# Patient Record
Sex: Male | Born: 1975 | Race: Black or African American | Hispanic: No | Smoking: Never smoker
Health system: Southern US, Community
[De-identification: ages and names within clinical notes are randomized; demographics above are authoritative.]

## PROBLEM LIST (undated history)

## (undated) DIAGNOSIS — E785 Hyperlipidemia, unspecified: Secondary | ICD-10-CM

## (undated) DIAGNOSIS — G4730 Sleep apnea, unspecified: Secondary | ICD-10-CM

## (undated) DIAGNOSIS — R0902 Hypoxemia: Secondary | ICD-10-CM

## (undated) DIAGNOSIS — I459 Conduction disorder, unspecified: Secondary | ICD-10-CM

## (undated) DIAGNOSIS — B019 Varicella without complication: Secondary | ICD-10-CM

## (undated) DIAGNOSIS — I509 Heart failure, unspecified: Secondary | ICD-10-CM

## (undated) DIAGNOSIS — I429 Cardiomyopathy, unspecified: Secondary | ICD-10-CM

## (undated) DIAGNOSIS — K219 Gastro-esophageal reflux disease without esophagitis: Secondary | ICD-10-CM

## (undated) DIAGNOSIS — I495 Sick sinus syndrome: Secondary | ICD-10-CM

## (undated) DIAGNOSIS — I5022 Chronic systolic (congestive) heart failure: Secondary | ICD-10-CM

## (undated) DIAGNOSIS — R0602 Shortness of breath: Secondary | ICD-10-CM

## (undated) DIAGNOSIS — G4733 Obstructive sleep apnea (adult) (pediatric): Secondary | ICD-10-CM

## (undated) DIAGNOSIS — E119 Type 2 diabetes mellitus without complications: Secondary | ICD-10-CM

## (undated) DIAGNOSIS — N179 Acute kidney failure, unspecified: Secondary | ICD-10-CM

## (undated) DIAGNOSIS — E876 Hypokalemia: Secondary | ICD-10-CM

## (undated) DIAGNOSIS — I10 Essential (primary) hypertension: Secondary | ICD-10-CM

## (undated) DIAGNOSIS — R51 Headache: Secondary | ICD-10-CM

## (undated) DIAGNOSIS — R000 Tachycardia, unspecified: Secondary | ICD-10-CM

## (undated) DIAGNOSIS — R519 Headache, unspecified: Secondary | ICD-10-CM

## (undated) HISTORY — PX: ABDOMINAL SURGERY: SHX537

## (undated) HISTORY — DX: Varicella without complication: B01.9

## (undated) HISTORY — DX: Sleep apnea, unspecified: G47.30

## (undated) HISTORY — DX: Hyperlipidemia, unspecified: E78.5

## (undated) HISTORY — PX: APPENDECTOMY: SHX54

---
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
## 2001-09-19 ENCOUNTER — Encounter: Payer: Self-pay | Admitting: Emergency Medicine

## 2001-09-19 ENCOUNTER — Emergency Department (HOSPITAL_COMMUNITY): Admission: EM | Admit: 2001-09-19 | Discharge: 2001-09-19 | Payer: Self-pay | Admitting: Emergency Medicine

## 2002-06-12 ENCOUNTER — Emergency Department (HOSPITAL_COMMUNITY): Admission: EM | Admit: 2002-06-12 | Discharge: 2002-06-12 | Payer: Self-pay | Admitting: Emergency Medicine

## 2003-10-15 ENCOUNTER — Emergency Department (HOSPITAL_COMMUNITY): Admission: EM | Admit: 2003-10-15 | Discharge: 2003-10-15 | Payer: Self-pay | Admitting: Emergency Medicine

## 2012-11-02 ENCOUNTER — Telehealth: Payer: Self-pay | Admitting: Neurology

## 2012-11-02 DIAGNOSIS — IMO0001 Reserved for inherently not codable concepts without codable children: Secondary | ICD-10-CM

## 2012-11-02 DIAGNOSIS — G4710 Hypersomnia, unspecified: Secondary | ICD-10-CM

## 2012-11-02 DIAGNOSIS — I10 Essential (primary) hypertension: Secondary | ICD-10-CM

## 2012-11-02 NOTE — Telephone Encounter (Signed)
This patient qualifies for attended sleep study based on clinical history. Witnessed apneas .  Patient will need a SPLIT at AHI 10 and 3% scoring.  Offer lunesta prn,  based on BMI  , measuring of CO2 necessary. CD

## 2012-11-02 NOTE — Telephone Encounter (Signed)
Dr. Alysia Penna is referring this patient for evaluation of sleep apnea.  Wt. 307 lbs. Ht. 71.75 in.  BMI 41.93  Morbid Obesity HTN Witnessed Apnea Hyperlipidemia  Medications Vitamin D 2000 Unit Caps Lisinopril 20 MG Glucophage 500 MG Pravastatin Sodium 40 MG Hydrochlorothiazide 25 MG Flagyl 500 MG  Dr. Link Snuffer requests patient be evaluated for sleep apnea.  He has witnessed episodes of apnea at night. He endorses Epworth at 15 and has  excessive daytime sleepiness/fatigue.   Insurance:  [employer]

## 2012-11-04 NOTE — Addendum Note (Signed)
Addended by: Melvyn Novas on: 11/04/2012 05:34 PM   Modules accepted: Orders

## 2012-11-08 ENCOUNTER — Telehealth: Payer: Self-pay | Admitting: *Deleted

## 2012-11-08 NOTE — Telephone Encounter (Signed)
Message left that we received a referral for a sleep study.  Please call to schedule.

## 2012-11-18 ENCOUNTER — Telehealth: Payer: Self-pay | Admitting: Neurology

## 2012-11-18 NOTE — Telephone Encounter (Signed)
Called the patient and left a message to call back regarding scheduling his sleep study appointment.

## 2013-01-28 ENCOUNTER — Encounter: Payer: Self-pay | Admitting: Neurology

## 2013-07-07 HISTORY — PX: CARDIAC CATHETERIZATION: SHX172

## 2013-07-22 ENCOUNTER — Emergency Department (HOSPITAL_BASED_OUTPATIENT_CLINIC_OR_DEPARTMENT_OTHER): Payer: Self-pay

## 2013-07-22 ENCOUNTER — Encounter (HOSPITAL_BASED_OUTPATIENT_CLINIC_OR_DEPARTMENT_OTHER): Payer: Self-pay | Admitting: Emergency Medicine

## 2013-07-22 ENCOUNTER — Inpatient Hospital Stay (HOSPITAL_BASED_OUTPATIENT_CLINIC_OR_DEPARTMENT_OTHER)
Admission: EM | Admit: 2013-07-22 | Discharge: 2013-07-28 | DRG: 287 | Disposition: A | Discharge status: Home / Self Care | Payer: Self-pay | Attending: Internal Medicine | Admitting: Internal Medicine

## 2013-07-22 DIAGNOSIS — I495 Sick sinus syndrome: Secondary | ICD-10-CM

## 2013-07-22 DIAGNOSIS — R0602 Shortness of breath: Secondary | ICD-10-CM

## 2013-07-22 DIAGNOSIS — I10 Essential (primary) hypertension: Secondary | ICD-10-CM

## 2013-07-22 DIAGNOSIS — I459 Conduction disorder, unspecified: Secondary | ICD-10-CM

## 2013-07-22 DIAGNOSIS — N179 Acute kidney failure, unspecified: Secondary | ICD-10-CM

## 2013-07-22 DIAGNOSIS — R0902 Hypoxemia: Secondary | ICD-10-CM

## 2013-07-22 DIAGNOSIS — E876 Hypokalemia: Secondary | ICD-10-CM

## 2013-07-22 DIAGNOSIS — R000 Tachycardia, unspecified: Secondary | ICD-10-CM

## 2013-07-22 DIAGNOSIS — I5021 Acute systolic (congestive) heart failure: Secondary | ICD-10-CM

## 2013-07-22 DIAGNOSIS — I509 Heart failure, unspecified: Secondary | ICD-10-CM | POA: Diagnosis present

## 2013-07-22 DIAGNOSIS — I110 Hypertensive heart disease with heart failure: Principal | ICD-10-CM | POA: Diagnosis present

## 2013-07-22 DIAGNOSIS — I429 Cardiomyopathy, unspecified: Secondary | ICD-10-CM

## 2013-07-22 DIAGNOSIS — E119 Type 2 diabetes mellitus without complications: Secondary | ICD-10-CM

## 2013-07-22 HISTORY — DX: Essential (primary) hypertension: I10

## 2013-07-22 LAB — CBC
HCT: 41.3 % (ref 39.0–52.0)
Hemoglobin: 13.6 g/dL (ref 13.0–17.0)
MCH: 30 pg (ref 26.0–34.0)
MCHC: 32.9 g/dL (ref 30.0–36.0)
MCV: 91 fL (ref 78.0–100.0)
PLATELETS: 238 10*3/uL (ref 150–400)
RBC: 4.54 MIL/uL (ref 4.22–5.81)
RDW: 13.1 % (ref 11.5–15.5)
WBC: 12.1 10*3/uL — AB (ref 4.0–10.5)

## 2013-07-22 LAB — COMPREHENSIVE METABOLIC PANEL
ALT: 43 U/L (ref 0–53)
AST: 30 U/L (ref 0–37)
Albumin: 3.9 g/dL (ref 3.5–5.2)
Alkaline Phosphatase: 65 U/L (ref 39–117)
BUN: 18 mg/dL (ref 6–23)
CHLORIDE: 100 meq/L (ref 96–112)
CO2: 27 meq/L (ref 19–32)
CREATININE: 1.4 mg/dL — AB (ref 0.50–1.35)
Calcium: 8.8 mg/dL (ref 8.4–10.5)
GFR calc Af Amer: 72 mL/min — ABNORMAL LOW (ref >90)
GFR, EST NON AFRICAN AMERICAN: 62 mL/min — AB (ref >90)
Glucose, Bld: 146 mg/dL — ABNORMAL HIGH (ref 70–99)
Potassium: 4 mEq/L (ref 3.7–5.3)
Sodium: 140 mEq/L (ref 137–147)
Total Bilirubin: 1.2 mg/dL (ref 0.3–1.2)
Total Protein: 7.6 g/dL (ref 6.0–8.3)

## 2013-07-22 LAB — PRO B NATRIURETIC PEPTIDE: Pro B Natriuretic peptide (BNP): 575.6 pg/mL — ABNORMAL HIGH (ref 0–125)

## 2013-07-22 LAB — TROPONIN I: Troponin I: <0.3 ng/mL (ref <0.30)

## 2013-07-22 MED ORDER — IOHEXOL 350 MG/ML SOLN: 100.0000 mL | Freq: Once | INTRAVENOUS | Status: AC | PRN

## 2013-07-22 MED ORDER — METOPROLOL TARTRATE 1 MG/ML IV SOLN
5.0000 mg | Freq: Once | INTRAVENOUS | Status: AC
  Administered 2013-07-22: 5 mg via INTRAVENOUS
  Filled 2013-07-22: qty 5

## 2013-07-22 NOTE — ED Notes (Signed)
Pt. Reports he vomited at 1700 today and reports feeling better for a while and then short of breath again.  Pt. Reports no diaphoresis.

## 2013-07-22 NOTE — ED Provider Notes (Signed)
CSN: [id number]     Arrival date & time 07/22/13  2147 History  This chart was scribed for Nelia Shi, MD by Blanchard Kelch, ED Scribe. The patient was seen in room MH10/MH10. Patient's care was started at 10:00 PM.    Chief Complaint  Patient presents with  . Shortness of Breath    Patient is a 38 y.o. male presenting with shortness of breath. The history is provided by the patient. No language interpreter was used.  Shortness of Breath   HPI Comments: Dwon Traub is a 38 y.o. male who presents to the Emergency Department complaining of constant, worsening shortness of breath that began about five hours ago. He was hypoxic at 89% on room air upon arriving to the ED. He denies any wheezing. He reports associated productive cough that began a few weeks ago as well as intermittent low grade fever that has since subsided. He had one episode of emesis about five hours ago. He also reports recent weight gain. He denies any leg swelling or fluid buildup. He did not receive a flu vaccination this season. He believes he has sleep apnea but has never officially been diagnosed with it. He takes medication, Lisinopril and two others, for hypertension. He also takes Metformin for borderline Diabetes. He has not been taking them compliantly for the past few days. He denies a history of blood clots.  His PCP is at [facility] on [facility].   Past Medical History  Diagnosis Date  . Hypertension    Past Surgical History  Procedure Laterality Date  . Appendectomy    . Abdominal surgery      tumor removed from abd. non milignant   Family History  Problem Relation Age of Onset  . Hypertension Mother   . CAD Mother   . Hypertension Father    History  Substance Use Topics  . Smoking status: Never Smoker   . Smokeless tobacco: Not on file  . Alcohol Use: Yes     Comment: occasionally    Review of SystemsA complete 10 system review of systems was obtained and all systems are  negative except as noted in the HPI and PMH.    Allergies  Review of patient's allergies indicates no known allergies.  Home Medications   No current outpatient prescriptions on file. Triage Vitals: 3BP 158/118  Pulse 123  Resp 17  Ht 6' (1.829 m)  Wt 302 lb (136.986 kg)  BMI 40.95 kg/m2  SpO2 98%  Physical Exam  Nursing note and vitals reviewed. Constitutional: He is oriented to person, place, and time. He appears well-developed and well-nourished. No distress.  HENT:  Head: Normocephalic and atraumatic.  Eyes: Pupils are equal, round, and reactive to light.  Neck: Normal range of motion.  Cardiovascular: Normal rate and intact distal pulses.   Pulmonary/Chest: No respiratory distress. He has no wheezes. He has no rales.  Abdominal: Normal appearance. He exhibits no distension. There is no tenderness. There is no rebound.  Musculoskeletal: Normal range of motion.  Neurological: He is alert and oriented to person, place, and time. No cranial nerve deficit.  Skin: Skin is warm and dry. No rash noted.  Psychiatric: He has a normal mood and affect. His behavior is normal.    ED Course  Procedures (including critical care time) DIAGNOSTIC STUDIES: Oxygen Saturation is 98% on [place], normal by my interpretation.    COORDINATION OF CARE: 10:07 PM -Will order Bmp, CMP, Troponin I, CBC, chest x-ray and 5 mg  injection Lopressor via IV. Patient verbalizes understanding and agrees with treatment plan.    Labs Review Labs Reviewed  COMPREHENSIVE METABOLIC PANEL - Abnormal; Notable for the following:    Glucose, Bld 146 (*)    Creatinine, Ser 1.40 (*)    GFR calc non Af Amer 62 (*)    GFR calc Af Amer 72 (*)    All other components within normal limits  CBC - Abnormal; Notable for the following:    WBC 12.1 (*)    All other components within normal limits  PRO B NATRIURETIC PEPTIDE - Abnormal; Notable for the following:    Pro B Natriuretic peptide (BNP) 575.6 (*)    All other  components within normal limits  HEMOGLOBIN A1C - Abnormal; Notable for the following:    Hemoglobin A1C 6.5 (*)    Mean Plasma Glucose 140 (*)    All other components within normal limits  COMPREHENSIVE METABOLIC PANEL - Abnormal; Notable for the following:    Glucose, Bld 117 (*)    Calcium 8.1 (*)    GFR calc non Af Amer 68 (*)    GFR calc Af Amer 79 (*)    All other components within normal limits  URINALYSIS, ROUTINE W REFLEX MICROSCOPIC - Abnormal; Notable for the following:    Specific Gravity, Urine 1.036 (*)    Hgb urine dipstick TRACE (*)    All other components within normal limits  GLUCOSE, CAPILLARY - Abnormal; Notable for the following:    Glucose-Capillary 102 (*)    All other components within normal limits  GLUCOSE, CAPILLARY - Abnormal; Notable for the following:    Glucose-Capillary 108 (*)    All other components within normal limits  GLUCOSE, CAPILLARY - Abnormal; Notable for the following:    Glucose-Capillary 100 (*)    All other components within normal limits  TSH - Abnormal; Notable for the following:    TSH 4.603 (*)    All other components within normal limits  BASIC METABOLIC PANEL - Abnormal; Notable for the following:    Potassium 3.2 (*)    Glucose, Bld 128 (*)    GFR calc non Af Amer 77 (*)    GFR calc Af Amer 89 (*)    All other components within normal limits  GLUCOSE, CAPILLARY - Abnormal; Notable for the following:    Glucose-Capillary 100 (*)    All other components within normal limits  CREATININE, SERUM - Abnormal; Notable for the following:    GFR calc non Af Amer 82 (*)    All other components within normal limits  BASIC METABOLIC PANEL - Abnormal; Notable for the following:    Glucose, Bld 103 (*)    Creatinine, Ser 1.53 (*)    GFR calc non Af Amer 56 (*)    GFR calc Af Amer 65 (*)    All other components within normal limits  CBC - Abnormal; Notable for the following:    WBC 10.7 (*)    RBC 4.13 (*)    Hemoglobin 12.5 (*)     HCT 38.0 (*)    All other components within normal limits  POCT I-STAT 3, BLOOD GAS (G3P V) - Abnormal; Notable for the following:    pH, Ven 7.403 (*)    Bicarbonate 28.9 (*)    Acid-Base Excess 3.0 (*)    All other components within normal limits  POCT I-STAT 3, BLOOD GAS (G3+) - Abnormal; Notable for the following:    pO2, Arterial 64.0 (*)  Bicarbonate 27.9 (*)    Acid-Base Excess 3.0 (*)    All other components within normal limits  RESPIRATORY VIRUS PANEL  URINE CULTURE  TROPONIN I  MAGNESIUM  PHOSPHORUS  TSH  CBC  TROPONIN I  TROPONIN I  TROPONIN I  D-DIMER, QUANTITATIVE  URINE MICROSCOPIC-ADD ON  GLUCOSE, CAPILLARY  GLUCOSE, CAPILLARY  GLUCOSE, CAPILLARY  INFLUENZA PANEL BY PCR (TYPE A & B, H1N1)  GLUCOSE, CAPILLARY  GLUCOSE, CAPILLARY  GLUCOSE, CAPILLARY  GLUCOSE, CAPILLARY  GLUCOSE, CAPILLARY  PROTIME-INR  T4, FREE  T3, FREE  MAGNESIUM  CBC  GLUCOSE, CAPILLARY  BASIC METABOLIC PANEL  POCT ACTIVATED CLOTTING TIME  POCT ACTIVATED CLOTTING TIME   Imaging Review No results found.  EKG Interpretation    Date/Time:  Friday July 22 2013 21:53:34 EST Ventricular Rate:  124 PR Interval:  152 QRS Duration: 90 QT Interval:  332 QTC Calculation: 476 R Axis:   25 Text Interpretation:  Sinus tachycardia Minimal voltage criteria for LVH, may be normal variant Septal infarct , age undetermined T wave abnormality, consider inferior ischemia Abnormal ECG No previous tracing Confirmed by Tamanna Whitson  MD, Melani Brisbane ([id number]) on 07/22/2013 10:12:26 PM            MDM   1. Shortness of breath   2. Hypoxia   3. Tachycardia   4. Type II or unspecified type diabetes mellitus without mention of complication, not stated as uncontrolled   5. Hypertension   6. SOB (shortness of breath)   7. Cardiomyopathy   8. Heart block   9. 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 Essential hypertension   12. AKI (acute kidney injury)      I personally performed the  services described in this documentation, which was scribed in my presence. The recorded information has been reviewed and considered.    Nelia Shiobert L Starlette Thurow, MD 07/27/13 2151

## 2013-07-23 ENCOUNTER — Encounter (HOSPITAL_COMMUNITY): Payer: Self-pay | Admitting: Internal Medicine

## 2013-07-23 ENCOUNTER — Inpatient Hospital Stay (HOSPITAL_COMMUNITY): Payer: Self-pay

## 2013-07-23 DIAGNOSIS — R0602 Shortness of breath: Secondary | ICD-10-CM | POA: Insufficient documentation

## 2013-07-23 DIAGNOSIS — R000 Tachycardia, unspecified: Secondary | ICD-10-CM | POA: Diagnosis present

## 2013-07-23 DIAGNOSIS — R0902 Hypoxemia: Secondary | ICD-10-CM

## 2013-07-23 DIAGNOSIS — E119 Type 2 diabetes mellitus without complications: Secondary | ICD-10-CM

## 2013-07-23 DIAGNOSIS — I10 Essential (primary) hypertension: Secondary | ICD-10-CM | POA: Diagnosis present

## 2013-07-23 HISTORY — DX: Shortness of breath: R06.02

## 2013-07-23 HISTORY — DX: Tachycardia, unspecified: R00.0

## 2013-07-23 LAB — URINALYSIS, ROUTINE W REFLEX MICROSCOPIC
Bilirubin Urine: NEGATIVE
Glucose, UA: NEGATIVE mg/dL
KETONES UR: NEGATIVE mg/dL
Leukocytes, UA: NEGATIVE
NITRITE: NEGATIVE
Protein, ur: NEGATIVE mg/dL
Specific Gravity, Urine: 1.036 — ABNORMAL HIGH (ref 1.005–1.030)
UROBILINOGEN UA: 1 mg/dL (ref 0.0–1.0)
pH: 5.5 (ref 5.0–8.0)

## 2013-07-23 LAB — CBC
HCT: 41.1 % (ref 39.0–52.0)
Hemoglobin: 13.6 g/dL (ref 13.0–17.0)
MCH: 30.6 pg (ref 26.0–34.0)
MCHC: 33.1 g/dL (ref 30.0–36.0)
MCV: 92.6 fL (ref 78.0–100.0)
PLATELETS: 239 10*3/uL (ref 150–400)
RBC: 4.44 MIL/uL (ref 4.22–5.81)
RDW: 13.7 % (ref 11.5–15.5)
WBC: 9.8 10*3/uL (ref 4.0–10.5)

## 2013-07-23 LAB — COMPREHENSIVE METABOLIC PANEL
ALT: 39 U/L (ref 0–53)
AST: 25 U/L (ref 0–37)
Albumin: 3.5 g/dL (ref 3.5–5.2)
Alkaline Phosphatase: 65 U/L (ref 39–117)
BUN: 18 mg/dL (ref 6–23)
CALCIUM: 8.1 mg/dL — AB (ref 8.4–10.5)
CO2: 24 meq/L (ref 19–32)
CREATININE: 1.3 mg/dL (ref 0.50–1.35)
Chloride: 101 mEq/L (ref 96–112)
GFR calc Af Amer: 79 mL/min — ABNORMAL LOW (ref >90)
GFR, EST NON AFRICAN AMERICAN: 68 mL/min — AB (ref >90)
Glucose, Bld: 117 mg/dL — ABNORMAL HIGH (ref 70–99)
Potassium: 4 mEq/L (ref 3.7–5.3)
Sodium: 139 mEq/L (ref 137–147)
Total Bilirubin: 1 mg/dL (ref 0.3–1.2)
Total Protein: 7.1 g/dL (ref 6.0–8.3)

## 2013-07-23 LAB — GLUCOSE, CAPILLARY
GLUCOSE-CAPILLARY: 102 mg/dL — AB (ref 70–99)
Glucose-Capillary: 95 mg/dL (ref 70–99)
Glucose-Capillary: 84 mg/dL (ref 70–99)

## 2013-07-23 LAB — TSH: TSH: 0.853 u[IU]/mL (ref 0.350–4.500)

## 2013-07-23 LAB — HEMOGLOBIN A1C
HEMOGLOBIN A1C: 6.5 % — AB (ref <5.7)
MEAN PLASMA GLUCOSE: 140 mg/dL — AB (ref <117)

## 2013-07-23 LAB — URINE MICROSCOPIC-ADD ON

## 2013-07-23 LAB — TROPONIN I

## 2013-07-23 LAB — MAGNESIUM: Magnesium: 1.8 mg/dL (ref 1.5–2.5)

## 2013-07-23 LAB — PHOSPHORUS: PHOSPHORUS: 3.2 mg/dL (ref 2.3–4.6)

## 2013-07-23 LAB — D-DIMER, QUANTITATIVE: D-Dimer, Quant: 0.31 ug/mL-FEU (ref 0.00–0.48)

## 2013-07-23 MED ORDER — INSULIN ASPART 100 UNIT/ML ~~LOC~~ SOLN: 0.0000 [IU] | SUBCUTANEOUS | Status: DC

## 2013-07-23 MED ORDER — OSELTAMIVIR PHOSPHATE 75 MG PO CAPS
75.0000 mg | ORAL_CAPSULE | Freq: Two times a day (BID) | ORAL | Status: DC
Start: 2013-07-23 — End: 2013-07-25
  Administered 2013-07-23 – 2013-07-24 (×4): 75 mg via ORAL
  Filled 2013-07-23 (×6): qty 1

## 2013-07-23 MED ORDER — SODIUM CHLORIDE 0.9 % IJ SOLN
3.0000 mL | Freq: Two times a day (BID) | INTRAMUSCULAR | Status: DC
  Administered 2013-07-24 – 2013-07-25 (×2): 3 mL via INTRAVENOUS

## 2013-07-23 MED ORDER — LABETALOL HCL 5 MG/ML IV SOLN
10.0000 mg | INTRAVENOUS | Status: DC | PRN
  Administered 2013-07-23 – 2013-07-24 (×2): 10 mg via INTRAVENOUS
  Filled 2013-07-23 (×2): qty 4

## 2013-07-23 MED ORDER — ENOXAPARIN SODIUM 80 MG/0.8ML ~~LOC~~ SOLN
70.0000 mg | SUBCUTANEOUS | Status: DC
  Administered 2013-07-25: 70 mg via SUBCUTANEOUS
  Filled 2013-07-23 (×3): qty 0.8

## 2013-07-23 MED ORDER — ACETAMINOPHEN 325 MG PO TABS
650.0000 mg | ORAL_TABLET | Freq: Four times a day (QID) | ORAL | Status: DC | PRN
  Administered 2013-07-23: 650 mg via ORAL
  Filled 2013-07-23: qty 2

## 2013-07-23 MED ORDER — TECHNETIUM TO 99M ALBUMIN AGGREGATED
6.0000 | Freq: Once | INTRAVENOUS | Status: AC | PRN
  Administered 2013-07-23: 6 via INTRAVENOUS

## 2013-07-23 MED ORDER — ACETAMINOPHEN 325 MG PO TABS: 650.0000 mg | ORAL_TABLET | Freq: Four times a day (QID) | ORAL | Status: DC | PRN

## 2013-07-23 MED ORDER — HYDROCODONE-ACETAMINOPHEN 5-325 MG PO TABS
1.0000 | ORAL_TABLET | ORAL | Status: DC | PRN
Start: 2013-07-23 — End: 2013-07-28
  Administered 2013-07-26: 1 via ORAL
  Administered 2013-07-26: 2 via ORAL
  Filled 2013-07-23: qty 1
  Filled 2013-07-23: qty 2

## 2013-07-23 MED ORDER — GUAIFENESIN ER 600 MG PO TB12
600.0000 mg | ORAL_TABLET | Freq: Two times a day (BID) | ORAL | Status: DC
  Administered 2013-07-23 – 2013-07-28 (×11): 600 mg via ORAL
  Filled 2013-07-23 (×12): qty 1

## 2013-07-23 MED ORDER — ACETAMINOPHEN 325 MG PO TABS
650.0000 mg | ORAL_TABLET | Freq: Once | ORAL | Status: AC
  Administered 2013-07-23: 650 mg via ORAL

## 2013-07-23 MED ORDER — ACETAMINOPHEN 325 MG PO TABS
ORAL_TABLET | ORAL | Status: AC
  Filled 2013-07-23: qty 2

## 2013-07-23 MED ORDER — DOCUSATE SODIUM 100 MG PO CAPS
100.0000 mg | ORAL_CAPSULE | Freq: Two times a day (BID) | ORAL | Status: DC
  Administered 2013-07-23 – 2013-07-28 (×8): 100 mg via ORAL
  Filled 2013-07-23 (×12): qty 1

## 2013-07-23 MED ORDER — ACETAMINOPHEN 650 MG RE SUPP: 650.0000 mg | Freq: Four times a day (QID) | RECTAL | Status: DC | PRN

## 2013-07-23 MED ORDER — LEVALBUTEROL HCL 0.63 MG/3ML IN NEBU: 0.6300 mg | INHALATION_SOLUTION | Freq: Four times a day (QID) | RESPIRATORY_TRACT | Status: DC | PRN

## 2013-07-23 MED ORDER — ONDANSETRON HCL 4 MG PO TABS: 4.0000 mg | ORAL_TABLET | Freq: Four times a day (QID) | ORAL | Status: DC | PRN

## 2013-07-23 MED ORDER — SODIUM CHLORIDE 0.9 % IV SOLN
INTRAVENOUS | Status: DC
  Administered 2013-07-23 – 2013-07-24 (×2): via INTRAVENOUS

## 2013-07-23 MED ORDER — ONDANSETRON HCL 4 MG/2ML IJ SOLN: 4.0000 mg | Freq: Four times a day (QID) | INTRAMUSCULAR | Status: DC | PRN

## 2013-07-23 MED ORDER — ZOLPIDEM TARTRATE 5 MG PO TABS
5.0000 mg | ORAL_TABLET | Freq: Every evening | ORAL | Status: DC | PRN
  Administered 2013-07-23 – 2013-07-27 (×4): 5 mg via ORAL
  Filled 2013-07-23 (×4): qty 1

## 2013-07-23 MED ORDER — ENOXAPARIN SODIUM 150 MG/ML ~~LOC~~ SOLN
1.0000 mg/kg | Freq: Two times a day (BID) | SUBCUTANEOUS | Status: DC
  Administered 2013-07-23: 145 mg via SUBCUTANEOUS
  Filled 2013-07-23 (×2): qty 1

## 2013-07-23 MED ORDER — ASPIRIN EC 81 MG PO TBEC
81.0000 mg | DELAYED_RELEASE_TABLET | Freq: Every day | ORAL | Status: DC
  Administered 2013-07-23 – 2013-07-28 (×6): 81 mg via ORAL
  Filled 2013-07-23 (×6): qty 1

## 2013-07-23 MED ORDER — TECHNETIUM TC 99M DIETHYLENETRIAME-PENTAACETIC ACID
40.0000 | Freq: Once | INTRAVENOUS | Status: AC | PRN
  Administered 2013-07-23: 40 via RESPIRATORY_TRACT

## 2013-07-23 MED ORDER — IOHEXOL 350 MG/ML SOLN
160.0000 mL | Freq: Once | INTRAVENOUS | Status: AC | PRN
  Administered 2013-07-23: 160 mL via INTRAVENOUS

## 2013-07-23 MED ORDER — SODIUM CHLORIDE 0.9 % IV SOLN
INTRAVENOUS | Status: DC
  Administered 2013-07-23: 1000 mL via INTRAVENOUS

## 2013-07-23 MED ORDER — INSULIN ASPART 100 UNIT/ML ~~LOC~~ SOLN: 0.0000 [IU] | Freq: Three times a day (TID) | SUBCUTANEOUS | Status: DC

## 2013-07-23 MED ORDER — METOCLOPRAMIDE HCL 5 MG/ML IJ SOLN
10.0000 mg | Freq: Once | INTRAMUSCULAR | Status: AC
  Administered 2013-07-23: 10 mg via INTRAVENOUS
  Filled 2013-07-23: qty 2

## 2013-07-23 MED ORDER — ACETAMINOPHEN 325 MG PO TABS
650.0000 mg | ORAL_TABLET | Freq: Once | ORAL | Status: AC
  Administered 2013-07-23: 650 mg via ORAL
  Filled 2013-07-23: qty 2

## 2013-07-23 MED ORDER — ENOXAPARIN SODIUM 150 MG/ML ~~LOC~~ SOLN
140.0000 mg | SUBCUTANEOUS | Status: AC
  Administered 2013-07-23: 140 mg via SUBCUTANEOUS
  Filled 2013-07-23: qty 1

## 2013-07-23 NOTE — H&P (Signed)
PCP: none   Chief Complaint:   Shortness of breath  HPI: Vernon Reed is a 38 y.o. male   has a past medical history of Hypertension.   Presented with  Sudden onset of shortness of breath followed by nausea and vomiting occurred while at work. He went home and felt fatigued denies any chest pain.  He felt chest tightness and went to ER. Patient states he has this episodes in the past but usually not as severe. Shortness of breath induced usually by overexertion. Denies ever having stress test. Denies wheezing. Reports family history of maternal Uncle with MI in his 74's. Currently states he is back to baseline. Patient reports bad cough for past few weeks productive of mucous. Denies recent sick contacts, no travel hx no leg swelling. Pateint presented to [facility] and was found to be hypoxic on RA requiring 2L o2, tachypneic and tachycardic to 120's. CXR -wnl Given high pretest probability for PE a CTA was ordered but was a poor study. Patient was empirically started on lovenox and [facility]. Upon arrival to [facility] he was found to be slightly febrile up to 100.2 Patient states that he feel currently back to baseline but remains tachycardic with HR up to 114  Review of Systems:   Pertinent positives include: fatigue, Fevers, headaches, shortness of breath at rest.dyspnea on exertion, non-productive cough,  nausea, vomiting  Constitutional:  No weight loss, night sweats,  chills,  weight loss  HEENT:  NoDifficulty swallowing,Tooth/dental problems,Sore throat,  No sneezing, itching, ear ache, nasal congestion, post nasal drip,  Cardio-vascular:  No chest pain, Orthopnea, PND, anasarca, dizziness, palpitations.no Bilateral lower extremity swelling  GI:  No heartburn, indigestion, abdominal pain,, diarrhea, change in bowel habits, loss of appetite, melena, blood in stool, hematemesis Resp:    No excess mucus, no productive cough, No  No coughing up of blood.No change in color of mucus.No  wheezing. Skin:  no rash or lesions. No jaundice GU:  no dysuria, change in color of urine, no urgency or frequency. No straining to urinate.  No flank pain.  Musculoskeletal:  No joint pain or no joint swelling. No decreased range of motion. No back pain.  Psych:  No change in mood or affect. No depression or anxiety. No memory loss.  Neuro: no localizing neurological complaints, no tingling, no weakness, no double vision, no gait abnormality, no slurred speech, no confusion  Otherwise ROS are negative except for above, 10 systems were reviewed  Past Medical History: Past Medical History  Diagnosis Date  . Hypertension    Past Surgical History  Procedure Laterality Date  . Appendectomy    . Abdominal surgery      tumor removed from abd. non milignant     Medications: Prior to Admission medications   Medication Sig Start Date End Date Taking? Authorizing Provider  lisinopril (PRINIVIL,ZESTRIL) 20 MG tablet Take 20 mg by mouth daily.   Yes Historical Provider, MD  metFORMIN (GLUCOPHAGE) 500 MG tablet Take by mouth 2 (two) times daily with a meal.   Yes Historical Provider, MD    Allergies:  No Known Allergies  Social History:  Ambulatory  independently   Lives at   Home with family   reports that he has never smoked. He does not have any smokeless tobacco history on file. He reports that he drinks alcohol. He reports that he does not use illicit drugs.   Family History: family history includes CAD in his mother; Hypertension in his father and  mother.    Physical Exam: Patient Vitals for the past 24 hrs:  BP Temp Temp src Pulse Resp SpO2 Height Weight  07/23/13 0404 161/113 mmHg 100.2 F (37.9 C) Oral 125 18 96 % 6' (1.829 m) 142.566 kg (314 lb 4.8 oz)  07/23/13 0259 146/90 mmHg 99.8 F (37.7 C) Oral 122 31 97 % - -  07/23/13 0143 153/99 mmHg 99.5 F (37.5 C) Oral 118 26 97 % - -  07/22/13 2300 163/118 mmHg - - 110 20 97 % - -  07/22/13 2225 170/110 mmHg - -  105 23 96 % - -  07/22/13 2152 - - - - - 98 % - -  07/22/13 2151 158/118 mmHg - - 123 17 98 % 6' (1.829 m) 136.986 kg (302 lb)    1. General:  in No Acute distress 2. Psychological: Alert and   Oriented 3. Head/ENT:   Moist  Mucous Membranes                          Head Non traumatic, neck supple                          Normal  Dentition 4. SKIN: normal   Skin turgor,  Skin clean Dry and intact no rash 5. Heart: Regular rate and rhythm no Murmur, Rub or gallop 6. Lungs: Clear to auscultation bilaterally, no wheezes or crackles   7. Abdomen: Soft, non-tender, Non distended, obese 8. Lower extremities: no clubbing, cyanosis, or edema 9. Neurologically Grossly intact, moving all 4 extremities equally 10. MSK: Normal range of motion  body mass index is 42.62 kg/(m^2).   Labs on Admission:   Recent Labs  07/22/13 2210  NA 140  K 4.0  CL 100  CO2 27  GLUCOSE 146*  BUN 18  CREATININE 1.40*  CALCIUM 8.8    Recent Labs  07/22/13 2210  AST 30  ALT 43  ALKPHOS 65  BILITOT 1.2  PROT 7.6  ALBUMIN 3.9   No results found for this basename: LIPASE, AMYLASE,  in the last 72 hours  Recent Labs  07/22/13 2210  WBC 12.1*  HGB 13.6  HCT 41.3  MCV 91.0  PLT 238    Recent Labs  07/22/13 2210  TROPONINI <0.30   No results found for this basename: TSH, T4TOTAL, FREET3, T3FREE, THYROIDAB,  in the last 72 hours No results found for this basename: VITAMINB12, FOLATE, FERRITIN, TIBC, IRON, RETICCTPCT,  in the last 72 hours No results found for this basename: HGBA1C    Estimated Creatinine Clearance: 104.8 ml/min (by C-G formula based on Cr of 1.4). ABG No results found for this basename: phart, pco2, po2, hco3, tco2, acidbasedef, o2sat     No results found for this basename: DDIMER     Other results:  I have pearsonaly reviewed this: ECG REPORT  Rate: 114  Rhythm: sinus tachy ST&T Change: T wave inversions and S waves noted in inferior leads.    BNP 575.6  (H)   Cultures: No results found for this basename: sdes, specrequest, cult, reptstatus       Radiological Exams on Admission: Dg Chest 2 View  07/22/2013   CLINICAL DATA:  Shortness of breath.  EXAM: CHEST  2 VIEW  COMPARISON:  Chest radiograph performed 10/15/2003  FINDINGS: The lungs are well-aerated and clear. There is no evidence of focal opacification, pleural effusion or pneumothorax.  The heart  is normal in size; the mediastinal contour is within normal limits. No acute osseous abnormalities are seen.  IMPRESSION: No acute cardiopulmonary process seen.   Electronically Signed   By: Roanna RaiderJeffery  Chang M.D.   On: 07/22/2013 23:13   Ct Angio Chest Pe W/cm &/or Wo Cm  07/23/2013   CLINICAL DATA:  Shortness of breath, hypoxia, low-grade fever, productive cough  EXAM: CT ANGIOGRAPHY CHEST WITH CONTRAST  TECHNIQUE: Multidetector CT imaging of the chest was performed using the standard protocol during bolus administration of intravenous contrast. Multiplanar CT image reconstructions including MIPs were obtained to evaluate the vascular anatomy.  CONTRAST:  160mL OMNIPAQUE IOHEXOL 350 MG/ML SOLN  COMPARISON:  Chest radiographs dated 07/22/2013  FINDINGS: Markedly suboptimal evaluation of the pulmonary arteries secondary to poor bolus timing. At the time of imaging, the vast majority of contrast was in the aorta. Study was subsequently repeated without appreciable improvement.  Although there is no central/saddle embolus, pulmonary embolism cannot be excluded starting with the lobar pulmonary arteries.  The lungs are clear. No suspicious pulmonary nodules. No focal consolidation. No interstitial edema. No pleural effusion or pneumothorax.  Visualized thyroid is unremarkable.  The heart is normal in size.  No pericardial effusion.  No suspicious mediastinal, hilar, or axillary lymphadenopathy.  Visualized upper abdomen is notable for mild hepatic steatosis.  Visualized osseous structures are within normal  limits.  Review of the MIP images confirms the above findings.  IMPRESSION: Markedly suboptimal evaluation of the pulmonary arteries secondary to poor bolus timing.  No evidence of central/saddle pulmonary embolus. Remainder of the pulmonary arteries cannot be adequately evaluated.  No evidence of pneumonia or interstitial edema.  These results were called by telephone at the time of interpretation on 07/23/2013 at 12:54 AM to Dr. Cy BlamerApril Palumbo, who verbally acknowledged these results.   Electronically Signed   By: Charline BillsSriyesh  Krishnan M.D.   On: 07/23/2013 00:59    Chart has been reviewed  Assessment/Plan  38 yo M with dyspnea and hypoxia worrisome for PE vs angina  Present on Admission:  . Hypoxia  - worriosome for PE, no hx of pulmonary disease, CXR unremarcable, inconclusive CTA, will order VQ scan in AM, 2D echo, for now continue lovenox . Tachycardia - possible PE, also low grade fever and possible dehydration, will rehydrate, treat fever . Shortness of breath - evaluate for PE, cycle CE, serial ECG, risk stratify with lipid panel. If negative for PE would benefit from cardiac work up given risk factors.  . Hypertension - hold lisinopril while slightly elevated Cr. Not sure what is his baseline.  . Type II or unspecified type diabetes mellitus without mention of complication, not stated as uncontrolled - hold metformin, ssi Low grade fever - influenza PCR given cough and shortness of breath with hypoxia will cover with tamiflu empericaly, order UA  Prophylaxis:  Lovenox   CODE STATUS: FULL CODE  Other plan as per orders.  I have spent a total of 55 min on this admission  Johnay Mano 07/23/2013, 4:21 AM

## 2013-07-23 NOTE — Progress Notes (Signed)
[facility] PROGRESS NOTE Assessment/Plan:  Shortness of breath - No hypoxia, influenza PCR pending, covered empirically with tamiflu. - Mild temp on 1.16.2014. Leukocytosis resolved. - V/Q scan negative for PE.   Tachycardia: - due to intravascular depletion and fevers. improving  Hypertension  Type II or unspecified type diabetes mellitus without mention of complication, not stated as uncontrolled - stable. - resume metformin as an outpatient.   Code Status: full Family Communication: wife  Disposition Plan: inpatinet   Consultants:  none  Procedures:  V/Q scan  Antibiotics:  Tamiflu  HPI/Subjective: Still SOB and weak, SOM improved compared to previous days.  Objective: Filed Vitals:   07/23/13 0816 07/23/13 0818 07/23/13 0819 07/23/13 0823  BP: 163/102 165/119 181/138 197/131  Pulse: 110 122 124 122  Temp: 99.1 F (37.3 C)     TempSrc: Oral     Resp: 20     Height:      Weight:      SpO2: 95%       Intake/Output Summary (Last 24 hours) at 07/23/13 1124 Last data filed at 07/23/13 0900  Gross per 24 hour  Intake      0 ml  Output    500 ml  Net   -500 ml   Filed Weights   07/22/13 2151 07/23/13 0404  Weight: 136.986 kg (302 lb) 142.566 kg (314 lb 4.8 oz)    Exam:  General: Alert, awake, oriented x3, in no acute distress.  HEENT: No bruits, no goiter.  Heart: Regular rate and rhythm, without murmurs, rubs, gallops.  Lungs: Good air movement, clear to auscultation. Abdomen: Soft, nontender, nondistended, positive bowel sounds.     Data Reviewed: Basic Metabolic Panel:  Recent Labs Lab 07/22/13 2210 07/23/13 0512  NA 140 139  K 4.0 4.0  CL 100 101  CO2 27 24  GLUCOSE 146* 117*  BUN 18 18  CREATININE 1.40* 1.30  CALCIUM 8.8 8.1*  MG  --  1.8  PHOS  --  3.2   Liver Function Tests:  Recent Labs Lab 07/22/13 2210 07/23/13 0512  AST 30 25  ALT 43 39  ALKPHOS 65 65  BILITOT 1.2 1.0  PROT 7.6 7.1  ALBUMIN 3.9 3.5    No results found for this basename: LIPASE, AMYLASE,  in the last 168 hours No results found for this basename: AMMONIA,  in the last 168 hours CBC:  Recent Labs Lab 07/22/13 2210 07/23/13 0512  WBC 12.1* 9.8  HGB 13.6 13.6  HCT 41.3 41.1  MCV 91.0 92.6  PLT 238 239   Cardiac Enzymes:  Recent Labs Lab 07/22/13 2210 07/23/13 0442  TROPONINI <0.30 <0.30   BNP (last 3 results)  Recent Labs  07/22/13 2210  PROBNP 575.6*   CBG: No results found for this basename: GLUCAP,  in the last 168 hours  No results found for this or any previous visit (from the past 240 hour(s)).   Studies: Dg Chest 2 View  07/22/2013   CLINICAL DATA:  Shortness of breath.  EXAM: CHEST  2 VIEW  COMPARISON:  Chest radiograph performed 10/15/2003  FINDINGS: The lungs are well-aerated and clear. There is no evidence of focal opacification, pleural effusion or pneumothorax.  The heart is normal in size; the mediastinal contour is within normal limits. No acute osseous abnormalities are seen.  IMPRESSION: No acute cardiopulmonary process seen.   Electronically Signed   By: Roanna Raider M.D.   On: 07/22/2013 23:13   Ct Angio Chest Pe  W/cm &/or Wo Cm  07/23/2013   CLINICAL DATA:  Shortness of breath, hypoxia, low-grade fever, productive cough  EXAM: CT ANGIOGRAPHY CHEST WITH CONTRAST  TECHNIQUE: Multidetector CT imaging of the chest was performed using the standard protocol during bolus administration of intravenous contrast. Multiplanar CT image reconstructions including MIPs were obtained to evaluate the vascular anatomy.  CONTRAST:  160mL OMNIPAQUE IOHEXOL 350 MG/ML SOLN  COMPARISON:  Chest radiographs dated 07/22/2013  FINDINGS: Markedly suboptimal evaluation of the pulmonary arteries secondary to poor bolus timing. At the time of imaging, the vast majority of contrast was in the aorta. Study was subsequently repeated without appreciable improvement.  Although there is no central/saddle embolus, pulmonary  embolism cannot be excluded starting with the lobar pulmonary arteries.  The lungs are clear. No suspicious pulmonary nodules. No focal consolidation. No interstitial edema. No pleural effusion or pneumothorax.  Visualized thyroid is unremarkable.  The heart is normal in size.  No pericardial effusion.  No suspicious mediastinal, hilar, or axillary lymphadenopathy.  Visualized upper abdomen is notable for mild hepatic steatosis.  Visualized osseous structures are within normal limits.  Review of the MIP images confirms the above findings.  IMPRESSION: Markedly suboptimal evaluation of the pulmonary arteries secondary to poor bolus timing.  No evidence of central/saddle pulmonary embolus. Remainder of the pulmonary arteries cannot be adequately evaluated.  No evidence of pneumonia or interstitial edema.  These results were called by telephone at the time of interpretation on 07/23/2013 at 12:54 AM to Dr. Cy BlamerApril Palumbo, who verbally acknowledged these results.   Electronically Signed   By: Charline BillsSriyesh  Krishnan M.D.   On: 07/23/2013 00:59   Nm Pulmonary Perf And Vent  07/23/2013   CLINICAL DATA:  Shortness of breath.  Hypoxia.  EXAM: NUCLEAR MEDICINE VENTILATION - PERFUSION LUNG SCAN  TECHNIQUE: Ventilation images were obtained in multiple projections using inhaled aerosol technetium 99 M DTPA. Perfusion images were obtained in multiple projections after intravenous injection of Tc-4218m MAA.  COMPARISON:  Chest radiograph on 07/22/2013  RADIOPHARMACEUTICALS:  40 mCi Tc-1618m DTPA aerosol and 6 mCi Tc-3818m MAA  FINDINGS: Ventilation: No focal ventilation defect.  Perfusion: No wedge shaped peripheral perfusion defects to suggest acute pulmonary embolism.  IMPRESSION: Normal study.  No signs of pulmonary embolism.   Electronically Signed   By: Myles RosenthalJohn  Stahl M.D.   On: 07/23/2013 10:17    Scheduled Meds: . aspirin EC  81 mg Oral Daily  . docusate sodium  100 mg Oral BID  . enoxaparin (LOVENOX) injection  1 mg/kg  Subcutaneous Q12H  . guaiFENesin  600 mg Oral BID  . insulin aspart  0-9 Units Subcutaneous Q4H  . oseltamivir  75 mg Oral BID  . sodium chloride  3 mL Intravenous Q12H   Continuous Infusions: . sodium chloride 100 mL/hr at 07/23/13 0527     Marinda ElkFELIZ ORTIZ, Vernessa Likes  [facility] Pager [phone]. If 8PM-8AM, please contact night-coverage at [email or web address], password [email or web address] 07/23/2013, 11:24 AM  LOS: 1 day

## 2013-07-23 NOTE — Progress Notes (Signed)
ANTICOAGULATION CONSULT NOTE - Initial Consult  Pharmacy Consult for lovenox Indication: VTE prophylaxis  No Known Allergies  Patient Measurements: Height: 6' (182.9 cm) Weight: 314 lb 4.8 oz (142.566 kg) IBW/kg (Calculated) : 77.6 Heparin Dosing Weight:   Vital Signs: Temp: 99.1 F (37.3 C) (01/17 0816) Temp src: Oral (01/17 0816) BP: 197/131 mmHg (01/17 0823) Pulse Rate: 122 (01/17 0823)  Labs:  Recent Labs  07/22/13 2210 07/23/13 0442 07/23/13 0512 07/23/13 1110  HGB 13.6  --  13.6  --   HCT 41.3  --  41.1  --   PLT 238  --  239  --   CREATININE 1.40*  --  1.30  --   TROPONINI <0.30 <0.30  --  <0.30    Estimated Creatinine Clearance: 112.9 ml/min (by C-G formula based on Cr of 1.3).   Medical History: Past Medical History  Diagnosis Date  . Hypertension     Medications:  Prescriptions prior to admission  Medication Sig Dispense Refill  . lisinopril (PRINIVIL,ZESTRIL) 20 MG tablet Take 20 mg by mouth daily.      . metFORMIN (GLUCOPHAGE) 500 MG tablet Take by mouth 2 (two) times daily with a meal.       Scheduled:  . aspirin EC  81 mg Oral Daily  . docusate sodium  100 mg Oral BID  . [START ON 07/24/2013] enoxaparin (LOVENOX) injection  70 mg Subcutaneous Q24H  . guaiFENesin  600 mg Oral BID  . insulin aspart  0-9 Units Subcutaneous Q4H  . oseltamivir  75 mg Oral BID  . sodium chloride  3 mL Intravenous Q12H    Assessment: Pt came in with SOB. Was being r/o for PE. CT and V/Q didn't show PE. Ok to change lovenox to prophylaxis dose  Goal of Therapy:  Heparin level 0.3-0.6 units/ml Monitor platelets by anticoagulation protocol: Yes   Plan:   Change lovenox to 70mg  SQ q24 F/u CBC

## 2013-07-23 NOTE — Progress Notes (Signed)
Placed patient on overnight oximetry with oxygen set at 2lpm

## 2013-07-24 LAB — RESPIRATORY VIRUS PANEL
ADENOVIRUS: NOT DETECTED
INFLUENZA A: NOT DETECTED
Influenza A H1: NOT DETECTED
Influenza A H3: NOT DETECTED
Influenza B: NOT DETECTED
Metapneumovirus: NOT DETECTED
PARAINFLUENZA 3 A: NOT DETECTED
Parainfluenza 1: NOT DETECTED
Parainfluenza 2: NOT DETECTED
Respiratory Syncytial Virus A: NOT DETECTED
Respiratory Syncytial Virus B: NOT DETECTED
Rhinovirus: NOT DETECTED

## 2013-07-24 LAB — GLUCOSE, CAPILLARY
GLUCOSE-CAPILLARY: 98 mg/dL (ref 70–99)
Glucose-Capillary: 108 mg/dL — ABNORMAL HIGH (ref 70–99)
Glucose-Capillary: 87 mg/dL (ref 70–99)
Glucose-Capillary: 76 mg/dL (ref 70–99)
Glucose-Capillary: 93 mg/dL (ref 70–99)

## 2013-07-24 LAB — INFLUENZA PANEL BY PCR (TYPE A & B)
H1N1FLUPCR: NOT DETECTED
INFLAPCR: NEGATIVE
INFLBPCR: NEGATIVE

## 2013-07-24 NOTE — Progress Notes (Signed)
Spoke with pt about BP control . Need to change his lifestyle and also to loss weight and exercise more. He is also instructed to make sure he is very compliant with his medication

## 2013-07-24 NOTE — Progress Notes (Signed)
[facility] PROGRESS NOTE Assessment/Plan:  Shortness of breath - No hypoxia, influenza PCR pending, covered empirically with tamiflu. - Mild temp on 1.16.2014. Leukocytosis resolved. - V/Q scan negative for PE.   Tachycardia: - Resolved. - Due to intravascular depletion and fevers. improving  Hypertension  Type II or unspecified type diabetes mellitus without mention of complication, not stated as uncontrolled - stable. - resume metformin as an outpatient.   Code Status: full Family Communication: wife  Disposition Plan: inpatinet   Consultants:  none  Procedures:  V/Q scan  Antibiotics:  Tamiflu  HPI/Subjective: Still SOB and weak, SOM improved compared to previous days.  Objective: Filed Vitals:   07/23/13 1820 07/23/13 2000 07/24/13 0000 07/24/13 0400  BP: 159/111 168/103 161/105 174/115  Pulse: 103 93 89 98  Temp:  98.5 F (36.9 C)  98.9 F (37.2 C)  TempSrc:    Oral  Resp:  16 17 17   Height:      Weight:      SpO2:  98% 98% 94%    Intake/Output Summary (Last 24 hours) at 07/24/13 0957 Last data filed at 07/23/13 1800  Gross per 24 hour  Intake    600 ml  Output      0 ml  Net    600 ml   Filed Weights   07/22/13 2151 07/23/13 0404  Weight: 136.986 kg (302 lb) 142.566 kg (314 lb 4.8 oz)    Exam:  General: Alert, awake, oriented x3, in no acute distress.  HEENT: No bruits, no goiter.  Heart: Regular rate and rhythm, without murmurs, rubs, gallops.  Lungs: Good air movement, clear to auscultation. Abdomen: Soft, nontender, nondistended, positive bowel sounds.     Data Reviewed: Basic Metabolic Panel:  Recent Labs Lab 07/22/13 2210 07/23/13 0512  NA 140 139  K 4.0 4.0  CL 100 101  CO2 27 24  GLUCOSE 146* 117*  BUN 18 18  CREATININE 1.40* 1.30  CALCIUM 8.8 8.1*  MG  --  1.8  PHOS  --  3.2   Liver Function Tests:  Recent Labs Lab 07/22/13 2210 07/23/13 0512  AST 30 25  ALT 43 39  ALKPHOS 65 65  BILITOT 1.2  1.0  PROT 7.6 7.1  ALBUMIN 3.9 3.5   No results found for this basename: LIPASE, AMYLASE,  in the last 168 hours No results found for this basename: AMMONIA,  in the last 168 hours CBC:  Recent Labs Lab 07/22/13 2210 07/23/13 0512  WBC 12.1* 9.8  HGB 13.6 13.6  HCT 41.3 41.1  MCV 91.0 92.6  PLT 238 239   Cardiac Enzymes:  Recent Labs Lab 07/22/13 2210 07/23/13 0442 07/23/13 1110 07/23/13 1605  TROPONINI <0.30 <0.30 <0.30 <0.30   BNP (last 3 results)  Recent Labs  07/22/13 2210  PROBNP 575.6*   CBG:  Recent Labs Lab 07/23/13 0810 07/23/13 1157 07/23/13 1642 07/23/13 2100 07/24/13 0816  GLUCAP 108* 102* 95 84 98    No results found for this or any previous visit (from the past 240 hour(s)).   Studies: Dg Chest 2 View  07/22/2013   CLINICAL DATA:  Shortness of breath.  EXAM: CHEST  2 VIEW  COMPARISON:  Chest radiograph performed 10/15/2003  FINDINGS: The lungs are well-aerated and clear. There is no evidence of focal opacification, pleural effusion or pneumothorax.  The heart is normal in size; the mediastinal contour is within normal limits. No acute osseous abnormalities are seen.  IMPRESSION: No acute cardiopulmonary process seen.  Electronically Signed   By: Roanna Raider M.D.   On: 07/22/2013 23:13   Ct Angio Chest Pe W/cm &/or Wo Cm  07/23/2013   CLINICAL DATA:  Shortness of breath, hypoxia, low-grade fever, productive cough  EXAM: CT ANGIOGRAPHY CHEST WITH CONTRAST  TECHNIQUE: Multidetector CT imaging of the chest was performed using the standard protocol during bolus administration of intravenous contrast. Multiplanar CT image reconstructions including MIPs were obtained to evaluate the vascular anatomy.  CONTRAST:  OMNIPAQUE IOHEXOL 350 MG/ML SOLN  COMPARISON:  Chest radiographs dated 07/22/2013  FINDINGS: Markedly suboptimal evaluation of the pulmonary arteries secondary to poor bolus timing. At the time of imaging, the vast majority of contrast  was in the aorta. Study was subsequently repeated without appreciable improvement.  Although there is no central/saddle embolus, pulmonary embolism cannot be excluded starting with the lobar pulmonary arteries.  The lungs are clear. No suspicious pulmonary nodules. No focal consolidation. No interstitial edema. No pleural effusion or pneumothorax.  Visualized thyroid is unremarkable.  The heart is normal in size.  No pericardial effusion.  No suspicious mediastinal, hilar, or axillary lymphadenopathy.  Visualized upper abdomen is notable for mild hepatic steatosis.  Visualized osseous structures are within normal limits.  Review of the MIP images confirms the above findings.  IMPRESSION: Markedly suboptimal evaluation of the pulmonary arteries secondary to poor bolus timing.  No evidence of central/saddle pulmonary embolus. Remainder of the pulmonary arteries cannot be adequately evaluated.  No evidence of pneumonia or interstitial edema.  These results were called by telephone at the time of interpretation on 07/23/2013 at 12:54 AM to Dr. Cy Blamer, who verbally acknowledged these results.   Electronically Signed   By: Charline Bills M.D.   On: 07/23/2013 00:59   Nm Pulmonary Perf And Vent  07/23/2013   CLINICAL DATA:  Shortness of breath.  Hypoxia.  EXAM: NUCLEAR MEDICINE VENTILATION - PERFUSION LUNG SCAN  TECHNIQUE: Ventilation images were obtained in multiple projections using inhaled aerosol technetium 99 M DTPA. Perfusion images were obtained in multiple projections after intravenous injection of Tc-29m MAA.  COMPARISON:  Chest radiograph on 07/22/2013  RADIOPHARMACEUTICALS:  40 mCi Tc-43m DTPA aerosol and 6 mCi Tc-72m MAA  FINDINGS: Ventilation: No focal ventilation defect.  Perfusion: No wedge shaped peripheral perfusion defects to suggest acute pulmonary embolism.  IMPRESSION: Normal study.  No signs of pulmonary embolism.   Electronically Signed   By: Myles Rosenthal M.D.   On: 07/23/2013 10:17     Scheduled Meds: . aspirin EC  81 mg Oral Daily  . docusate sodium  100 mg Oral BID  . enoxaparin (LOVENOX) injection  70 mg Subcutaneous Q24H  . guaiFENesin  600 mg Oral BID  . insulin aspart  0-9 Units Subcutaneous TID WC  . oseltamivir  75 mg Oral BID  . sodium chloride  3 mL Intravenous Q12H   Continuous Infusions: . sodium chloride 100 mL/hr at 07/24/13 0022     Marinda Elk  [facility] Pager [phone]. If 8PM-8AM, please contact night-coverage at [email or web address], password [facility] 07/24/2013, 9:57 AM  LOS: 2 days

## 2013-07-25 DIAGNOSIS — I429 Cardiomyopathy, unspecified: Secondary | ICD-10-CM

## 2013-07-25 DIAGNOSIS — I459 Conduction disorder, unspecified: Secondary | ICD-10-CM | POA: Diagnosis not present

## 2013-07-25 DIAGNOSIS — I428 Other cardiomyopathies: Secondary | ICD-10-CM

## 2013-07-25 HISTORY — DX: Conduction disorder, unspecified: I45.9

## 2013-07-25 HISTORY — DX: Cardiomyopathy, unspecified: I42.9

## 2013-07-25 LAB — TSH: TSH: 4.603 u[IU]/mL — AB (ref 0.350–4.500)

## 2013-07-25 LAB — URINE CULTURE: Colony Count: 10000

## 2013-07-25 LAB — GLUCOSE, CAPILLARY
Glucose-Capillary: 100 mg/dL — ABNORMAL HIGH (ref 70–99)
Glucose-Capillary: 88 mg/dL (ref 70–99)
Glucose-Capillary: 100 mg/dL — ABNORMAL HIGH (ref 70–99)
Glucose-Capillary: 88 mg/dL (ref 70–99)

## 2013-07-25 MED ORDER — SIMVASTATIN 20 MG PO TABS
20.0000 mg | ORAL_TABLET | Freq: Every day | ORAL | Status: DC
  Administered 2013-07-25 – 2013-07-27 (×3): 20 mg via ORAL
  Filled 2013-07-25 (×5): qty 1

## 2013-07-25 MED ORDER — ASPIRIN 81 MG PO CHEW
81.0000 mg | CHEWABLE_TABLET | ORAL | Status: AC
  Administered 2013-07-26: 81 mg via ORAL
  Filled 2013-07-25: qty 1

## 2013-07-25 MED ORDER — HYDROCHLOROTHIAZIDE 25 MG PO TABS
25.0000 mg | ORAL_TABLET | Freq: Every day | ORAL | Status: DC
  Administered 2013-07-25 – 2013-07-26 (×2): 25 mg via ORAL
  Filled 2013-07-25 (×3): qty 1

## 2013-07-25 MED ORDER — SODIUM CHLORIDE 0.9 % IV SOLN: 250.0000 mL | INTRAVENOUS | Status: DC | PRN

## 2013-07-25 MED ORDER — SODIUM CHLORIDE 0.9 % IJ SOLN: 3.0000 mL | INTRAMUSCULAR | Status: DC | PRN

## 2013-07-25 MED ORDER — LISINOPRIL 20 MG PO TABS
20.0000 mg | ORAL_TABLET | Freq: Every day | ORAL | Status: DC
  Administered 2013-07-25: 20 mg via ORAL
  Filled 2013-07-25 (×2): qty 1

## 2013-07-25 MED ORDER — HYDRALAZINE HCL 20 MG/ML IJ SOLN
10.0000 mg | Freq: Four times a day (QID) | INTRAMUSCULAR | Status: DC | PRN
  Administered 2013-07-25: 10 mg via INTRAVENOUS
  Filled 2013-07-25 (×2): qty 1

## 2013-07-25 MED ORDER — SODIUM CHLORIDE 0.9 % IJ SOLN
3.0000 mL | Freq: Two times a day (BID) | INTRAMUSCULAR | Status: DC
  Administered 2013-07-25: 3 mL via INTRAVENOUS

## 2013-07-25 NOTE — Consult Note (Signed)
CARDIOLOGY CONSULT NOTE       Patient ID: Vernon Reed MRN: [id number] DOB/AGE: [birth year] 39 y.o.  Admit date: 07/22/2013 Referring Physician:  [facility] Primary Physician: No primary provider on file. 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  He does not take good care of himself and is non compliant with BP meds.  Sees [facility] 6 months ago.  Denies SSCP.  Some LE edema.  No PND/Orthopnea  Sudden onset of shortness of breath followed by nausea and vomiting occurred while at work. He went home and felt fatigued denies any chest pain. He felt chest tightness and went to ER. Patient states he has this episodes in the past but usually not as severe. Shortness of breath induced usually by overexertion. Denies ever having stress test. Denies wheezing. Reports family history of maternal Uncle with MI in his 10's. Currently states he is back to baseline.  Has r/o  Echo showed EF 30-35%   Telemetry with periods of high grade AV block 2.5 second pause Asymptomatic  Patient indicates he feels his heart beating rapidly with exertion and dyspnea but no presyncope or palpitations   Labs abnormal with BNP 565  A1c 6.5   CT with no pneumonia or PE but degraded study F/U V/Q normal    ROS All other systems reviewed and negative except as noted above  Past Medical History  Diagnosis Date  . Hypertension     Family History  Problem Relation Age of Onset  . Hypertension Mother   . CAD Mother   . Hypertension Father     History   Social History  . Marital Status: Single    Spouse Name: N/A    Number of Children: N/A  . Years of Education: N/A   Occupational History  . Not on file.   Social  History Main Topics  . Smoking status: Never Smoker   . Smokeless tobacco: Not on file  . Alcohol Use: Yes     Comment: occasionally  . Drug Use: No  . Sexual Activity: Not on file   Other Topics Concern  . Not on file   Social History Narrative  . No narrative on file    Past Surgical History  Procedure Laterality Date  . Appendectomy    . Abdominal surgery      tumor removed from abd. non milignant     . aspirin EC  81 mg Oral Daily  . docusate sodium  100 mg Oral BID  . enoxaparin (LOVENOX) injection  70 mg Subcutaneous Q24H  . guaiFENesin  600 mg Oral BID  . hydrochlorothiazide  25 mg Oral Daily  . insulin aspart  0-9 Units Subcutaneous TID WC  . lisinopril  20 mg Oral Daily  . oseltamivir  75 mg Oral BID  . simvastatin  20 mg Oral q1800  . sodium chloride  3 mL Intravenous Q12H   . sodium chloride 100 mL/hr at 07/24/13 0022    Physical Exam: Blood pressure 174/120, pulse 93, temperature 98.9 F (37.2 C), temperature source Oral, resp. rate 17, height 6' (1.829 m), weight 314 lb 4.8 oz (142.566 kg), SpO2 93.00%.   Affect appropriate Obese black  male  HEENT: normal Neck supple with no adenopathy JVP normal no bruits no thyromegaly Lungs clear with no wheezing and good diaphragmatic motion Heart:  S1/S2 no murmur, no rub, gallop or click PMI normal Abdomen: benighn, BS positve, no tenderness, no AAA no bruit.  No HSM or HJR Distal pulses intact with no bruits No edema Neuro non-focal Skin warm and dry tatoos  No muscular weakness   Labs:   Lab Results  Component Value Date   WBC 9.8 07/23/2013   HGB 13.6 07/23/2013   HCT 41.1 07/23/2013   MCV 92.6 07/23/2013   PLT 239 07/23/2013    Recent Labs Lab 07/23/13 0512  NA 139  K 4.0  CL 101  CO2 24  BUN 18  CREATININE 1.30  CALCIUM 8.1*  PROT 7.1  BILITOT 1.0  ALKPHOS 65  ALT 39  AST 25  GLUCOSE 117*   Lab Results  Component Value Date   TROPONINI <0.30 07/23/2013     Radiology: Dg Chest 2  View  07/22/2013   CLINICAL DATA:  Shortness of breath.  EXAM: CHEST  2 VIEW  COMPARISON:  Chest radiograph performed 10/15/2003  FINDINGS: The lungs are well-aerated and clear. There is no evidence of focal opacification, pleural effusion or pneumothorax.  The heart is normal in size; the mediastinal contour is within normal limits. No acute osseous abnormalities are seen.  IMPRESSION: No acute cardiopulmonary process seen.   Electronically Signed   By: Roanna Raider M.D.   On: 07/22/2013 23:13   Ct Angio Chest Pe W/cm &/or Wo Cm  07/23/2013   CLINICAL DATA:  Shortness of breath, hypoxia, low-grade fever, productive cough  EXAM: CT ANGIOGRAPHY CHEST WITH CONTRAST  TECHNIQUE: Multidetector CT imaging of the chest was performed using the standard protocol during bolus administration of intravenous contrast. Multiplanar CT image reconstructions including MIPs were obtained to evaluate the vascular anatomy.  CONTRAST:  OMNIPAQUE IOHEXOL 350 MG/ML SOLN  COMPARISON:  Chest radiographs dated 07/22/2013  FINDINGS: Markedly suboptimal evaluation of the pulmonary arteries secondary to poor bolus timing. At the time of imaging, the vast majority of contrast was in the aorta. Study was subsequently repeated without appreciable improvement.  Although there is no central/saddle embolus, pulmonary embolism cannot be excluded starting with the lobar pulmonary arteries.  The lungs are clear. No suspicious pulmonary nodules. No focal consolidation. No interstitial edema. No pleural effusion or pneumothorax.  Visualized thyroid is unremarkable.  The heart is normal in size.  No pericardial effusion.  No suspicious mediastinal, hilar, or axillary lymphadenopathy.  Visualized upper abdomen is notable for mild hepatic steatosis.  Visualized osseous structures are within normal limits.  Review of the MIP images confirms the above findings.  IMPRESSION: Markedly suboptimal evaluation of the pulmonary arteries secondary to poor  bolus timing.  No evidence of central/saddle pulmonary embolus. Remainder of the pulmonary arteries cannot be adequately evaluated.  No evidence of pneumonia or interstitial edema.  These results were called by telephone at the time of interpretation on 07/23/2013 at 12:54 AM to Dr. Cy Blamer, who verbally acknowledged these results.   Electronically Signed   By: Charline Bills M.D.   On: 07/23/2013 00:59   Nm Pulmonary Perf And Vent  07/23/2013   CLINICAL DATA:  Shortness of breath.  Hypoxia.  EXAM: NUCLEAR MEDICINE VENTILATION - PERFUSION LUNG SCAN  TECHNIQUE: Ventilation images were obtained in multiple projections using inhaled aerosol technetium 99 M DTPA. Perfusion images were obtained in multiple projections after  intravenous injection of Tc-360m MAA.  COMPARISON:  Chest radiograph on 07/22/2013  RADIOPHARMACEUTICALS:  40 mCi Tc-260m DTPA aerosol and 6 mCi Tc-2560m MAA  FINDINGS: Ventilation: No focal ventilation defect.  Perfusion: No wedge shaped peripheral perfusion defects to suggest acute pulmonary embolism.  IMPRESSION: Normal study.  No signs of pulmonary embolism.   Electronically Signed   By: Myles RosenthalJohn  Stahl M.D.   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  Echo EF 30-35%  Continue diuretic and ACE  Right and left cath in am with Dr Jim LikeVarnasi 10:30  Orders written Discussed with patient and brother Willing to proceed.   Rhythm:  May need EP consult  May have ectopic tachycardia and AV nodal disease  Hold beta blockers for now Transfer to stepdown  Pauses are asymptomatic  Will order cardiac MRI to r/o sarcoid Or infiltrative disease and quantify EF  HTN:  Discussed with patient at length need to be compliant with meds  Has good primary [facility] with [facility]   Signed: Charlton Hawseter Gladys Gutman 07/25/2013, 9:21 AM

## 2013-07-25 NOTE — Progress Notes (Signed)
Utilization review completed.  

## 2013-07-25 NOTE — Progress Notes (Signed)
Report given to [facility] RN on 2H.  Corine Shelter PA paged to her line to clarify Cardiac MRI, no telemetry capability at this time.  Patient transferred to 2H17.  Colman Cater

## 2013-07-25 NOTE — Progress Notes (Signed)
[facility] PROGRESS NOTE   Brief summary 38 year old male patient with history of hypertension, noncompliant with medications, admitted on 07/23/13 with complaints of chronic intermittent dyspnea which got worse on day of admission. She denied history of chest pain and had some cough. In the ED found to be hypoxic on room air, acute neck and tachycardic in the 120s. Chest x-ray unremarkable. Given height probability for PE, CTA chest was done but poor study-negative for proximal PE, VQ scan negative.  Assessment/Plan:   Shortness of breath/Hypoxia - Etiology unclear. D-dimer negative. Influenza panel PCR negative. CTA chest: Suboptimal study-no evidence of central/central pulmonary embolus and no evidence of pneumonia or interstitial edema. Chest x-ray: Negative. VQ scan normal. - 2-D echo: LVEF 30-35%, moderate diffuse LV hypokinesis and grade 1 diastolic dysfunction. - ? Dyspnea secondary to cardiomyopathy,?? Element of acute systolic CHF (BNP on admission 576) - Hypoxia seems to have resolved.  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 - Cardiology consulted and recommend transfer to step down unit for close monitoring given episodes of bradycardia/heart block and will consider cardiac cath 1/20. - Resumed home lisinopril.  Bradycardia/possible high degree AV block - Not on rate controlled medications. Check TSH. - Cardiology consulted and will transfer to step down unit for close monitoring.   Tachycardia/Bradycardia - see above  Uncontrolled Hypertension - Resume home lisinopril and HCTZ. Medications will need further titration.  Type II or unspecified type diabetes mellitus without mention of complication, not stated as uncontrolled - stable. - resume metformin as an outpatient.   Code Status: full Family Communication: family/friend at bedside Disposition Plan: transfer to  SDU   Consultants:  Cardiology  Procedures:  V/Q scan  Antibiotics:  Tamiflu -DC'ed  HPI/Subjective: Denies dyspnea or chest pain.  Objective: Filed Vitals:   07/24/13 2330 07/25/13 0000 07/25/13 0400 07/25/13 0757  BP: 158/109 139/90 149/89 174/120  Pulse:  92 88 93  Temp:  97.9 F (36.6 C) 98.3 F (36.8 C) 98.9 F (37.2 C)  TempSrc:  Oral Oral Oral  Resp:  17 17 17   Height:      Weight:      SpO2:  97% 96% 93%    Intake/Output Summary (Last 24 hours) at 07/25/13 0921 Last data filed at 07/24/13 1700  Gross per 24 hour  Intake    600 ml  Output      0 ml  Net    600 ml   Filed Weights   07/22/13 2151 07/23/13 0404  Weight: 136.986 kg (302 lb) 142.566 kg (314 lb 4.8 oz)    Exam:  General: Alert, awake, oriented x3, in no acute distress.  HEENT: No bruits, no goiter.  Heart: Regular rate and rhythm, without murmurs, rubs, gallops. Telemetry: Sinus rhythm but has periods of bradycardia in the 30s-40's with nonconducted P-wave/? High degree AV block Lungs: Good air movement, clear to auscultation. Abdomen: Soft, nontender, nondistended, positive bowel sounds.     Data Reviewed: Basic Metabolic Panel:  Recent Labs Lab 07/22/13 2210 07/23/13 0512  NA 140 139  K 4.0 4.0  CL 100 101  CO2 27 24  GLUCOSE 146* 117*  BUN 18 18  CREATININE 1.40* 1.30  CALCIUM 8.8 8.1*  MG  --  1.8  PHOS  --  3.2   Liver Function Tests:  Recent Labs Lab 07/22/13 2210 07/23/13 0512  AST 30 25  ALT 43 39  ALKPHOS 65 65  BILITOT 1.2 1.0  PROT 7.6 7.1  ALBUMIN 3.9 3.5   No results found for this basename: LIPASE, AMYLASE,  in the last 168 hours No results found for this basename: AMMONIA,  in the last 168 hours CBC:  Recent Labs Lab 07/22/13 2210 07/23/13 0512  WBC 12.1* 9.8  HGB 13.6 13.6  HCT 41.3 41.1  MCV 91.0 92.6  PLT 238 239   Cardiac Enzymes:  Recent Labs Lab 07/22/13 2210 07/23/13 0442 07/23/13 1110 07/23/13 1605  TROPONINI <0.30  <0.30 <0.30 <0.30   BNP (last 3 results)  Recent Labs  07/22/13 2210  PROBNP 575.6*   CBG:  Recent Labs Lab 07/24/13 0816 07/24/13 1145 07/24/13 1623 07/24/13 2055 07/25/13 0725  GLUCAP 98 87 76 93 100*    Recent Results (from the past 240 hour(s))  RESPIRATORY VIRUS PANEL     Status: None   Collection Time    07/23/13  5:10 AM      Result Value Range Status   Source - RVPAN NASOPHARYNGEAL   Final   Respiratory Syncytial Virus A NOT DETECTED   Final   Respiratory Syncytial Virus B NOT DETECTED   Final   Influenza A NOT DETECTED   Final   Influenza B NOT DETECTED   Final   Parainfluenza 1 NOT DETECTED   Final   Parainfluenza 2 NOT DETECTED   Final   Parainfluenza 3 NOT DETECTED   Final   Metapneumovirus NOT DETECTED   Final   Rhinovirus NOT DETECTED   Final   Adenovirus NOT DETECTED   Final   Influenza A H1 NOT DETECTED   Final   Influenza A H3 NOT DETECTED   Final   Comment: (NOTE)           Normal Reference Range for each Analyte: NOT DETECTED     Testing performed using the Luminex xTAG Respiratory Viral Panel test     kit.     This test was developed and its performance characteristics determined     by [facility]. It has not been cleared or approved by the [place]     Food and Drug Administration. This test is used for clinical purposes.     It should not be regarded as investigational or for research. This     laboratory is certified under the [place] (CLIA) as qualified to perform high complexity     clinical laboratory testing.     Performed at [facility]     Status: None   Collection Time    07/23/13  5:12 AM      Result Value Range Status   Specimen Description URINE, CLEAN CATCH   Final   Special Requests ADD   Final   Culture  Setup Time     Final   Value: 07/23/2013 16:02     Performed at [facility]     Final   Value: 10,000 COLONIES/ML      Performed at [facility]   Culture     Final   Value: ESCHERICHIA COLI     Performed at [facility]   Report Status 07/25/2013 FINAL   Final   Organism ID, Bacteria ESCHERICHIA COLI   Final     Studies: Nm Pulmonary Perf And Vent  07/23/2013   CLINICAL DATA:  Shortness of breath.  Hypoxia.  EXAM: NUCLEAR MEDICINE VENTILATION - PERFUSION LUNG SCAN  TECHNIQUE: Ventilation images were  obtained in multiple projections using inhaled aerosol technetium 99 M DTPA. Perfusion images were obtained in multiple projections after intravenous injection of Tc-70mMAA.  COMPARISON:  Chest radiograph on 07/22/2013  RADIOPHARMACEUTICALS:  40 mCi Tc-962mTPA aerosol and 6 mCi Tc-9969mA  FINDINGS: Ventilation: No focal ventilation defect.  Perfusion: No wedge shaped peripheral perfusion defects to suggest acute pulmonary embolism.  IMPRESSION: Normal study.  No signs of pulmonary embolism.   Electronically Signed   By: JohEarle GellD.   On: 07/23/2013 10:17    Scheduled Meds: . aspirin EC  81 mg Oral Daily  . docusate sodium  100 mg Oral BID  . enoxaparin (LOVENOX) injection  70 mg Subcutaneous Q24H  . guaiFENesin  600 mg Oral BID  . hydrochlorothiazide  25 mg Oral Daily  . insulin aspart  0-9 Units Subcutaneous TID WC  . lisinopril  20 mg Oral Daily  . oseltamivir  75 mg Oral BID  . simvastatin  20 mg Oral q1800  . sodium chloride  3 mL Intravenous Q12H   Continuous Infusions: . sodium chloride 100 mL/hr at 07/24/13 0022   Time spent: 50 minutes.  [facility] Pager [phone] 8PM-8AM, please contact night-coverage at [email or web address], password [email or web address], 9:21 AM  LOS: 3 days

## 2013-07-26 ENCOUNTER — Inpatient Hospital Stay (HOSPITAL_COMMUNITY): Payer: Self-pay

## 2013-07-26 ENCOUNTER — Encounter (HOSPITAL_COMMUNITY)
Admission: EM | Disposition: A | Discharge status: Home / Self Care | Payer: Self-pay | Source: Home / Self Care | Attending: Internal Medicine

## 2013-07-26 DIAGNOSIS — R079 Chest pain, unspecified: Secondary | ICD-10-CM

## 2013-07-26 DIAGNOSIS — I495 Sick sinus syndrome: Secondary | ICD-10-CM | POA: Diagnosis not present

## 2013-07-26 DIAGNOSIS — E876 Hypokalemia: Secondary | ICD-10-CM

## 2013-07-26 HISTORY — DX: Sick sinus syndrome: I49.5

## 2013-07-26 HISTORY — DX: Hypokalemia: E87.6

## 2013-07-26 HISTORY — PX: LEFT AND RIGHT HEART CATHETERIZATION WITH CORONARY ANGIOGRAM: SHX5449

## 2013-07-26 LAB — CREATININE, SERUM
Creatinine, Ser: 1.12 mg/dL (ref 0.50–1.35)
GFR calc Af Amer: >90 mL/min (ref >90)
GFR calc non Af Amer: 82 mL/min — ABNORMAL LOW (ref >90)

## 2013-07-26 LAB — BASIC METABOLIC PANEL
BUN: 13 mg/dL (ref 6–23)
CHLORIDE: 97 meq/L (ref 96–112)
CO2: 28 mEq/L (ref 19–32)
CREATININE: 1.18 mg/dL (ref 0.50–1.35)
Calcium: 8.5 mg/dL (ref 8.4–10.5)
GFR calc non Af Amer: 77 mL/min — ABNORMAL LOW (ref >90)
GFR, EST AFRICAN AMERICAN: 89 mL/min — AB (ref >90)
GLUCOSE: 128 mg/dL — AB (ref 70–99)
Potassium: 3.2 mEq/L — ABNORMAL LOW (ref 3.7–5.3)
Sodium: 137 mEq/L (ref 137–147)

## 2013-07-26 LAB — POCT I-STAT 3, ART BLOOD GAS (G3+)
ACID-BASE EXCESS: 3 mmol/L — AB (ref 0.0–2.0)
BICARBONATE: 27.9 meq/L — AB (ref 20.0–24.0)
O2 Saturation: 92 %
TCO2: 29 mmol/L (ref 0–100)
pCO2 arterial: 44.3 mmHg (ref 35.0–45.0)
pH, Arterial: 7.407 (ref 7.350–7.450)
pO2, Arterial: 64 mmHg — ABNORMAL LOW (ref 80.0–100.0)

## 2013-07-26 LAB — CBC
HCT: 39.2 % (ref 39.0–52.0)
Hemoglobin: 13.3 g/dL (ref 13.0–17.0)
MCH: 30.4 pg (ref 26.0–34.0)
MCHC: 33.9 g/dL (ref 30.0–36.0)
MCV: 89.7 fL (ref 78.0–100.0)
PLATELETS: 263 10*3/uL (ref 150–400)
RBC: 4.37 MIL/uL (ref 4.22–5.81)
RDW: 13.1 % (ref 11.5–15.5)
WBC: 9.8 10*3/uL (ref 4.0–10.5)

## 2013-07-26 LAB — POCT I-STAT 3, VENOUS BLOOD GAS (G3P V)
Acid-Base Excess: 3 mmol/L — ABNORMAL HIGH (ref 0.0–2.0)
Bicarbonate: 28.9 mEq/L — ABNORMAL HIGH (ref 20.0–24.0)
O2 SAT: 62 %
PH VEN: 7.403 — AB (ref 7.250–7.300)
TCO2: 30 mmol/L (ref 0–100)
pCO2, Ven: 46.3 mmHg (ref 45.0–50.0)
pO2, Ven: 33 mmHg (ref 30.0–45.0)

## 2013-07-26 LAB — PROTIME-INR
INR: 0.99 (ref 0.00–1.49)
Prothrombin Time: 12.9 seconds (ref 11.6–15.2)

## 2013-07-26 LAB — POCT ACTIVATED CLOTTING TIME: Activated Clotting Time: 188 seconds

## 2013-07-26 LAB — T4, FREE: FREE T4: 1.32 ng/dL (ref 0.80–1.80)

## 2013-07-26 LAB — MAGNESIUM: Magnesium: 2.1 mg/dL (ref 1.5–2.5)

## 2013-07-26 LAB — T3, FREE: T3 FREE: 2.9 pg/mL (ref 2.3–4.2)

## 2013-07-26 SURGERY — LEFT AND RIGHT HEART CATHETERIZATION WITH CORONARY ANGIOGRAM
Anesthesia: LOCAL

## 2013-07-26 MED ORDER — LISINOPRIL 40 MG PO TABS
40.0000 mg | ORAL_TABLET | Freq: Every day | ORAL | Status: DC
  Administered 2013-07-26: 40 mg via ORAL
  Filled 2013-07-26 (×2): qty 1

## 2013-07-26 MED ORDER — ACETAMINOPHEN 325 MG PO TABS
650.0000 mg | ORAL_TABLET | ORAL | Status: DC | PRN
Start: 2013-07-26 — End: 2013-07-28

## 2013-07-26 MED ORDER — LISINOPRIL 20 MG PO TABS
30.0000 mg | ORAL_TABLET | Freq: Every day | ORAL | Status: DC
  Filled 2013-07-26: qty 1

## 2013-07-26 MED ORDER — NITROGLYCERIN 0.2 MG/ML ON CALL CATH LAB
INTRAVENOUS | Status: AC
  Filled 2013-07-26: qty 1

## 2013-07-26 MED ORDER — POTASSIUM CHLORIDE CRYS ER 20 MEQ PO TBCR
40.0000 meq | EXTENDED_RELEASE_TABLET | Freq: Once | ORAL | Status: AC
  Filled 2013-07-26: qty 2

## 2013-07-26 MED ORDER — FENTANYL CITRATE 0.05 MG/ML IJ SOLN
INTRAMUSCULAR | Status: AC
  Filled 2013-07-26: qty 2

## 2013-07-26 MED ORDER — SODIUM CHLORIDE 0.9 % IV SOLN
INTRAVENOUS | Status: DC
  Administered 2013-07-26: 13:00:00 via INTRAVENOUS

## 2013-07-26 MED ORDER — VERAPAMIL HCL 2.5 MG/ML IV SOLN
INTRAVENOUS | Status: AC
  Filled 2013-07-26: qty 2

## 2013-07-26 MED ORDER — POTASSIUM CHLORIDE CRYS ER 20 MEQ PO TBCR
40.0000 meq | EXTENDED_RELEASE_TABLET | Freq: Once | ORAL | Status: AC
  Administered 2013-07-26: 40 meq via ORAL

## 2013-07-26 MED ORDER — CARVEDILOL 6.25 MG PO TABS
6.2500 mg | ORAL_TABLET | Freq: Two times a day (BID) | ORAL | Status: DC
  Administered 2013-07-26 (×2): 6.25 mg via ORAL
  Filled 2013-07-26 (×5): qty 1

## 2013-07-26 MED ORDER — LIDOCAINE HCL (PF) 1 % IJ SOLN
INTRAMUSCULAR | Status: AC
  Filled 2013-07-26: qty 30

## 2013-07-26 MED ORDER — HEPARIN (PORCINE) IN NACL 2-0.9 UNIT/ML-% IJ SOLN
INTRAMUSCULAR | Status: AC
  Filled 2013-07-26: qty 1000

## 2013-07-26 MED ORDER — LABETALOL HCL 5 MG/ML IV SOLN
INTRAVENOUS | Status: AC
  Filled 2013-07-26: qty 4

## 2013-07-26 MED ORDER — MIDAZOLAM HCL 2 MG/2ML IJ SOLN
INTRAMUSCULAR | Status: AC
  Filled 2013-07-26: qty 2

## 2013-07-26 MED ORDER — ONDANSETRON HCL 4 MG/2ML IJ SOLN: 4.0000 mg | Freq: Four times a day (QID) | INTRAMUSCULAR | Status: DC | PRN

## 2013-07-26 MED ORDER — SODIUM CHLORIDE 0.9 % IV SOLN: INTRAVENOUS | Status: DC

## 2013-07-26 MED ORDER — SODIUM CHLORIDE 0.9 % IJ SOLN: 3.0000 mL | INTRAMUSCULAR | Status: DC | PRN

## 2013-07-26 MED ORDER — SODIUM CHLORIDE 0.9 % IJ SOLN
3.0000 mL | Freq: Two times a day (BID) | INTRAMUSCULAR | Status: DC
  Administered 2013-07-27 (×2): 3 mL via INTRAVENOUS

## 2013-07-26 MED ORDER — CARVEDILOL 6.25 MG PO TABS: 6.2500 mg | ORAL_TABLET | Freq: Two times a day (BID) | ORAL | Status: DC

## 2013-07-26 MED ORDER — CARVEDILOL 6.25 MG PO TABS
6.2500 mg | ORAL_TABLET | Freq: Two times a day (BID) | ORAL | Status: DC
Start: 2013-07-26 — End: 2013-07-26

## 2013-07-26 MED ORDER — SODIUM CHLORIDE 0.9 % IV SOLN: 250.0000 mL | INTRAVENOUS | Status: DC | PRN

## 2013-07-26 MED ORDER — HEPARIN SODIUM (PORCINE) 5000 UNIT/ML IJ SOLN
5000.0000 [IU] | Freq: Three times a day (TID) | INTRAMUSCULAR | Status: DC
  Administered 2013-07-26: 5000 [IU] via SUBCUTANEOUS
  Filled 2013-07-26 (×6): qty 1

## 2013-07-26 MED ORDER — ISOSORB DINITRATE-HYDRALAZINE 20-37.5 MG PO TABS
1.0000 | ORAL_TABLET | Freq: Three times a day (TID) | ORAL | Status: DC
  Administered 2013-07-26 (×3): 1 via ORAL
  Filled 2013-07-26 (×6): qty 1

## 2013-07-26 NOTE — Care Management Note (Addendum)
    Page 1 of 2   07/28/2013     12:39:39 PM   CARE MANAGEMENT NOTE 07/28/2013  Patient:  AUDRIC, WEIDNER   Account Number:  [id number]  Date Initiated:  07/26/2013  Documentation initiated by:  Junius Creamer  Subjective/Objective Assessment:   adm w tachycardia     Action/Plan:   lives w fam   Anticipated DC Date:     Anticipated DC Plan:        DC Planning Services  CM consult      Choice offered to / List presented to:  C-1 Patient   DME arranged  CPAP      DME agency  [facility]        Status of service:  Completed, signed off Medicare Important Message given?   (If response is "NO", the following Medicare IM given date fields will be blank) Date Medicare IM given:   Date Additional Medicare IM given:    Discharge Disposition:  HOME/SELF CARE  Per UR Regulation:  Reviewed for med. necessity/level of care/duration of stay  If discussed at Long Length of Stay Meetings, dates discussed:   07/28/2013    Comments:   07-28-13 [address] RN,BSN [phone] CM did fax information to sleep study center and cpap will be delivered to pt's home. No further needs from CM.   1/20 0944 debbie dowell rn,bsn left pt primary care resource list in room. pt off unit. spoke w pt when back from porcedure. pt states he prefers to get his own phy . he plans to find one and watch his bp closely.

## 2013-07-26 NOTE — Progress Notes (Signed)
[facility] PROGRESS NOTE   Brief summary 38 year old male patient with history of hypertension, noncompliant with medications, admitted on 07/23/13 with complaints of chronic intermittent dyspnea which got worse on day of admission. She denied history of chest pain and had some cough. In the ED found to be hypoxic on room air, acute neck and tachycardic in the 120s. Chest x-ray unremarkable. Given height probability for PE, CTA chest was done but poor study-negative for proximal PE, VQ scan negative.  Assessment/Plan:   Shortness of breath/Hypoxia - Etiology unclear. D-dimer negative. Influenza panel PCR negative. CTA chest: Suboptimal study-no evidence of central/central pulmonary embolus and no evidence of pneumonia or interstitial edema. Chest x-ray: Negative. VQ scan normal. - 2-D echo: LVEF 30-35%, moderate diffuse LV hypokinesis and grade 1 diastolic dysfunction. - ? Dyspnea secondary to cardiomyopathy,?? Element of acute systolic CHF (BNP on admission 576) - Hypoxia seems to have resolved. - Night time hypoxia- possibly OSA  New cardiomyopathy/LVEF 30-35% - Likely secondary to uncontrolled hypertensive and hypertensive heart disease. R/O CAD - Cardiology consulted and recommended transfer to step down unit for close monitoring given episodes of bradycardia/heart block and is for cardiac cath 1/20. - Resumed home lisinopril - titrate.  Tachy Brady/possible high degree AV block - Not on rate controll medications. TSH- mildly elevated- check FT4 & FT3. - Cardiology consulted and will transfer to step down unit for close monitoring. - NSSVT in 140's at 825 PM on 1/19 & multiple bradycardic episodes in 30's with non conducted p waves and longest pause 3.84 sec at 120 AM on 1/20 (all asymptomatic). Card/EPS to see- ? PPM. (Discussed with Dr. Johnsie Cancel)  Hypokalemia - replace and follow. Check Mg  Uncontrolled Hypertension - Resume home lisinopril and HCTZ. Medications will need  further titration.  Type II or unspecified type diabetes mellitus without mention of complication, not stated as uncontrolled - stable. - resume metformin as an outpatient.  Possible OSA - Has not had sleep study. Per nursing drops sats at times to 78 overnight with sleep. - Will DW Pulmonary re empiric CPAP pending OP sleep study   Code Status: full Family Communication: none at bedside Disposition Plan: Remains in SDU. Home when medically stable   Consultants:  Cardiology  Procedures:  V/Q scan  Antibiotics:  Tamiflu -DC'ed  HPI/Subjective: Denies dyspnea or chest pain. Anxious to go home  Objective: Filed Vitals:   07/25/13 2315 07/26/13 0123 07/26/13 0357 07/26/13 0603  BP: 170/118 153/99 162/101   Pulse:   98   Temp: 98.5 F (36.9 C)  98.5 F (36.9 C)   TempSrc: Oral  Oral   Resp:      Height:      Weight:    137.893 kg (304 lb)  SpO2:   98%     Intake/Output Summary (Last 24 hours) at 07/26/13 0750 Last data filed at 07/26/13 0600  Gross per 24 hour  Intake    120 ml  Output   1570 ml  Net  -1450 ml   Filed Weights   07/22/13 2151 07/23/13 0404 07/26/13 0603  Weight: 136.986 kg (302 lb) 142.566 kg (314 lb 4.8 oz) 137.893 kg (304 lb)    Exam:  General: Alert, awake, oriented x3, in no acute distress.  HEENT: No bruits, no goiter.  Heart: Regular rate and rhythm, without murmurs, rubs, gallops. Telemetry: Sinus rhythm but has periods of bradycardia in the 30s with nonconducted P-wave/? High degree AV block & an episode of NSSVT (see above) Lungs:  Good air movement, clear to auscultation. Abdomen: Soft, nontender, nondistended, positive bowel sounds.     Data Reviewed: Basic Metabolic Panel:  Recent Labs Lab 07/22/13 2210 07/23/13 0512 07/26/13 0300  NA 140 139 137  K 4.0 4.0 3.2*  CL 100 101 97  CO2 '27 24 28  ' GLUCOSE 146* 117* 128*  BUN '18 18 13  ' CREATININE 1.40* 1.30 1.18  CALCIUM 8.8 8.1* 8.5  MG  --  1.8  --   PHOS  --  3.2   --    Liver Function Tests:  Recent Labs Lab 07/22/13 2210 07/23/13 0512  AST 30 25  ALT 43 39  ALKPHOS 65 65  BILITOT 1.2 1.0  PROT 7.6 7.1  ALBUMIN 3.9 3.5   No results found for this basename: LIPASE, AMYLASE,  in the last 168 hours No results found for this basename: AMMONIA,  in the last 168 hours CBC:  Recent Labs Lab 07/22/13 2210 07/23/13 0512  WBC 12.1* 9.8  HGB 13.6 13.6  HCT 41.3 41.1  MCV 91.0 92.6  PLT 238 239   Cardiac Enzymes:  Recent Labs Lab 07/22/13 2210 07/23/13 0442 07/23/13 1110 07/23/13 1605  TROPONINI <0.30 <0.30 <0.30 <0.30   BNP (last 3 results)  Recent Labs  07/22/13 2210  PROBNP 575.6*   CBG:  Recent Labs Lab 07/24/13 2055 07/25/13 0725 07/25/13 1138 07/25/13 1741 07/25/13 2039  GLUCAP 93 100* 88 100* 88    Recent Results (from the past 240 hour(s))  RESPIRATORY VIRUS PANEL     Status: None   Collection Time    07/23/13  5:10 AM      Result Value Range Status   Source - RVPAN NASOPHARYNGEAL   Final   Respiratory Syncytial Virus A NOT DETECTED   Final   Respiratory Syncytial Virus B NOT DETECTED   Final   Influenza A NOT DETECTED   Final   Influenza B NOT DETECTED   Final   Parainfluenza 1 NOT DETECTED   Final   Parainfluenza 2 NOT DETECTED   Final   Parainfluenza 3 NOT DETECTED   Final   Metapneumovirus NOT DETECTED   Final   Rhinovirus NOT DETECTED   Final   Adenovirus NOT DETECTED   Final   Influenza A H1 NOT DETECTED   Final   Influenza A H3 NOT DETECTED   Final   Comment: (NOTE)           Normal Reference Range for each Analyte: NOT DETECTED     Testing performed using the Luminex xTAG Respiratory Viral Panel test     kit.     This test was developed and its performance characteristics determined     by [facility]. It has not been cleared or approved by the [place]     Food and Drug Administration. This test is used for clinical purposes.     It should not be regarded as investigational or for  research. This     laboratory is certified under the [place] (CLIA) as qualified to perform high complexity     clinical laboratory testing.     Performed at [facility]     Status: None   Collection Time    07/23/13  5:12 AM      Result Value Range Status   Specimen Description URINE, CLEAN CATCH   Final   Special Requests ADD   Final  Culture  Setup Time     Final   Value: 07/23/2013 16:02     Performed at [facility]     Final   Value: 10,000 COLONIES/ML     Performed at [facility]   Culture     Final   Value: ESCHERICHIA COLI     Performed at [facility]   Report Status 07/25/2013 FINAL   Final   Organism ID, Bacteria ESCHERICHIA COLI   Final     Studies: No results found.  Scheduled Meds: . aspirin EC  81 mg Oral Daily  . docusate sodium  100 mg Oral BID  . enoxaparin (LOVENOX) injection  70 mg Subcutaneous Q24H  . guaiFENesin  600 mg Oral BID  . hydrochlorothiazide  25 mg Oral Daily  . lisinopril  20 mg Oral Daily  . potassium chloride  40 mEq Oral Once  . simvastatin  20 mg Oral q1800  . sodium chloride  3 mL Intravenous Q12H   Continuous Infusions:   Time spent: 50 minutes.  [facility] Pager [phone]. If 8PM-8AM, please contact night-coverage at [email or web address], password [facility] 07/26/2013, 7:50 AM  LOS: 4 days

## 2013-07-26 NOTE — Progress Notes (Signed)
Pt scheduled for cardiac MRI today. MRI planned in outpatient MRI. Spoke with MD about not having cardiac monitoring in MRI machine. MD okay with continuing MRI order. Will continue to monitor.

## 2013-07-26 NOTE — CV Procedure (Addendum)
RIGHT AND LEFT HEART CATHETERIZATION REPORT  NAME:  Vernon Reed   MRN: [id number] DOB:  1975/08/13   ADMIT DATE: 07/22/2013 Procedure Date: 07/26/2013  INTERVENTIONAL CARDIOLOGIST: Leonie Man, M.D., MS PRIMARY CARE PROVIDER: No primary provider on file. PRIMARY CARDIOLOGIST: Loralie Champagne, M.D.  PATIENT:  Vernon Reed is a 38 y.o. male   PRE-OPERATIVE DIAGNOSIS:    Cardiomyopathy of unclear etiology   Chest pressure  PROCEDURES PERFORMED:    Right & Left Heart Catheterization via radial and brachial access   PROCEDURE: Consent: The procedure with Risks/Benefits/Alternatives and Indications was reviewed with the patient (and family).  All questions were answered.    Risks / Complications include, but not limited to: Death, MI, CVA/TIA, VF/VT (with defibrillation), Bradycardia (need for temporary pacer placement), contrast induced nephropathy, bleeding / bruising / hematoma / pseudoaneurysm, vascular or coronary injury (with possible emergent CT or Vascular Surgery), adverse medication reactions, infection.    The patient (and family) voiced understanding and agree to proceed.    Consent for signed by MD and patient with RN witness -- placed on chart.  Procedure:  The patient was brought to the 2nd [facility] Lab in the fasting state and prepped and draped in the usual sterile fashion for Right groin or radial / brachial access.  A modified Allen's test with plethysmography was performed on the right wrist demonstrating adequate Ulnar Artery collateral flow.  Sterile technique was used including antiseptics, cap, gloves, gown, hand hygiene, mask and sheet.  Skin prep: Chlorhexidine;  Time Out: Verified patient identification, verified procedure, site/side was marked, verified correct patient position, special equipment/implants available, medications/allergies/relevent history reviewed, required imaging and test results available.   Performed  Access:  Right Radial Artery; 6 Fr Sheath -- Seldinger technique (Angiocath Micropuncture Kit)  Radial Cocktail, IV Heparin  Right Brachial Vein:  Using sterile technique, a 18 gauge IV was inserted in the Right antecubital vein and capped by the cath lab RN.   Right Heart Catheterization The Right Antecubital IV was then re-prepped.  Using an initial set of sterile gloves and towels, an 0.18 mm wire was advanced through the IV allowing for the exchange over wire for an antimicrobial bonded/coated single lumen 5Fr short sheath in the antecubital vein.  Then a 5 Fr Gordy Councilman Catheter was advanced through the sheath, and with the balloon inflated once in the SVC, was advanced under fluoroscopic guidance into the first the Right Atrium, then through the Right Ventricle into the Main Pulmonary Artery and into the Wedge position.  Hemodynamics measurements were obtained in each location. Simultaneous Oxygen saturation were recorded in both the Pulmonary Artery and the Central Aorta.  Simultaneous pressure measurements were obtained in the PA/PCWP and RV along with LV.  Thermodilution injections were not performed to calculate Cardiac Output due to the 5 Fr system.    Then the catheter was removed completely out of the body with the balloon deflated.  The Sheath was flushed with heparinized saline.   The right wrist was anesthetized with 1% subcutaneous Lidocaine.  The right radial artery was accessed using the Seldinger Technique with placement of a 5 Fr Glide Sheath. The sheath was aspirated and flushed.  Then a total of 10 ml of standard Radial Artery Cocktail (see medications) was infused.  Radial Cocktail: 5 mg Verapamil, 400 mcg NTG, 2 ml 2% Lidocaine in 10 ml NS.  5000 Units of IV Heparin was administered once the catheter reached the ascending aorta.  Diagnostic Left Heart Catheterization:  JR 4, ERAD R, JL 3.5,  EBU 3.5, AL-1 TIG 4.0 -catheters advanced and exchanged over longer  safety J-wire into the ascending aorta and across the aortic valve.  Right Coronary Artery Angiography: After unsuccessful engage with JR 4, ERAD R  LV Hemodynamics (LV Gram): JR 4  Left Coronary Artery Angiography: JL 3.5,  EBU 3.5, AL-1 --finally successfully engaged with TIG 4.0  TR Band:  1120 Hours,  24 mL air -- difficult to obtain hemostasis due to the patient's large wrist size and hypertension.  MEDICATIONS:  Anesthesia:  Local Lidocaine  2 ml each for radial and brachial access   Sedation:  2 mg IV Versed,  75 mcg IV fentanyl ;   Omnipaque Contrast:  150 ml  Anticoagulation:  IV Heparin  8000 Units Radial Cocktail: 5 mg Verapamil, 400 mcg NTG, 2 ml 2% Lidocaine in 10 ml NS Labetalol 10 mg IV x2  Hemodynamics:  Findings:  SaO2% Pressures mmHg Mean P mmHg EDP mmHg      Right Atrium   12/10   10      Right Ventricle   36/10   13       Pulmonary Artery  62  34/16   23        PCWP   23/22   20       Central Aortic  92   168/113   151        Left Ventricle   167/17   21         Cardiac Output:  Cardiac Index:       Fick  6.18   2.4    Left Ventriculography:  EF:  Roughly 40 %  Wall Motion:  Global hypokinesis  Coronary Anatomy:  Left Main:  Very large caliber vessel that gives off the LAD, 2 Separate Ramus Intermedius Branches, and the Circumflex. Angiographically normal LAD:  Large-caliber vessel that gives off a very significant proximal septal perforator trunk at the same point is giving off a large first diagonal branch (D1). The vessel then gives a tapering gives rise to a smaller second diagonal branch (D2, small-caliber insignificant) before terminating as a small caliber vessel around the apex just after giving off a moderate caliber third diagonal branch (D3).  D1:  Moderate to large caliber vessel , angiographically normal.  D3:  Moderate large-caliber vessel that tapers rapidly with several bifurcations; this comes off in the distal third of the  LAD.  Left Circumflex:  Large-caliber vessel that tapers to a 90 takeoff from the LM followed by yet again another 90 turn into the AV groove. At the AV groove and bifurcates into the OM 1, then continues distally as the AV groove circumflex giving off OM 2 and LPL 1.  OM1:  Large-caliber vessel that bifurcates distally. Tortuous but angiographically normal.  OM 2:  Moderate large-caliber vessel that is tortuous and bifurcates distally. Angiographically normal.   Ramus intermedius: There actually are 2 branches:  Diagonal branch: Moderate large-caliber vessel, with mild proximal 20% stenosis. Otherwise angiographically normal.  OM branch: Large caliber vessel that runs more like an OM 1 to the inferolateral wall bifurcating distally. Angiographically normal.   RCA:  Very large caliber, dominant vessel that gives rise to several moderate caliber atrial and RV marginal branches. The vessel bifurcates distally into the Right Posterior Descending Artery (RPDA) and the  Right Posterior AV Groove Branch (RPAV). Angiographically normal.  RPDA:  Large-caliber vessel that reaches  to the apex. Tortuous but angiographically normal.  RPL Sysytem:The RPAV  begins as a very large caliber vessel. It gives off a small branch that is the AV nodal artery. It terminates as a large posterior lateral branch.  Angiographically normal.  PATIENT DISPOSITION:    The patient was transferred to the PACU holding area in a hemodynamicaly stable, chest pain free condition.  The patient tolerated the procedure well, and there were no complications.  EBL:   < 10 ml  The patient was stable before, during, and after the procedure.  POST-OPERATIVE DIAGNOSIS:    Angiographically normal, draping vessels consistent with nonischemic cardiomyopathy. Based on the patient's significant hypertension, hypertensive cardiomyopathy is the most likely etiology.  Moderately elevated left ventricular filling pressures to correlate  well with the Pulmonary Capillary Wedge Pressure.  PLAN OF CARE:   Return to CCU for post radial care. Brachial vein sheath will be removed in the PACU holding area.  Patient will need aggressive blood pressure control. I written for carvedilol 625 mg twice a day based on Dr. Marcial Pacas note from this morning.   Leonie Man, M.D., M.S. [facility] [address] [place], [place]  [address]  [phone]  07/26/2013 9:36 AM

## 2013-07-26 NOTE — H&P (View-Only) (Signed)
Patient ID: Vernon Reed, male   DOB: 29-Apr-1976, 38 y.o.   MRN: [id number]   SUBJECTIVE: No complaints this morning.  Feels much better.  No chest pain or dyspnea.  Overnight, he was noted to snore very loudly and to have apneic periods.  He had pauses up to about 3.5 seconds infrequently while sleeping.  He also had runs of relatively slow possible atrial tachycardia and 1 short run of a fast SVT.   Marland Kitchen aspirin EC  81 mg Oral Daily  . carvedilol  6.25 mg Oral BID WC  . docusate sodium  100 mg Oral BID  . guaiFENesin  600 mg Oral BID  . hydrochlorothiazide  25 mg Oral Daily  . isosorbide-hydrALAZINE  1 tablet Oral TID  . lisinopril  40 mg Oral Daily  . potassium chloride  40 mEq Oral Once  . simvastatin  20 mg Oral q1800  . sodium chloride  3 mL Intravenous Q12H     Filed Vitals:   07/26/13 0123 07/26/13 0357 07/26/13 0603 07/26/13 0821  BP: 153/99 162/101  176/109  Pulse:  98    Temp:  98.5 F (36.9 C)  98.6 F (37 C)  TempSrc:  Oral  Oral  Resp:      Height:      Weight:   137.893 kg (304 lb)   SpO2:  98%  98%    Intake/Output Summary (Last 24 hours) at 07/26/13 0839 Last data filed at 07/26/13 0600  Gross per 24 hour  Intake    120 ml  Output   1570 ml  Net  -1450 ml    LABS: Basic Metabolic Panel:  Recent Labs  40/98/11 0300  NA 137  K 3.2*  CL 97  CO2 28  GLUCOSE 128*  BUN 13  CREATININE 1.18  CALCIUM 8.5   Liver Function Tests: No results found for this basename: AST, ALT, ALKPHOS, BILITOT, PROT, ALBUMIN,  in the last 72 hours No results found for this basename: LIPASE, AMYLASE,  in the last 72 hours CBC: No results found for this basename: WBC, NEUTROABS, HGB, HCT, MCV, PLT,  in the last 72 hours Cardiac Enzymes:  Recent Labs  07/23/13 1110 07/23/13 1605  TROPONINI <0.30 <0.30   BNP: No components found with this basename: POCBNP,  D-Dimer: No results found for this basename: DDIMER,  in the last 72 hours Hemoglobin A1C: No results found  for this basename: HGBA1C,  in the last 72 hours Fasting Lipid Panel: No results found for this basename: CHOL, HDL, LDLCALC, TRIG, CHOLHDL, LDLDIRECT,  in the last 72 hours Thyroid Function Tests:  Recent Labs  07/25/13 1000  TSH 4.603*   Anemia Panel: No results found for this basename: VITAMINB12, FOLATE, FERRITIN, TIBC, IRON, RETICCTPCT,  in the last 72 hours  RADIOLOGY: Dg Chest 2 View  07/22/2013   CLINICAL DATA:  Shortness of breath.  EXAM: CHEST  2 VIEW  COMPARISON:  Chest radiograph performed 10/15/2003  FINDINGS: The lungs are well-aerated and clear. There is no evidence of focal opacification, pleural effusion or pneumothorax.  The heart is normal in size; the mediastinal contour is within normal limits. No acute osseous abnormalities are seen.  IMPRESSION: No acute cardiopulmonary process seen.   Electronically Signed   By: Roanna Raider M.D.   On: 07/22/2013 23:13   Ct Angio Chest Pe W/cm &/or Wo Cm  07/23/2013   CLINICAL DATA:  Shortness of breath, hypoxia, low-grade fever, productive cough  EXAM: CT ANGIOGRAPHY CHEST  WITH CONTRAST  TECHNIQUE: Multidetector CT imaging of the chest was performed using the standard protocol during bolus administration of intravenous contrast. Multiplanar CT image reconstructions including MIPs were obtained to evaluate the vascular anatomy.  CONTRAST:  OMNIPAQUE IOHEXOL 350 MG/ML SOLN  COMPARISON:  Chest radiographs dated 07/22/2013  FINDINGS: Markedly suboptimal evaluation of the pulmonary arteries secondary to poor bolus timing. At the time of imaging, the vast majority of contrast was in the aorta. Study was subsequently repeated without appreciable improvement.  Although there is no central/saddle embolus, pulmonary embolism cannot be excluded starting with the lobar pulmonary arteries.  The lungs are clear. No suspicious pulmonary nodules. No focal consolidation. No interstitial edema. No pleural effusion or pneumothorax.  Visualized thyroid  is unremarkable.  The heart is normal in size.  No pericardial effusion.  No suspicious mediastinal, hilar, or axillary lymphadenopathy.  Visualized upper abdomen is notable for mild hepatic steatosis.  Visualized osseous structures are within normal limits.  Review of the MIP images confirms the above findings.  IMPRESSION: Markedly suboptimal evaluation of the pulmonary arteries secondary to poor bolus timing.  No evidence of central/saddle pulmonary embolus. Remainder of the pulmonary arteries cannot be adequately evaluated.  No evidence of pneumonia or interstitial edema.  These results were called by telephone at the time of interpretation on 07/23/2013 at 12:54 AM to Dr. Cy Blamer, who verbally acknowledged these results.   Electronically Signed   By: Charline Bills M.D.   On: 07/23/2013 00:59   Nm Pulmonary Perf And Vent  07/23/2013   CLINICAL DATA:  Shortness of breath.  Hypoxia.  EXAM: NUCLEAR MEDICINE VENTILATION - PERFUSION LUNG SCAN  TECHNIQUE: Ventilation images were obtained in multiple projections using inhaled aerosol technetium 99 M DTPA. Perfusion images were obtained in multiple projections after intravenous injection of Tc-64m MAA.  COMPARISON:  Chest radiograph on 07/22/2013  RADIOPHARMACEUTICALS:  40 mCi Tc-62m DTPA aerosol and 6 mCi Tc-87m MAA  FINDINGS: Ventilation: No focal ventilation defect.  Perfusion: No wedge shaped peripheral perfusion defects to suggest acute pulmonary embolism.  IMPRESSION: Normal study.  No signs of pulmonary embolism.   Electronically Signed   By: Myles Rosenthal M.D.   On: 07/23/2013 10:17    PHYSICAL EXAM General: NAD Neck: Thick, no JVD, no thyromegaly or thyroid nodule.  Lungs: Decreased breath sounds right base.  CV: Nondisplaced PMI.  Heart regular S1/S2, +S4, no murmur.  No peripheral edema.  No carotid bruit.  Normal pedal pulses.  Abdomen: Soft, nontender, no hepatosplenomegaly, no distention.  Neurologic: Alert and oriented x 3.  Psych:  Normal affect. Extremities: No clubbing or cyanosis.   TELEMETRY: Reviewed telemetry pt in NSR.  Infrequent pauses with 3.5 sec being the longest (overnight, presumably while sleeping).  Short runs of relatively slow tachy, probably atrial tachy.  1 run of rapid SVT (about 15-20 beats).    ASSESSMENT AND PLAN: 38 yo with history of untreated HTN presented with hypertensive urgency associated with nausea and chest pain.  Echo showed EF 30-35% with diffuse HK and LVH.  V/Q scan with no PE.  1. Cardiomyopathy: EF 30-35%, diffuse hypokinesis with LVH.  He does not appear markedly volume overloaded on exam. He is not short of breath this morning.  Etiology of cardiomyopathy is uncertain but possibly due to uncontrolled BP over a long period of time.   - Given chest pain and cardiomyopathy as well as FH of early CAD, will plan LHC/RHC this morning to rule out CAD and  assess filling pressures.  This has already been scheduled.  - Cardiac MRI to assess for infiltrative disease, particularly sarcoidosis given arrhythmias.  - Increase lisinopril to 40 mg daily and add Bidil 1 tab tid.  - I think that we can safely add Coreg 6.25 mg bid.  Will follow arrhythmias.  2. HTN: Presentation with hypertensive urgency.  BP not controlled.  As above, increasing lisinopril and adding Bidil + Coreg.  He is also on HCTZ.  3. Rhythm: Patient has probable significant sleep apnea, had a sleep study ordered as outpatient.  He as been noted to have sinus pauses up to 3.5 seconds while asleep and snoring loudly.  He has had runs of relatively slow tachy that is likely atrial tachycardia.  He had 1 run of rapid SVT for about 15 beats.  I suspect that his arrhythmias are related to untreated sleep apnea.  Will arrange sleep study as outpatient.  I think we can start him on Coreg but will follow telemetry closely.   Marca AnconaDalton Arthor Gorter 07/26/2013 8:47 AM

## 2013-07-26 NOTE — Progress Notes (Addendum)
Patient ID: Vernon Reed, male   DOB: 05-24-1976, 38 y.o.   MRN: [id number]   SUBJECTIVE: No complaints this morning.  Feels much better.  No chest pain or dyspnea.  Overnight, he was noted to snore very loudly and to have apneic periods.  He had pauses up to about 3.5 seconds infrequently while sleeping.  He also had runs of relatively slow possible atrial tachycardia and 1 short run of a fast SVT.   Marland Kitchen aspirin EC  81 mg Oral Daily  . carvedilol  6.25 mg Oral BID WC  . docusate sodium  100 mg Oral BID  . guaiFENesin  600 mg Oral BID  . hydrochlorothiazide  25 mg Oral Daily  . isosorbide-hydrALAZINE  1 tablet Oral TID  . lisinopril  40 mg Oral Daily  . potassium chloride  40 mEq Oral Once  . simvastatin  20 mg Oral q1800  . sodium chloride  3 mL Intravenous Q12H     Filed Vitals:   07/26/13 0123 07/26/13 0357 07/26/13 0603 07/26/13 0821  BP: 153/99 162/101  176/109  Pulse:  98    Temp:  98.5 F (36.9 C)  98.6 F (37 C)  TempSrc:  Oral  Oral  Resp:      Height:      Weight:   137.893 kg (304 lb)   SpO2:  98%  98%    Intake/Output Summary (Last 24 hours) at 07/26/13 0839 Last data filed at 07/26/13 0600  Gross per 24 hour  Intake    120 ml  Output   1570 ml  Net  -1450 ml    LABS: Basic Metabolic Panel:  Recent Labs  40/98/11 0300  NA 137  K 3.2*  CL 97  CO2 28  GLUCOSE 128*  BUN 13  CREATININE 1.18  CALCIUM 8.5   Liver Function Tests: No results found for this basename: AST, ALT, ALKPHOS, BILITOT, PROT, ALBUMIN,  in the last 72 hours No results found for this basename: LIPASE, AMYLASE,  in the last 72 hours CBC: No results found for this basename: WBC, NEUTROABS, HGB, HCT, MCV, PLT,  in the last 72 hours Cardiac Enzymes:  Recent Labs  07/23/13 1110 07/23/13 1605  TROPONINI <0.30 <0.30   BNP: No components found with this basename: POCBNP,  D-Dimer: No results found for this basename: DDIMER,  in the last 72 hours Hemoglobin A1C: No results found  for this basename: HGBA1C,  in the last 72 hours Fasting Lipid Panel: No results found for this basename: CHOL, HDL, LDLCALC, TRIG, CHOLHDL, LDLDIRECT,  in the last 72 hours Thyroid Function Tests:  Recent Labs  07/25/13 1000  TSH 4.603*   Anemia Panel: No results found for this basename: VITAMINB12, FOLATE, FERRITIN, TIBC, IRON, RETICCTPCT,  in the last 72 hours  RADIOLOGY: Dg Chest 2 View  07/22/2013   CLINICAL DATA:  Shortness of breath.  EXAM: CHEST  2 VIEW  COMPARISON:  Chest radiograph performed 10/15/2003  FINDINGS: The lungs are well-aerated and clear. There is no evidence of focal opacification, pleural effusion or pneumothorax.  The heart is normal in size; the mediastinal contour is within normal limits. No acute osseous abnormalities are seen.  IMPRESSION: No acute cardiopulmonary process seen.   Electronically Signed   By: Roanna Raider M.D.   On: 07/22/2013 23:13   Ct Angio Chest Pe W/cm &/or Wo Cm  07/23/2013   CLINICAL DATA:  Shortness of breath, hypoxia, low-grade fever, productive cough  EXAM: CT ANGIOGRAPHY CHEST  WITH CONTRAST  TECHNIQUE: Multidetector CT imaging of the chest was performed using the standard protocol during bolus administration of intravenous contrast. Multiplanar CT image reconstructions including MIPs were obtained to evaluate the vascular anatomy.  CONTRAST:  OMNIPAQUE IOHEXOL 350 MG/ML SOLN  COMPARISON:  Chest radiographs dated 07/22/2013  FINDINGS: Markedly suboptimal evaluation of the pulmonary arteries secondary to poor bolus timing. At the time of imaging, the vast majority of contrast was in the aorta. Study was subsequently repeated without appreciable improvement.  Although there is no central/saddle embolus, pulmonary embolism cannot be excluded starting with the lobar pulmonary arteries.  The lungs are clear. No suspicious pulmonary nodules. No focal consolidation. No interstitial edema. No pleural effusion or pneumothorax.  Visualized thyroid  is unremarkable.  The heart is normal in size.  No pericardial effusion.  No suspicious mediastinal, hilar, or axillary lymphadenopathy.  Visualized upper abdomen is notable for mild hepatic steatosis.  Visualized osseous structures are within normal limits.  Review of the MIP images confirms the above findings.  IMPRESSION: Markedly suboptimal evaluation of the pulmonary arteries secondary to poor bolus timing.  No evidence of central/saddle pulmonary embolus. Remainder of the pulmonary arteries cannot be adequately evaluated.  No evidence of pneumonia or interstitial edema.  These results were called by telephone at the time of interpretation on 07/23/2013 at 12:54 AM to Dr. Cy Blamer, who verbally acknowledged these results.   Electronically Signed   By: Charline Bills M.D.   On: 07/23/2013 00:59   Nm Pulmonary Perf And Vent  07/23/2013   CLINICAL DATA:  Shortness of breath.  Hypoxia.  EXAM: NUCLEAR MEDICINE VENTILATION - PERFUSION LUNG SCAN  TECHNIQUE: Ventilation images were obtained in multiple projections using inhaled aerosol technetium 99 M DTPA. Perfusion images were obtained in multiple projections after intravenous injection of Tc-48m MAA.  COMPARISON:  Chest radiograph on 07/22/2013  RADIOPHARMACEUTICALS:  40 mCi Tc-31m DTPA aerosol and 6 mCi Tc-44m MAA  FINDINGS: Ventilation: No focal ventilation defect.  Perfusion: No wedge shaped peripheral perfusion defects to suggest acute pulmonary embolism.  IMPRESSION: Normal study.  No signs of pulmonary embolism.   Electronically Signed   By: Myles Rosenthal M.D.   On: 07/23/2013 10:17    PHYSICAL EXAM General: NAD Neck: Thick, no JVD, no thyromegaly or thyroid nodule.  Lungs: Decreased breath sounds right base.  CV: Nondisplaced PMI.  Heart regular S1/S2, +S4, no murmur.  No peripheral edema.  No carotid bruit.  Normal pedal pulses.  Abdomen: Soft, nontender, no hepatosplenomegaly, no distention.  Neurologic: Alert and oriented x 3.  Psych:  Normal affect. Extremities: No clubbing or cyanosis.   TELEMETRY: Reviewed telemetry pt in NSR.  Infrequent pauses with 3.5 sec being the longest (overnight, presumably while sleeping).  Short runs of relatively slow tachy, probably atrial tachy.  1 run of rapid SVT (about 15-20 beats).    ASSESSMENT AND PLAN: 38 yo with history of untreated HTN presented with hypertensive urgency associated with nausea and chest pain.  Echo showed EF 30-35% with diffuse HK and LVH.  V/Q scan with no PE.  1. Cardiomyopathy: EF 30-35%, diffuse hypokinesis with LVH.  He does not appear markedly volume overloaded on exam. He is not short of breath this morning.  Etiology of cardiomyopathy is uncertain but possibly due to uncontrolled BP over a long period of time.   - Given chest pain and cardiomyopathy as well as FH of early CAD, will plan LHC/RHC this morning to rule out CAD and  assess filling pressures.  This has already been scheduled.  - Cardiac MRI to assess for infiltrative disease, particularly sarcoidosis given arrhythmias.  - Increase lisinopril to 40 mg daily and add Bidil 1 tab tid.  - I think that we can safely add Coreg 6.25 mg bid.  Will follow arrhythmias.  2. HTN: Presentation with hypertensive urgency.  BP not controlled.  As above, increasing lisinopril and adding Bidil + Coreg.  He is also on HCTZ.  3. Rhythm: Patient has probable significant sleep apnea, had a sleep study ordered as outpatient.  He as been noted to have sinus pauses up to 3.5 seconds while asleep and snoring loudly.  He has had runs of relatively slow tachy that is likely atrial tachycardia.  He had 1 run of rapid SVT for about 15 beats.  I suspect that his arrhythmias are related to untreated sleep apnea.  Will arrange sleep study as outpatient.  I think we can start him on Coreg but will follow telemetry closely.   Vernon Reed 07/26/2013 8:47 AM  No CAD on cath.  Patient was unable to fit in MRI so this cannot be  done.  Vernon Reed

## 2013-07-26 NOTE — Progress Notes (Signed)
Pt brought to MRI, laid on the table and we test-fitted to see if he would fit in scanner.  Two employees pushed hard on the table to get him to go in, but table was stuck when gantry reached pt's shoulders which caused pain.  Exam stopped at this point and Dr Marca Ancona was notified.

## 2013-07-26 NOTE — Interval H&P Note (Signed)
History and Physical Interval Note:  07/26/2013 8:55 AM  Vernon Reed  has presented today for surgery, with the diagnosis of cm  The various methods of treatment have been discussed with the patient and family. After consideration of risks, benefits and other options for treatment, the patient has consented to  Procedure(s): LEFT AND RIGHT HEART CATHETERIZATION WITH CORONARY ANGIOGRAM (N/A) as a surgical intervention .  The patient's history has been reviewed, patient examined, no change in status, stable for surgery.  I have reviewed the patient's chart and labs.  Questions were answered to the patient's satisfaction.     HARDING,DAVID W  Cath Lab Visit (complete for each Cath Lab visit)  Clinical Evaluation Leading to the Procedure:   ACS: no  Non-ACS:    Anginal Classification: No Symptoms  Anti-ischemic medical therapy: Maximal Therapy (2 or more classes of medications)  Non-Invasive Test Results: No non-invasive testing performed  Prior CABG: No previous CABG

## 2013-07-27 DIAGNOSIS — N179 Acute kidney failure, unspecified: Secondary | ICD-10-CM

## 2013-07-27 DIAGNOSIS — I5021 Acute systolic (congestive) heart failure: Secondary | ICD-10-CM

## 2013-07-27 HISTORY — DX: Acute kidney failure, unspecified: N17.9

## 2013-07-27 LAB — CBC
HCT: 38 % — ABNORMAL LOW (ref 39.0–52.0)
HEMOGLOBIN: 12.5 g/dL — AB (ref 13.0–17.0)
MCH: 30.3 pg (ref 26.0–34.0)
MCHC: 32.9 g/dL (ref 30.0–36.0)
MCV: 92 fL (ref 78.0–100.0)
PLATELETS: 268 10*3/uL (ref 150–400)
RBC: 4.13 MIL/uL — ABNORMAL LOW (ref 4.22–5.81)
RDW: 13.4 % (ref 11.5–15.5)
WBC: 10.7 10*3/uL — ABNORMAL HIGH (ref 4.0–10.5)

## 2013-07-27 LAB — BASIC METABOLIC PANEL
BUN: 15 mg/dL (ref 6–23)
CALCIUM: 8.5 mg/dL (ref 8.4–10.5)
CO2: 28 meq/L (ref 19–32)
Chloride: 100 mEq/L (ref 96–112)
Creatinine, Ser: 1.53 mg/dL — ABNORMAL HIGH (ref 0.50–1.35)
GFR calc Af Amer: 65 mL/min — ABNORMAL LOW (ref >90)
GFR calc non Af Amer: 56 mL/min — ABNORMAL LOW (ref >90)
GLUCOSE: 103 mg/dL — AB (ref 70–99)
Potassium: 4.5 mEq/L (ref 3.7–5.3)
SODIUM: 138 meq/L (ref 137–147)

## 2013-07-27 LAB — POCT ACTIVATED CLOTTING TIME: Activated Clotting Time: 166 seconds

## 2013-07-27 LAB — GLUCOSE, CAPILLARY: Glucose-Capillary: 79 mg/dL (ref 70–99)

## 2013-07-27 MED ORDER — LISINOPRIL 40 MG PO TABS
40.0000 mg | ORAL_TABLET | Freq: Every day | ORAL | Status: DC
Start: 2013-07-28 — End: 2013-07-28
  Administered 2013-07-28: 40 mg via ORAL
  Filled 2013-07-27: qty 1

## 2013-07-27 MED ORDER — CARVEDILOL 12.5 MG PO TABS
12.5000 mg | ORAL_TABLET | Freq: Two times a day (BID) | ORAL | Status: DC
  Administered 2013-07-27 – 2013-07-28 (×2): 12.5 mg via ORAL
  Filled 2013-07-27 (×4): qty 1

## 2013-07-27 MED ORDER — ENOXAPARIN SODIUM 40 MG/0.4ML ~~LOC~~ SOLN
40.0000 mg | Freq: Two times a day (BID) | SUBCUTANEOUS | Status: DC
  Filled 2013-07-27 (×4): qty 0.4

## 2013-07-27 MED ORDER — ISOSORB DINITRATE-HYDRALAZINE 20-37.5 MG PO TABS
2.0000 | ORAL_TABLET | Freq: Three times a day (TID) | ORAL | Status: DC
  Administered 2013-07-27 – 2013-07-28 (×4): 2 via ORAL
  Filled 2013-07-27 (×6): qty 2

## 2013-07-27 MED ORDER — CARVEDILOL 12.5 MG PO TABS: 12.5000 mg | ORAL_TABLET | Freq: Two times a day (BID) | ORAL | Status: DC

## 2013-07-27 MED ORDER — CARVEDILOL 6.25 MG PO TABS
6.2500 mg | ORAL_TABLET | Freq: Two times a day (BID) | ORAL | Status: DC
Start: 2013-07-27 — End: 2013-07-27
  Filled 2013-07-27 (×2): qty 1

## 2013-07-27 NOTE — Progress Notes (Addendum)
Patient ID: Vernon Reed, male   DOB: 19-Jan-1976, 38 y.o.   MRN: [id number]   SUBJECTIVE:  Cath yesterday without CAD. Feels better denies CP or dyspnea. I/Os essentially even overnight.   Ambulating hall without difficulty. No further events on tele. Very eager to go home today.   Cr 1.1->1.5   Right Atrium   12/10   10   Right Ventricle   36/10   13   Pulmonary Artery  62  34/16  23    PCWP   23/22  20    Central Aortic  92  168/113  151    Left Ventricle   167/17   21          Cardiac Output:   Cardiac Index:    Fick  6.18   2.4    Left Ventriculography:  EF: Roughly 40 %    . aspirin EC  81 mg Oral Daily  . carvedilol  6.25 mg Oral BID WC  . docusate sodium  100 mg Oral BID  . guaiFENesin  600 mg Oral BID  . heparin  5,000 Units Subcutaneous Q8H  . isosorbide-hydrALAZINE  2 tablet Oral TID  . simvastatin  20 mg Oral q1800  . sodium chloride  3 mL Intravenous Q12H     Filed Vitals:   07/27/13 0000 07/27/13 0350 07/27/13 0811 07/27/13 1209  BP: 116/69 114/74 151/97 148/52  Pulse: 90 87 86 89  Temp: 98.8 F (37.1 C)  98.6 F (37 C) 98.4 F (36.9 C)  TempSrc: Oral  Oral Oral  Resp: 16 18 20 22   Height:      Weight:      SpO2: 96% 94% 95% 96%    Intake/Output Summary (Last 24 hours) at 07/27/13 1250 Last data filed at 07/27/13 0900  Gross per 24 hour  Intake 2073.75 ml  Output   1300 ml  Net 773.75 ml    LABS: Basic Metabolic Panel:  Recent Labs  93/81/01 0300 07/26/13 0845 07/26/13 1437 07/27/13 0335  NA 137  --   --  138  K 3.2*  --   --  4.5  CL 97  --   --  100  CO2 28  --   --  28  GLUCOSE 128*  --   --  103*  BUN 13  --   --  15  CREATININE 1.18  --  1.12 1.53*  CALCIUM 8.5  --   --  8.5  MG  --  2.1  --   --    Liver Function Tests: No results found for this basename: AST, ALT, ALKPHOS, BILITOT, PROT, ALBUMIN,  in the last 72 hours No results found for this basename: LIPASE, AMYLASE,  in the last 72 hours CBC:  Recent Labs  07/26/13 1437 07/27/13 0335  WBC 9.8 10.7*  HGB 13.3 12.5*  HCT 39.2 38.0*  MCV 89.7 92.0  PLT 263 268   Cardiac Enzymes: No results found for this basename: CKTOTAL, CKMB, CKMBINDEX, TROPONINI,  in the last 72 hours BNP: No components found with this basename: POCBNP,  D-Dimer: No results found for this basename: DDIMER,  in the last 72 hours Hemoglobin A1C: No results found for this basename: HGBA1C,  in the last 72 hours Fasting Lipid Panel: No results found for this basename: CHOL, HDL, LDLCALC, TRIG, CHOLHDL, LDLDIRECT,  in the last 72 hours Thyroid Function Tests:  Recent Labs  07/25/13 1000 07/26/13 0845  TSH 4.603*  --  T3FREE  --  2.9   Anemia Panel: No results found for this basename: VITAMINB12, FOLATE, FERRITIN, TIBC, IRON, RETICCTPCT,  in the last 72 hours  RADIOLOGY: Dg Chest 2 View  07/22/2013   CLINICAL DATA:  Shortness of breath.  EXAM: CHEST  2 VIEW  COMPARISON:  Chest radiograph performed 10/15/2003  FINDINGS: The lungs are well-aerated and clear. There is no evidence of focal opacification, pleural effusion or pneumothorax.  The heart is normal in size; the mediastinal contour is within normal limits. No acute osseous abnormalities are seen.  IMPRESSION: No acute cardiopulmonary process seen.   Electronically Signed   By: Roanna Raider M.D.   On: 07/22/2013 23:13   Ct Angio Chest Pe W/cm &/or Wo Cm  07/23/2013   CLINICAL DATA:  Shortness of breath, hypoxia, low-grade fever, productive cough  EXAM: CT ANGIOGRAPHY CHEST WITH CONTRAST  TECHNIQUE: Multidetector CT imaging of the chest was performed using the standard protocol during bolus administration of intravenous contrast. Multiplanar CT image reconstructions including MIPs were obtained to evaluate the vascular anatomy.  CONTRAST:  OMNIPAQUE IOHEXOL 350 MG/ML SOLN  COMPARISON:  Chest radiographs dated 07/22/2013  FINDINGS: Markedly suboptimal evaluation of the pulmonary arteries secondary to poor  bolus timing. At the time of imaging, the vast majority of contrast was in the aorta. Study was subsequently repeated without appreciable improvement.  Although there is no central/saddle embolus, pulmonary embolism cannot be excluded starting with the lobar pulmonary arteries.  The lungs are clear. No suspicious pulmonary nodules. No focal consolidation. No interstitial edema. No pleural effusion or pneumothorax.  Visualized thyroid is unremarkable.  The heart is normal in size.  No pericardial effusion.  No suspicious mediastinal, hilar, or axillary lymphadenopathy.  Visualized upper abdomen is notable for mild hepatic steatosis.  Visualized osseous structures are within normal limits.  Review of the MIP images confirms the above findings.  IMPRESSION: Markedly suboptimal evaluation of the pulmonary arteries secondary to poor bolus timing.  No evidence of central/saddle pulmonary embolus. Remainder of the pulmonary arteries cannot be adequately evaluated.  No evidence of pneumonia or interstitial edema.  These results were called by telephone at the time of interpretation on 07/23/2013 at 12:54 AM to Dr. Cy Blamer, who verbally acknowledged these results.   Electronically Signed   By: Charline Bills M.D.   On: 07/23/2013 00:59   Nm Pulmonary Perf And Vent  07/23/2013   CLINICAL DATA:  Shortness of breath.  Hypoxia.  EXAM: NUCLEAR MEDICINE VENTILATION - PERFUSION LUNG SCAN  TECHNIQUE: Ventilation images were obtained in multiple projections using inhaled aerosol technetium 99 M DTPA. Perfusion images were obtained in multiple projections after intravenous injection of Tc-27m MAA.  COMPARISON:  Chest radiograph on 07/22/2013  RADIOPHARMACEUTICALS:  40 mCi Tc-81m DTPA aerosol and 6 mCi Tc-96m MAA  FINDINGS: Ventilation: No focal ventilation defect.  Perfusion: No wedge shaped peripheral perfusion defects to suggest acute pulmonary embolism.  IMPRESSION: Normal study.  No signs of pulmonary embolism.    Electronically Signed   By: Myles Rosenthal M.D.   On: 07/23/2013 10:17    PHYSICAL EXAM General: NAD Neck: Thick, no JVD, no thyromegaly or thyroid nodule.  Lungs: clear.  CV: Nondisplaced PMI.  Heart regular S1/S2, +S4, no murmur.  No peripheral edema.  No carotid bruit.  Normal pedal pulses.  Abdomen: Obese Soft, nontender, no hepatosplenomegaly, no distention.  Neurologic: Alert and oriented x 3.  Psych: Normal affect. Extremities: No clubbing or cyanosis.   TELEMETRY: Reviewed  telemetry pt in NSR.  Infrequent pauses with 3.5 sec being the longest (overnight, presumably while sleeping).  Short runs of relatively slow tachy, probably atrial tachy.  1 run of rapid SVT (about 15-20 beats).    ASSESSMENT AND PLAN: 38 yo with history of untreated HTN presented with hypertensive urgency associated with nausea and chest pain.  Echo showed EF 30-35% with diffuse HK and LVH.  V/Q scan with no PE.   1. Acute systolic HF: EF 30-35%, d/t NICM - likely due to uncontrolled HTN and OSA. - Can consider cardiac MRI to assess for infiltrative disease, particularly sarcoidosis given arrhythmias.  - Continue lisinopril to 40 mg daily and add Bidil 1 tab tid.  - Titrate Coreg 12,5 mg bid.  Will follow arrhythmias.  2. HTN: Presentation with hypertensive urgency.  BP improving. Titrating meds. Spiro and amlodipine also options. .   3. Rhythm: Patient has probable significant sleep apnea, had a sleep study ordered as outpatient.  He as been noted to have sinus pauses up to 3.5 seconds while asleep and snoring loudly.  He has had runs of relatively slow tachy that is likely atrial tachycardia.  He had 1 run of rapid SVT for about 15 beats.  I suspect that his arrhythmias are related to untreated sleep apnea.  Will arrange sleep study as outpatient. \ 4. Acute kidney injury, stage 3. Will follow. Hold diuretic today.  5. Morbid obesity 6. DM2  Will transfer to tele. Probably home tomorrow if renal function  stable. Can f/u in HF Clinic.   Reuel BoomDaniel BensimhonMD 07/27/2013 12:50 PM  No CAD on cath.  Patient was unable to fit in MRI so this cannot be done.  Daniel Bensimhon

## 2013-07-27 NOTE — Progress Notes (Signed)
Patient set up on CPAP, auto titrate with large nasal mask.  Tolerating well, RT to monitor.

## 2013-07-27 NOTE — Progress Notes (Signed)
Patient placed on CPAP at 2337. Patient tolerating well at this time.

## 2013-07-27 NOTE — Progress Notes (Signed)
Pt O2 sat down to 86% on CPAP. O2 @ 3L bleed into CPAP, O2 sat up to 93%. Pt asleep. No distress noted. Will continue to monitor.

## 2013-07-27 NOTE — Progress Notes (Addendum)
[facility] PROGRESS NOTE  Vernon Reed QAS:[id number] DOB: 1975-08-01 DOA: 07/22/2013 PCP: No primary provider on file.   Brief summary  38 year old male patient with history of hypertension, noncompliant with medications, admitted on 07/23/13 with complaints of chronic intermittent dyspnea which got worse on day of admission. She denied history of chest pain and had some cough. In the ED found to be hypoxic on room air, acute neck and tachycardic in the 120s. Chest x-ray unremarkable. Given height probability for PE, CTA chest was done but poor study-negative for proximal PE, VQ scan negative.   Assessment/Plan:  1. SOB multifactorial: OSA+Acute systolic CHF; echo: LVEF 79%; s/p LHC: no CAD - D-dimer negative. Influenza panel PCR negative. CTA chest: Suboptimal study-no evidence of central/central pulmonary embolus and no evidence of pneumonia or interstitial edema. Chest x-ray: Negative. VQ scan normal.  - 2-D echo: LVEF 30-35%, moderate diffuse LV hypokinesis and grade 1 diastolic dysfunction.  - ? Dyspnea secondary to cardiomyopathy,?? Element of acute systolic CHF (BNP on admission 576)  - Hypoxia seems to have resolved.  - Night time hypoxia- possibly OSA   2. New cardiomyopathy/LVEF 30-35%  - Likely secondary to uncontrolled hypertensive and hypertensive heart disease. R/O CAD  - Cardiology consulted and recommended transfer to step down unit for close monitoring given episodes of bradycardia/heart block and is for cardiac cath 1/20.  - hold isinopril with AKI  3. Tachy Brady/possible high degree AV block  - Not on rate controll medications. TSH- mildly elevated- check FT4 & FT3.  - Cardiology consulted and will transfer to step down unit for close monitoring.  - NSSVT in 140's at 825 PM on 1/19 & multiple bradycardic episodes in 30's with non conducted p waves and longest pause 3.84 sec at 120 AM on 1/20 (all asymptomatic). Card/EPS to see- ? PPM. (Discussed with Dr. Johnsie Cancel)    4. Hypokalemia  - replace and follow. Check Mg   5. Uncontrolled Hypertension  - hold  lisinopril and HCTZ with AKI. Increase hydralazine NTG, BB (could not increase yet with HB)  6. Type II or unspecified type diabetes mellitus without mention of complication, not stated as uncontrolled  - stable.  - resume metformin as an outpatient.   7. Possible OSA  - Has not had sleep study. Per nursing drops sats at times to 78 overnight with sleep.  - Will DW Pulmonary re empiric CPAP pending OP sleep study  8. AKI likely contrast+ ACE/diuresis; hold nephrotoxins; recheck renal function in AM   Code Status: full Family Communication: d/w patient  (indicate person spoken with, relationship, and if by phone, the number) Disposition Plan: home when ready    Consultants:  Cardiology   Procedures:  Echo, LHC  Antibiotics:  None  (indicate start date, and stop date if known)  HPI/Subjective: alert  Objective: Filed Vitals:   07/27/13 0811  BP: 151/97  Pulse: 86  Temp: 98.6 F (37 C)  Resp: 20    Intake/Output Summary (Last 24 hours) at 07/27/13 0911 Last data filed at 07/27/13 0600  Gross per 24 hour  Intake 1528.75 ml  Output   1300 ml  Net 228.75 ml   Filed Weights   07/22/13 2151 07/23/13 0404 07/26/13 0603  Weight: 136.986 kg (302 lb) 142.566 kg (314 lb 4.8 oz) 137.893 kg (304 lb)    Exam:   General:  alert  Cardiovascular: s1,s2 rrr  Respiratory: few crackles in LL  Abdomen: soft, obese, nt  Musculoskeletal: no edema   Data  Reviewed: Basic Metabolic Panel:  Recent Labs Lab 07/22/13 2210 07/23/13 0512 07/26/13 0300 07/26/13 0845 07/26/13 1437 07/27/13 0335  NA 140 139 137  --   --  138  K 4.0 4.0 3.2*  --   --  4.5  CL 100 101 97  --   --  100  CO2 27 24 28   --   --  28  GLUCOSE 146* 117* 128*  --   --  103*  BUN 18 18 13   --   --  15  CREATININE 1.40* 1.30 1.18  --  1.12 1.53*  CALCIUM 8.8 8.1* 8.5  --   --  8.5  MG  --  1.8  --  2.1   --   --   PHOS  --  3.2  --   --   --   --    Liver Function Tests:  Recent Labs Lab 07/22/13 2210 07/23/13 0512  AST 30 25  ALT 43 39  ALKPHOS 65 65  BILITOT 1.2 1.0  PROT 7.6 7.1  ALBUMIN 3.9 3.5   No results found for this basename: LIPASE, AMYLASE,  in the last 168 hours No results found for this basename: AMMONIA,  in the last 168 hours CBC:  Recent Labs Lab 07/22/13 2210 07/23/13 0512 07/26/13 1437 07/27/13 0335  WBC 12.1* 9.8 9.8 10.7*  HGB 13.6 13.6 13.3 12.5*  HCT 41.3 41.1 39.2 38.0*  MCV 91.0 92.6 89.7 92.0  PLT 238 239 263 268   Cardiac Enzymes:  Recent Labs Lab 07/22/13 2210 07/23/13 0442 07/23/13 1110 07/23/13 1605  TROPONINI <0.30 <0.30 <0.30 <0.30   BNP (last 3 results)  Recent Labs  07/22/13 2210  PROBNP 575.6*   CBG:  Recent Labs Lab 07/25/13 0725 07/25/13 1138 07/25/13 1741 07/25/13 2039 07/27/13 0817  GLUCAP 100* 88 100* 88 79    Recent Results (from the past 240 hour(s))  RESPIRATORY VIRUS PANEL     Status: None   Collection Time    07/23/13  5:10 AM      Result Value Range Status   Source - [facility]VPAN NASOPHARYNGEAL   Final   Respiratory Syncytial Virus A NOT DETECTED   Final   Respiratory Syncytial Virus B NOT DETECTED   Final   Influenza A NOT DETECTED   Final   Influenza B NOT DETECTED   Final   Parainfluenza 1 NOT DETECTED   Final   Parainfluenza 2 NOT DETECTED   Final   Parainfluenza 3 NOT DETECTED   Final   Metapneumovirus NOT DETECTED   Final   Rhinovirus NOT DETECTED   Final   Adenovirus NOT DETECTED   Final   Influenza A H1 NOT DETECTED   Final   Influenza A H3 NOT DETECTED   Final   Comment: (NOTE)           Normal Reference Range for each Analyte: NOT DETECTED     Testing performed using the Lum[employer] xTAG Respiratory Viral Panel test     kit.     This test was developed and its performance characteristics determined     by [facility]. It has not been cleared or approved by the [place]     Food and  Drug Administration. This test is used for clinical purposes.     It should not be regarded as investigational or for research. This     laboratory is certified under the Clinical Laboratory Improvement     Amendments of  1988 (CLIA) as qualified to perform high complexity     clinical laboratory testing.     Performed at [place]     Status: None   Collection Time    07/23/13  5:12 AM      Result Value Range Status   Specimen Description URINE, CLEAN CATCH   Final   Special Requests ADD   Final   Culture  Setup Time     Final   Value: 07/23/2013 16:02     Performed at [facility] Count     Final   Value: 10,000 COLONIES/ML     Performed at [facility]   Culture     Final   Value: ESCHERICHIA COLI     Performed at [facility]   Report Status 07/25/2013 FINAL   Final   Organism ID, Bacteria ESCHERICHIA COLI   Final     Studies: No results found.  Scheduled Meds: . aspirin EC  81 mg Oral Daily  . carvedilol  6.25 mg Oral BID WC  . docusate sodium  100 mg Oral BID  . guaiFENesin  600 mg Oral BID  . heparin  5,000 Units Subcutaneous Q8H  . hydrochlorothiazide  25 mg Oral Daily  . isosorbide-hydrALAZINE  1 tablet Oral TID  . lisinopril  40 mg Oral Daily  . simvastatin  20 mg Oral q1800  . sodium chloride  3 mL Intravenous Q12H   Continuous Infusions: . sodium chloride    . sodium chloride 75 mL/hr at 07/26/13 1900    Active Problems:   Tachycardia   Shortness of breath   Hypertension   Type II or unspecified type diabetes mellitus without mention of complication, not stated as uncontrolled   Cardiomyopathy   Heart block   Tachy-brady syndrome   Hypokalemia    Time spent: >35 minutes     Kinnie Feil  [facility] Pager [phone]. If 7PM-7AM, please contact night-coverage at [email or web address], password [facility] 07/27/2013, 9:11 AM  LOS: 5 days

## 2013-07-28 DIAGNOSIS — I5021 Acute systolic (congestive) heart failure: Secondary | ICD-10-CM

## 2013-07-28 LAB — BASIC METABOLIC PANEL
BUN: 14 mg/dL (ref 6–23)
CO2: 28 meq/L (ref 19–32)
CREATININE: 1.44 mg/dL — AB (ref 0.50–1.35)
Calcium: 8.6 mg/dL (ref 8.4–10.5)
Chloride: 102 mEq/L (ref 96–112)
GFR calc Af Amer: 70 mL/min — ABNORMAL LOW (ref >90)
GFR calc non Af Amer: 60 mL/min — ABNORMAL LOW (ref >90)
Glucose, Bld: 83 mg/dL (ref 70–99)
Potassium: 4.1 mEq/L (ref 3.7–5.3)
Sodium: 140 mEq/L (ref 137–147)

## 2013-07-28 MED ORDER — ISOSORB DINITRATE-HYDRALAZINE 20-37.5 MG PO TABS: 2.0000 | ORAL_TABLET | Freq: Three times a day (TID) | ORAL | Status: DC

## 2013-07-28 MED ORDER — CARVEDILOL 12.5 MG PO TABS: 12.5000 mg | ORAL_TABLET | Freq: Two times a day (BID) | ORAL | Status: DC

## 2013-07-28 MED ORDER — ASPIRIN 81 MG PO TBEC: 81.0000 mg | DELAYED_RELEASE_TABLET | Freq: Every day | ORAL | Status: DC

## 2013-07-28 MED ORDER — LISINOPRIL 40 MG PO TABS: 40.0000 mg | ORAL_TABLET | Freq: Every day | ORAL | Status: DC

## 2013-07-28 NOTE — Discharge Summary (Signed)
Physician Discharge Summary  Makena Mcgrady IHK:[id number] DOB: Nov 09, 1975 DOA: 07/22/2013  PCP: No primary provider on file.  Admit date: 07/22/2013 Discharge date: 07/28/2013  Time spent: >35 minutes  Recommendations for Outpatient Follow-up:  HF clinic f/u  F/u with PCP in 1 week  Discharge Diagnoses:  Active Problems:   Tachycardia   Shortness of breath   Hypertension   Type II or unspecified type diabetes mellitus without mention of complication, not stated as uncontrolled   Cardiomyopathy   Heart block   Tachy-brady syndrome   Hypokalemia   Acute systolic heart failure   AKI (acute kidney injury)   Discharge Condition: stable   Diet recommendation: heart healthy   Filed Weights   07/22/13 2151 07/23/13 0404 07/26/13 0603  Weight: 136.986 kg (302 lb) 142.566 kg (314 lb 4.8 oz) 137.893 kg (304 lb)    History of present illness:  Brief summary  38 year old male patient with history of hypertension, noncompliant with medications, admitted on 07/23/13 with complaints of chronic intermittent dyspnea which got worse on day of admission. She denied history of chest pain and had some cough. In the ED found to be hypoxic on room air, acute neck and tachycardic in the 120s. Chest x-ray unremarkable. Given height probability for PE, CTA chest was done but poor study-negative for proximal PE, VQ scan negative.    Hospital Course:   1. SOB multifactorial: OSA+Acute systolic CHF; echo: LVEF 75%; s/p LHC: no CAD  - D-dimer negative. Influenza panel PCR negative. CTA chest: Suboptimal study-no evidence of central/central pulmonary embolus and no evidence of pneumonia or interstitial edema. Chest x-ray: Negative. VQ scan normal.  - 2-D echo: LVEF 30-35%, moderate diffuse LV hypokinesis and grade 1 diastolic dysfunction.  - ? Dyspnea secondary to cardiomyopathy,?? Element of acute systolic CHF (BNP on admission 576)  - Hypoxia seems to have resolved, SOB improved   - 2. New  cardiomyopathy/LVEF 30-35%  - Likely secondary to uncontrolled hypertensive and hypertensive heart disease.  - cont current regimen; increased ACE per cardiology, f/u BMP next week; not on loop diuretics, lung are clear on exam  3. Tachy Brady/possible high degree AV block  - started BB., cont titrate OP per cardiology  5. Uncontrolled Hypertension  - controlled on current regimen, f/u labs next week   6. Type II or unspecified type diabetes mellitus without mention of complication, not stated as uncontrolled  - stable. resume metformin as an outpatient.  7. Possible OSA  - Has not had sleep study. Will try obtain CPAP prior to d/c; sleep study as OP 8. AKI likely contrast; improving; recheck renal function next week    Procedures:  LHC: no CAD (i.e. Studies not automatically included, echos, thoracentesis, etc; not x-rays)  Consultations:  Cardiology   Discharge Exam: Filed Vitals:   07/28/13 0620  BP: 130/80  Pulse: 94  Temp: 97.8 F (36.6 C)  Resp: 18    General: alert Cardiovascular: s1,s2 rrr Respiratory: CTA BL  Discharge Instructions  Discharge Orders   Future Orders Complete By Expires   Diet - low sodium heart healthy  As directed    Discharge instructions  As directed    Comments:     Please follow up with heart failure clinic next week  -please follow up with primary care doctor in 1 week   Increase activity slowly  As directed        Medication List         aspirin 81 MG EC tablet  Take 1 tablet (81 mg total) by mouth daily.     aspirin-acetaminophen-caffeine 250-250-65 MG per tablet  Commonly known as:  EXCEDRIN MIGRAINE  Take 2 tablets by mouth daily as needed for headache.     carvedilol 12.5 MG tablet  Commonly known as:  COREG  Take 1 tablet (12.5 mg total) by mouth 2 (two) times daily with a meal.     hydrochlorothiazide 25 MG tablet  Commonly known as:  HYDRODIURIL  Take 25 mg by mouth daily.     isosorbide-hydrALAZINE 20-37.5 MG  per tablet  Commonly known as:  BIDIL  Take 2 tablets by mouth 3 (three) times daily.     lisinopril 40 MG tablet  Commonly known as:  PRINIVIL,ZESTRIL  Take 1 tablet (40 mg total) by mouth daily.     metFORMIN 500 MG tablet  Commonly known as:  GLUCOPHAGE  Take 500 mg by mouth daily.     pravastatin 40 MG tablet  Commonly known as:  PRAVACHOL  Take 40 mg by mouth daily.       No Known Allergies     Follow-up Information   Follow up with [facility]. Schedule an appointment as soon as possible for a visit in 1 week.   Specialty:  Cardiology   Contact information:   [address] [id number] [place] [address] [phone]      Follow up with [facility]     In 1 week.   Contact information:   [place] [address] [phone]       The results of significant diagnostics from this hospitalization (including imaging, microbiology, ancillary and laboratory) are listed below for reference.    Significant Diagnostic Studies: Dg Chest 2 View  07/22/2013   CLINICAL DATA:  Shortness of breath.  EXAM: CHEST  2 VIEW  COMPARISON:  Chest radiograph performed 10/15/2003  FINDINGS: The lungs are well-aerated and clear. There is no evidence of focal opacification, pleural effusion or pneumothorax.  The heart is normal in size; the mediastinal contour is within normal limits. No acute osseous abnormalities are seen.  IMPRESSION: No acute cardiopulmonary process seen.   Electronically Signed   By: Garald Balding M.D.   On: 07/22/2013 23:13   Ct Angio Chest Pe W/cm &/or Wo Cm  07/23/2013   CLINICAL DATA:  Shortness of breath, hypoxia, low-grade fever, productive cough  EXAM: CT ANGIOGRAPHY CHEST WITH CONTRAST  TECHNIQUE: Multidetector CT imaging of the chest was performed using the standard protocol during bolus administration of intravenous contrast. Multiplanar CT image  reconstructions including MIPs were obtained to evaluate the vascular anatomy.  CONTRAST:  13mL OMNIPAQUE IOHEXOL 350 MG/ML SOLN  COMPARISON:  Chest radiographs dated 07/22/2013  FINDINGS: Markedly suboptimal evaluation of the pulmonary arteries secondary to poor bolus timing. At the time of imaging, the vast majority of contrast was in the aorta. Study was subsequently repeated without appreciable improvement.  Although there is no central/saddle embolus, pulmonary embolism cannot be excluded starting with the lobar pulmonary arteries.  The lungs are clear. No suspicious pulmonary nodules. No focal consolidation. No interstitial edema. No pleural effusion or pneumothorax.  Visualized thyroid is unremarkable.  The heart is normal in size.  No pericardial effusion.  No suspicious mediastinal, hilar, or axillary lymphadenopathy.  Visualized upper abdomen is notable for mild hepatic steatosis.  Visualized osseous structures are within normal limits.  Review of the MIP images confirms the  above findings.  IMPRESSION: Markedly suboptimal evaluation of the pulmonary arteries secondary to poor bolus timing.  No evidence of central/saddle pulmonary embolus. Remainder of the pulmonary arteries cannot be adequately evaluated.  No evidence of pneumonia or interstitial edema.  These results were called by telephone at the time of interpretation on 07/23/2013 at 12:54 AM to Dr. Veatrice Kells, who verbally acknowledged these results.   Electronically Signed   By: Julian Hy M.D.   On: 07/23/2013 00:59   Nm Pulmonary Perf And Vent  07/23/2013   CLINICAL DATA:  Shortness of breath.  Hypoxia.  EXAM: NUCLEAR MEDICINE VENTILATION - PERFUSION LUNG SCAN  TECHNIQUE: Ventilation images were obtained in multiple projections using inhaled aerosol technetium 99 M DTPA. Perfusion images were obtained in multiple projections after intravenous injection of Tc-71mMAA.  COMPARISON:  Chest radiograph on 07/22/2013  RADIOPHARMACEUTICALS:   40 mCi Tc-957mTPA aerosol and 6 mCi Tc-99104mA  FINDINGS: Ventilation: No focal ventilation defect.  Perfusion: No wedge shaped peripheral perfusion defects to suggest acute pulmonary embolism.  IMPRESSION: Normal study.  No signs of pulmonary embolism.   Electronically Signed   By: JohEarle GellD.   On: 07/23/2013 10:17    Microbiology: Recent Results (from the past 240 hour(s))  RESPIRATORY VIRUS PANEL     Status: None   Collection Time    07/23/13  5:10 AM      Result Value Range Status   Source - RVPAN NASOPHARYNGEAL   Final   Respiratory Syncytial Virus A NOT DETECTED   Final   Respiratory Syncytial Virus B NOT DETECTED   Final   Influenza A NOT DETECTED   Final   Influenza B NOT DETECTED   Final   Parainfluenza 1 NOT DETECTED   Final   Parainfluenza 2 NOT DETECTED   Final   Parainfluenza 3 NOT DETECTED   Final   Metapneumovirus NOT DETECTED   Final   Rhinovirus NOT DETECTED   Final   Adenovirus NOT DETECTED   Final   Influenza A H1 NOT DETECTED   Final   Influenza A H3 NOT DETECTED   Final   Comment: (NOTE)           Normal Reference Range for each Analyte: NOT DETECTED     Testing performed using the Luminex xTAG Respiratory Viral Panel test     kit.     This test was developed and its performance characteristics determined     by [facility] has not been cleared or approved by the [place]  Food and Drug Administration. This test is used for clinical purposes.     It should not be regarded as investigational or for research. This     laboratory is certified under the [employer] [place]) as qualified to perform high complexity     clinical laboratory testing.     Performed at [facility]  Status: None   Collection Time    07/23/13  5:12 AM      Result Value Range Status   Specimen Description URINE, CLEAN CATCH   Final   Special Requests ADD   Final   Culture  Setup Time     Final    Value: 07/23/2013 16:02     Performed at [facility]  Final   Value: 10,000 COLONIES/ML  Performed at [facility]   Culture     Final   Value: ESCHERICHIA COLI     Performed at [facility]   Report Status 07/25/2013 FINAL   Final   Organism ID, Bacteria ESCHERICHIA COLI   Final     Labs: Basic Metabolic Panel:  Recent Labs Lab 07/22/13 2210 07/23/13 0512 07/26/13 0300 07/26/13 0845 07/26/13 1437 07/27/13 0335 07/28/13 0619  NA 140 139 137  --   --  138 140  K 4.0 4.0 3.2*  --   --  4.5 4.1  CL 100 101 97  --   --  100 102  CO2 27 24 28   --   --  28 28  GLUCOSE 146* 117* 128*  --   --  103* 83  BUN 18 18 13   --   --  15 14  CREATININE 1.40* 1.30 1.18  --  1.12 1.53* 1.44*  CALCIUM 8.8 8.1* 8.5  --   --  8.5 8.6  MG  --  1.8  --  2.1  --   --   --   PHOS  --  3.2  --   --   --   --   --    Liver Function Tests:  Recent Labs Lab 07/22/13 2210 07/23/13 0512  AST 30 25  ALT 43 39  ALKPHOS 65 65  BILITOT 1.2 1.0  PROT 7.6 7.1  ALBUMIN 3.9 3.5   No results found for this basename: LIPASE, AMYLASE,  in the last 168 hours No results found for this basename: AMMONIA,  in the last 168 hours CBC:  Recent Labs Lab 07/22/13 2210 07/23/13 0512 07/26/13 1437 07/27/13 0335  WBC 12.1* 9.8 9.8 10.7*  HGB 13.6 13.6 13.3 12.5*  HCT 41.3 41.1 39.2 38.0*  MCV 91.0 92.6 89.7 92.0  PLT 238 239 263 268   Cardiac Enzymes:  Recent Labs Lab 07/22/13 2210 07/23/13 0442 07/23/13 1110 07/23/13 1605  TROPONINI <0.30 <0.30 <0.30 <0.30   BNP: BNP (last 3 results)  Recent Labs  07/22/13 2210  PROBNP 575.6*   CBG:  Recent Labs Lab 07/25/13 0725 07/25/13 1138 07/25/13 1741 07/25/13 2039 07/27/13 0817  GLUCAP 100* 88 100* 88 79       Signed:  Monifa Blanchette N  [facility] 07/28/2013, 8:51 AM

## 2013-08-04 ENCOUNTER — Ambulatory Visit (HOSPITAL_COMMUNITY)
Admit: 2013-08-04 | Discharge: 2013-08-04 | Disposition: A | Discharge status: Home / Self Care | Payer: Self-pay | Attending: Internal Medicine | Admitting: Internal Medicine

## 2013-08-04 ENCOUNTER — Other Ambulatory Visit (HOSPITAL_COMMUNITY): Payer: Self-pay

## 2013-08-04 ENCOUNTER — Encounter (HOSPITAL_COMMUNITY): Payer: Self-pay

## 2013-08-04 VITALS — BP 109/73 | HR 85 | Resp 16 | Wt 304.0 lb

## 2013-08-04 DIAGNOSIS — I10 Essential (primary) hypertension: Secondary | ICD-10-CM | POA: Insufficient documentation

## 2013-08-04 DIAGNOSIS — I5021 Acute systolic (congestive) heart failure: Secondary | ICD-10-CM

## 2013-08-04 DIAGNOSIS — F529 Unspecified sexual dysfunction not due to a substance or known physiological condition: Secondary | ICD-10-CM

## 2013-08-04 DIAGNOSIS — I5022 Chronic systolic (congestive) heart failure: Secondary | ICD-10-CM | POA: Insufficient documentation

## 2013-08-04 DIAGNOSIS — N539 Unspecified male sexual dysfunction: Secondary | ICD-10-CM

## 2013-08-04 MED ORDER — SPIRONOLACTONE 25 MG PO TABS: 25.0000 mg | ORAL_TABLET | Freq: Every day | ORAL | Status: DC

## 2013-08-04 NOTE — Progress Notes (Addendum)
Patient ID: Vernon Reed, male   DOB: [birth year], 38 y.o.   MRN: [id number]  PCP: None  HPI: Vernon Reed is a 38 yo male with a history of HTN, HLD, ?OSA , obesity and chronic systolic HF.   Admitted to the hospital 1/16-1/20/15 for SOB. Had L/RHC with normal coronaries.   RA 10 RV 36/10/13 PA 34/16 (23) PCWP 20 Fick CO/CI 6.18/2.4  ECHO 07/2013: EF 30-35%  Follow up: Doing well since discharge. Minimal fatigue. Denies SOB, orthopnea, CP or edema. Following a low salt diet and drinking less than 2L a day. Taking medications as prescribed. Sleep study scheduled 08/23/13. Denies palpitations.   SH: Works for [employer]  FH: Mother- HTN, HLD       Father- HTN  ROS: All systems negative except as listed in HPI, PMH and Problem List.  Past Medical History  Diagnosis Date  . Hypertension     Current Outpatient Prescriptions  Medication Sig Dispense Refill  . aspirin EC 81 MG EC tablet Take 1 tablet (81 mg total) by mouth daily.  30 tablet  2  . aspirin-acetaminophen-caffeine (EXCEDRIN MIGRAINE) 250-250-65 MG per tablet Take 2 tablets by mouth daily as needed for headache.      . carvedilol (COREG) 12.5 MG tablet Take 1 tablet (12.5 mg total) by mouth 2 (two) times daily with a meal.  60 tablet  3  . hydrochlorothiazide (HYDRODIURIL) 25 MG tablet Take 25 mg by mouth daily.      . isosorbide-hydrALAZINE (BIDIL) 20-37.5 MG per tablet Take 2 tablets by mouth 3 (three) times daily.  60 tablet  2  . lisinopril (PRINIVIL,ZESTRIL) 40 MG tablet Take 1 tablet (40 mg total) by mouth daily.  30 tablet  1  . metFORMIN (GLUCOPHAGE) 500 MG tablet Take 500 mg by mouth daily.      . pravastatin (PRAVACHOL) 40 MG tablet Take 40 mg by mouth daily.       No current facility-administered medications for this encounter.    Filed Vitals:   08/04/13 1547  BP: 109/73  Pulse: 85  Resp: 16  Weight: 304 lb (137.893 kg)  SpO2: 94%   PHYSICAL EXAM: General:  Well appearing. No resp  difficulty HEENT: normal Neck: supple. JVP flat. Carotids 2+ bilaterally; no bruits. No lymphadenopathy or thryomegaly appreciated. Cor: PMI normal. Regular rate & rhythm. No rubs, gallops or murmurs. Lungs: clear Abdomen: obese, soft, nontender, nondistended. No hepatosplenomegaly. No bruits or masses. Good bowel sounds. Extremities: no cyanosis, clubbing, rash, edema Neuro: alert & orientedx3, cranial nerves grossly intact. Moves all 4 extremities w/o difficulty. Affect pleasant.   ASSESSMENT & PLAN:  1) Chronic systolic HF: NICM, EF 30-35% - Vernon Reed is a newly diagnosed HF clinic. He had a R/LHC with normal hemodynamics and normal coronaries. - NYHA I symptoms and volume status stable. Will stop HCTZ and switch to lasix 20 mg daily. Discussed with the patient if he has a 3 lb weight gain in a day or 5 lbs in a week to take an extra lasix. - Continue coreg 12.5 mg BID, will not titrate d/t sexual dysfunction. - At goal dose lisinopril 40 mg daily. - Stop Bidil, patient would like prescription for Viagra which I have provided.  - Start Spironolactone 25 mg daily and check BMET 7-10 days. - Reinforced the need and importance of daily weights, a low sodium diet, and fluid restriction (less than 2 L a day). Instructed to call the HF clinic if weight increases  more than 3 lbs overnight or 5 lbs in a week.  2) HTN - controlled on medications. As above will change from Bidil to spiro in order for patient to use Viagra. 3) Sexual dysfunction - Will provide script for 100 mg Viagra # 5. Likely related to BB. 4) ?OSA - Follow up for sleep study in Feb. He has an appointment.  F/U 1 month Ulla [place], Kehinde Totzke B NP-C 4:41 PM

## 2013-08-04 NOTE — Patient Instructions (Addendum)
Stop your hydrochlorothiazide.  Start taking lasix 20 mg daily.  Stop Bidil  Start taking Spironolactone 25 mg daily.  Check labs in 7-10 days at [facility], 3rd floor anytime.  Follow up 1 month  Do the following things EVERYDAY: 1) Weigh yourself in the morning before breakfast. Write it down and keep it in a log. 2) Take your medicines as prescribed 3) Eat low salt foods-Limit salt (sodium) to 2000 mg per day.  4) Stay as active as you can everyday 5) Limit all fluids for the day to less than 2 liters

## 2013-08-23 ENCOUNTER — Ambulatory Visit (HOSPITAL_BASED_OUTPATIENT_CLINIC_OR_DEPARTMENT_OTHER): Payer: Self-pay

## 2013-08-24 ENCOUNTER — Ambulatory Visit (HOSPITAL_BASED_OUTPATIENT_CLINIC_OR_DEPARTMENT_OTHER): Payer: Self-pay | Attending: Internal Medicine

## 2013-08-31 ENCOUNTER — Encounter (HOSPITAL_COMMUNITY): Payer: Self-pay

## 2013-08-31 ENCOUNTER — Inpatient Hospital Stay (HOSPITAL_COMMUNITY): Admission: RE | Admit: 2013-08-31 | Payer: Self-pay | Source: Ambulatory Visit

## 2013-09-19 ENCOUNTER — Encounter (HOSPITAL_BASED_OUTPATIENT_CLINIC_OR_DEPARTMENT_OTHER): Payer: Self-pay | Admitting: Emergency Medicine

## 2013-09-19 ENCOUNTER — Emergency Department (HOSPITAL_BASED_OUTPATIENT_CLINIC_OR_DEPARTMENT_OTHER)
Admission: EM | Admit: 2013-09-19 | Discharge: 2013-09-20 | Disposition: A | Discharge status: Home / Self Care | Payer: Self-pay | Attending: Emergency Medicine | Admitting: Emergency Medicine

## 2013-09-19 DIAGNOSIS — Z79899 Other long term (current) drug therapy: Secondary | ICD-10-CM | POA: Insufficient documentation

## 2013-09-19 DIAGNOSIS — L03319 Cellulitis of trunk, unspecified: Principal | ICD-10-CM

## 2013-09-19 DIAGNOSIS — Z7982 Long term (current) use of aspirin: Secondary | ICD-10-CM | POA: Insufficient documentation

## 2013-09-19 DIAGNOSIS — L03311 Cellulitis of abdominal wall: Secondary | ICD-10-CM

## 2013-09-19 DIAGNOSIS — I10 Essential (primary) hypertension: Secondary | ICD-10-CM | POA: Insufficient documentation

## 2013-09-19 DIAGNOSIS — L02219 Cutaneous abscess of trunk, unspecified: Secondary | ICD-10-CM | POA: Insufficient documentation

## 2013-09-19 NOTE — ED Notes (Addendum)
Skin infection to his lower abdomen. States it started as a 2" circle and got bigger over nights. Itching, red, hot to touch. BP noted. Pt states he has not taken his BP medication in a few days.

## 2013-09-19 NOTE — ED Notes (Signed)
?   Insect bite to rt abd yesterday,  Area red and warm to touch, itching   Denies pain

## 2013-09-20 MED ORDER — CEPHALEXIN 500 MG PO CAPS: 500.0000 mg | ORAL_CAPSULE | Freq: Four times a day (QID) | ORAL | Status: DC

## 2013-09-20 MED ORDER — CEPHALEXIN 250 MG PO CAPS
500.0000 mg | ORAL_CAPSULE | Freq: Once | ORAL | Status: AC
  Administered 2013-09-20: 500 mg via ORAL
  Filled 2013-09-20: qty 2

## 2013-09-20 NOTE — Discharge Instructions (Signed)
Cellulitis °Cellulitis is an infection of the skin and the tissue under the skin. The infected area is usually red and tender. This happens most often in the arms and lower legs. °HOME CARE  °· Take your antibiotic medicine as told. Finish the medicine even if you start to feel better. °· Keep the infected arm or leg raised (elevated). °· Put a warm cloth on the area up to 4 times per day. °· Only take medicines as told by your doctor. °· Keep all doctor visits as told. °GET HELP RIGHT AWAY IF:  °· You have a fever. °· You feel very sleepy. °· You throw up (vomit) or have watery poop (diarrhea). °· You feel sick and have muscle aches and pains. °· You see red streaks on the skin coming from the infected area. °· Your red area gets bigger or turns a dark color. °· Your bone or joint under the infected area is painful after the skin heals. °· Your infection comes back in the same area or different area. °· You have a puffy (swollen) bump in the infected area. °· You have new symptoms. °MAKE SURE YOU:  °· Understand these instructions. °· Will watch your condition. °· Will get help right away if you are not doing well or get worse. °Document Released: 12/10/2007 Document Revised: 12/23/2011 Document Reviewed: 09/08/2011 °ExitCare® Patient Information ©2014 [employer]. ° °

## 2013-09-20 NOTE — ED Provider Notes (Signed)
CSN: [id number]     Arrival date & time 09/19/13  2039 History   First MD Initiated Contact with Patient 09/19/13 2350     Chief Complaint  Patient presents with  . Cellulitis     (Consider location/radiation/quality/duration/timing/severity/associated sxs/prior Treatment) Patient is a 38 y.o. male presenting with rash. The history is provided by the patient. No language interpreter was used.  Rash Associated symptoms: no abdominal pain, no fever, no nausea and not vomiting   Associated symptoms comment:  He noticed an area on abdominal wall that was pruritic and now is red and warm to touch. No fever, N, V. No draining wound or swelling.    Past Medical History  Diagnosis Date  . Hypertension    Past Surgical History  Procedure Laterality Date  . Appendectomy    . Abdominal surgery      tumor removed from abd. non milignant   Family History  Problem Relation Age of Onset  . Hypertension Mother   . CAD Mother   . Hypertension Father    History  Substance Use Topics  . Smoking status: Never Smoker   . Smokeless tobacco: Not on file  . Alcohol Use: Yes     Comment: occasionally    Review of Systems  Constitutional: Negative for fever.  Gastrointestinal: Negative for nausea, vomiting and abdominal pain.  Musculoskeletal: Negative.   Skin: Positive for color change.       See HPI.      Allergies  Review of patient's allergies indicates no known allergies.  Home Medications   Current Outpatient Rx  Name  Route  Sig  Dispense  Refill  . aspirin EC 81 MG EC tablet   Oral   Take 1 tablet (81 mg total) by mouth daily.   30 tablet   2   . aspirin-acetaminophen-caffeine (EXCEDRIN MIGRAINE) 250-250-65 MG per tablet   Oral   Take 2 tablets by mouth daily as needed for headache.         . carvedilol (COREG) 12.5 MG tablet   Oral   Take 1 tablet (12.5 mg total) by mouth 2 (two) times daily with a meal.   60 tablet   3   . lisinopril (PRINIVIL,ZESTRIL) 40 MG  tablet   Oral   Take 1 tablet (40 mg total) by mouth daily.   30 tablet   1   . metFORMIN (GLUCOPHAGE) 500 MG tablet   Oral   Take 500 mg by mouth daily.         . pravastatin (PRAVACHOL) 40 MG tablet   Oral   Take 40 mg by mouth daily.         Marland Kitchen. spironolactone (ALDACTONE) 25 MG tablet   Oral   Take 1 tablet (25 mg total) by mouth daily.   30 tablet   3    BP 165/120  Pulse 87  Temp(Src) 98.4 F (36.9 C) (Oral)  Resp 20  Ht 6' (1.829 m)  Wt 304 lb (137.893 kg)  BMI 41.22 kg/m2  SpO2 97% Physical Exam  Constitutional: He is oriented to person, place, and time. He appears well-developed and well-nourished.  Neck: Normal range of motion.  Pulmonary/Chest: Effort normal.  Abdominal: Soft. There is no tenderness. There is no rebound and no guarding.  Musculoskeletal: Normal range of motion.  Neurological: He is alert and oriented to person, place, and time.  Skin: Skin is warm and dry.  5 cm in diameter area to left periumbilical abdomen that  is red, warm to touch. There are 2 linear, superficial uclercations. No induration or fluctuance.   Psychiatric: He has a normal mood and affect.    ED Course  Procedures (including critical care time) Labs Review Labs Reviewed - No data to display Imaging Review No results found.   EKG Interpretation None      MDM   Final diagnoses:  None    1. Cellulitis, abdominal wall  There is minimal tenderness to area that appears cellulitis, without evidence of abscess. He is afebrile and well appearing. Blood pressure elevated. He reports he ran out of medication but picked up his prescription today. No chest pain, SOB.   Arnoldo Hooker, PA-C 09/20/13 0013

## 2013-09-21 NOTE — ED Provider Notes (Signed)
Medical screening examination/treatment/procedure(s) were performed by non-physician practitioner and as supervising physician I was immediately available for consultation/collaboration.  Shanna Cisco, MD 09/21/13 [phone]

## 2013-12-27 ENCOUNTER — Emergency Department (HOSPITAL_COMMUNITY): Payer: Self-pay

## 2013-12-27 ENCOUNTER — Encounter (HOSPITAL_COMMUNITY): Payer: Self-pay | Admitting: Emergency Medicine

## 2013-12-27 ENCOUNTER — Inpatient Hospital Stay (HOSPITAL_COMMUNITY): Payer: Self-pay

## 2013-12-27 ENCOUNTER — Inpatient Hospital Stay (HOSPITAL_COMMUNITY)
Admission: EM | Admit: 2013-12-27 | Discharge: 2013-12-29 | DRG: 291 | Disposition: A | Discharge status: Home / Self Care | Payer: Self-pay | Attending: Internal Medicine | Admitting: Internal Medicine

## 2013-12-27 DIAGNOSIS — J9600 Acute respiratory failure, unspecified whether with hypoxia or hypercapnia: Secondary | ICD-10-CM | POA: Diagnosis present

## 2013-12-27 DIAGNOSIS — R0902 Hypoxemia: Secondary | ICD-10-CM

## 2013-12-27 DIAGNOSIS — Z8249 Family history of ischemic heart disease and other diseases of the circulatory system: Secondary | ICD-10-CM

## 2013-12-27 DIAGNOSIS — Z7982 Long term (current) use of aspirin: Secondary | ICD-10-CM

## 2013-12-27 DIAGNOSIS — I5021 Acute systolic (congestive) heart failure: Secondary | ICD-10-CM

## 2013-12-27 DIAGNOSIS — I159 Secondary hypertension, unspecified: Secondary | ICD-10-CM

## 2013-12-27 DIAGNOSIS — Z91199 Patient's noncompliance with other medical treatment and regimen due to unspecified reason: Secondary | ICD-10-CM

## 2013-12-27 DIAGNOSIS — Z9119 Patient's noncompliance with other medical treatment and regimen: Secondary | ICD-10-CM

## 2013-12-27 DIAGNOSIS — R0602 Shortness of breath: Secondary | ICD-10-CM

## 2013-12-27 DIAGNOSIS — I459 Conduction disorder, unspecified: Secondary | ICD-10-CM

## 2013-12-27 DIAGNOSIS — Z6841 Body Mass Index (BMI) 40.0 and over, adult: Secondary | ICD-10-CM

## 2013-12-27 DIAGNOSIS — E662 Morbid (severe) obesity with alveolar hypoventilation: Secondary | ICD-10-CM | POA: Diagnosis present

## 2013-12-27 DIAGNOSIS — E785 Hyperlipidemia, unspecified: Secondary | ICD-10-CM | POA: Diagnosis present

## 2013-12-27 DIAGNOSIS — I428 Other cardiomyopathies: Secondary | ICD-10-CM | POA: Diagnosis present

## 2013-12-27 DIAGNOSIS — I429 Cardiomyopathy, unspecified: Secondary | ICD-10-CM

## 2013-12-27 DIAGNOSIS — I495 Sick sinus syndrome: Secondary | ICD-10-CM

## 2013-12-27 DIAGNOSIS — N179 Acute kidney failure, unspecified: Secondary | ICD-10-CM

## 2013-12-27 DIAGNOSIS — I5023 Acute on chronic systolic (congestive) heart failure: Principal | ICD-10-CM

## 2013-12-27 DIAGNOSIS — I509 Heart failure, unspecified: Secondary | ICD-10-CM | POA: Diagnosis present

## 2013-12-27 DIAGNOSIS — G4733 Obstructive sleep apnea (adult) (pediatric): Secondary | ICD-10-CM | POA: Diagnosis present

## 2013-12-27 DIAGNOSIS — E876 Hypokalemia: Secondary | ICD-10-CM

## 2013-12-27 DIAGNOSIS — E119 Type 2 diabetes mellitus without complications: Secondary | ICD-10-CM

## 2013-12-27 DIAGNOSIS — I10 Essential (primary) hypertension: Secondary | ICD-10-CM

## 2013-12-27 DIAGNOSIS — J9601 Acute respiratory failure with hypoxia: Secondary | ICD-10-CM

## 2013-12-27 DIAGNOSIS — R000 Tachycardia, unspecified: Secondary | ICD-10-CM

## 2013-12-27 HISTORY — DX: Morbid (severe) obesity due to excess calories: E66.01

## 2013-12-27 LAB — D-DIMER, QUANTITATIVE (NOT AT ARMC): D DIMER QUANT: 0.45 ug{FEU}/mL (ref 0.00–0.48)

## 2013-12-27 LAB — BASIC METABOLIC PANEL
BUN: 14 mg/dL (ref 6–23)
CHLORIDE: 101 meq/L (ref 96–112)
CO2: 25 mEq/L (ref 19–32)
Calcium: 9 mg/dL (ref 8.4–10.5)
Creatinine, Ser: 1.3 mg/dL (ref 0.50–1.35)
GFR calc non Af Amer: 68 mL/min — ABNORMAL LOW (ref >90)
GFR, EST AFRICAN AMERICAN: 79 mL/min — AB (ref >90)
Glucose, Bld: 124 mg/dL — ABNORMAL HIGH (ref 70–99)
POTASSIUM: 3.8 meq/L (ref 3.7–5.3)
Sodium: 141 mEq/L (ref 137–147)

## 2013-12-27 LAB — CBC
HCT: 42.5 % (ref 39.0–52.0)
HEMATOCRIT: 40.6 % (ref 39.0–52.0)
Hemoglobin: 13.3 g/dL (ref 13.0–17.0)
Hemoglobin: 14.1 g/dL (ref 13.0–17.0)
MCH: 29.9 pg (ref 26.0–34.0)
MCH: 30.1 pg (ref 26.0–34.0)
MCHC: 32.8 g/dL (ref 30.0–36.0)
MCHC: 33.2 g/dL (ref 30.0–36.0)
MCV: 90.6 fL (ref 78.0–100.0)
MCV: 91.2 fL (ref 78.0–100.0)
PLATELETS: 270 10*3/uL (ref 150–400)
PLATELETS: 271 10*3/uL (ref 150–400)
RBC: 4.45 MIL/uL (ref 4.22–5.81)
RBC: 4.69 MIL/uL (ref 4.22–5.81)
RDW: 13.4 % (ref 11.5–15.5)
RDW: 13.4 % (ref 11.5–15.5)
WBC: 10.1 10*3/uL (ref 4.0–10.5)
WBC: 11.3 10*3/uL — AB (ref 4.0–10.5)

## 2013-12-27 LAB — GLUCOSE, CAPILLARY
GLUCOSE-CAPILLARY: 123 mg/dL — AB (ref 70–99)
Glucose-Capillary: 187 mg/dL — ABNORMAL HIGH (ref 70–99)
Glucose-Capillary: 96 mg/dL (ref 70–99)

## 2013-12-27 LAB — PRO B NATRIURETIC PEPTIDE
PRO B NATRI PEPTIDE: 688.2 pg/mL — AB (ref 0–125)
Pro B Natriuretic peptide (BNP): 777.4 pg/mL — ABNORMAL HIGH (ref 0–125)

## 2013-12-27 LAB — HEMOGLOBIN A1C
HEMOGLOBIN A1C: 6.7 % — AB (ref <5.7)
Mean Plasma Glucose: 146 mg/dL — ABNORMAL HIGH (ref <117)

## 2013-12-27 LAB — CREATININE, SERUM
CREATININE: 1.17 mg/dL (ref 0.50–1.35)
GFR, EST AFRICAN AMERICAN: 89 mL/min — AB (ref >90)
GFR, EST NON AFRICAN AMERICAN: 77 mL/min — AB (ref >90)

## 2013-12-27 LAB — I-STAT TROPONIN, ED: TROPONIN I, POC: 0.02 ng/mL (ref 0.00–0.08)

## 2013-12-27 LAB — MAGNESIUM: MAGNESIUM: 2 mg/dL (ref 1.5–2.5)

## 2013-12-27 LAB — TSH: TSH: 0.918 u[IU]/mL (ref 0.350–4.500)

## 2013-12-27 MED ORDER — HYDRALAZINE HCL 25 MG PO TABS
25.0000 mg | ORAL_TABLET | Freq: Three times a day (TID) | ORAL | Status: DC
  Filled 2013-12-27 (×3): qty 1

## 2013-12-27 MED ORDER — SODIUM CHLORIDE 0.9 % IJ SOLN: 3.0000 mL | INTRAMUSCULAR | Status: DC | PRN

## 2013-12-27 MED ORDER — INSULIN ASPART 100 UNIT/ML ~~LOC~~ SOLN
0.0000 [IU] | Freq: Three times a day (TID) | SUBCUTANEOUS | Status: DC
  Administered 2013-12-27: 3 [IU] via SUBCUTANEOUS
  Administered 2013-12-28: 2 [IU] via SUBCUTANEOUS

## 2013-12-27 MED ORDER — ALBUTEROL SULFATE (2.5 MG/3ML) 0.083% IN NEBU
5.0000 mg | INHALATION_SOLUTION | RESPIRATORY_TRACT | Status: AC
  Administered 2013-12-27: 5 mg via RESPIRATORY_TRACT

## 2013-12-27 MED ORDER — FUROSEMIDE 10 MG/ML IJ SOLN
40.0000 mg | Freq: Two times a day (BID) | INTRAMUSCULAR | Status: DC
  Administered 2013-12-27: 40 mg via INTRAVENOUS

## 2013-12-27 MED ORDER — ASPIRIN EC 81 MG PO TBEC
81.0000 mg | DELAYED_RELEASE_TABLET | Freq: Every day | ORAL | Status: DC
  Administered 2013-12-27 – 2013-12-29 (×3): 81 mg via ORAL
  Filled 2013-12-27 (×3): qty 1

## 2013-12-27 MED ORDER — CARVEDILOL 12.5 MG PO TABS
12.5000 mg | ORAL_TABLET | Freq: Two times a day (BID) | ORAL | Status: DC
  Administered 2013-12-27: 12.5 mg via ORAL
  Filled 2013-12-27 (×3): qty 1

## 2013-12-27 MED ORDER — FUROSEMIDE 10 MG/ML IJ SOLN
40.0000 mg | Freq: Three times a day (TID) | INTRAMUSCULAR | Status: DC
  Administered 2013-12-27: 40 mg via INTRAVENOUS

## 2013-12-27 MED ORDER — SODIUM CHLORIDE 0.9 % IV SOLN: 250.0000 mL | INTRAVENOUS | Status: DC | PRN

## 2013-12-27 MED ORDER — FUROSEMIDE 10 MG/ML IJ SOLN
40.0000 mg | Freq: Once | INTRAMUSCULAR | Status: AC
  Administered 2013-12-27: 40 mg via INTRAVENOUS
  Filled 2013-12-27: qty 4

## 2013-12-27 MED ORDER — SODIUM CHLORIDE 0.9 % IJ SOLN
3.0000 mL | Freq: Two times a day (BID) | INTRAMUSCULAR | Status: DC
  Administered 2013-12-27 – 2013-12-29 (×5): 3 mL via INTRAVENOUS

## 2013-12-27 MED ORDER — CARVEDILOL 12.5 MG PO TABS
12.5000 mg | ORAL_TABLET | Freq: Two times a day (BID) | ORAL | Status: DC
  Administered 2013-12-28 – 2013-12-29 (×3): 12.5 mg via ORAL
  Filled 2013-12-27 (×5): qty 1

## 2013-12-27 MED ORDER — ISOSORB DINITRATE-HYDRALAZINE 20-37.5 MG PO TABS
1.0000 | ORAL_TABLET | Freq: Two times a day (BID) | ORAL | Status: DC
  Filled 2013-12-27 (×2): qty 1

## 2013-12-27 MED ORDER — AMLODIPINE BESYLATE 5 MG PO TABS
5.0000 mg | ORAL_TABLET | Freq: Every day | ORAL | Status: DC
  Administered 2013-12-27: 5 mg via ORAL
  Filled 2013-12-27 (×2): qty 1

## 2013-12-27 MED ORDER — ISOSORB DINITRATE-HYDRALAZINE 20-37.5 MG PO TABS
1.0000 | ORAL_TABLET | Freq: Three times a day (TID) | ORAL | Status: DC
  Administered 2013-12-27: 1 via ORAL
  Filled 2013-12-27 (×2): qty 1

## 2013-12-27 MED ORDER — ALBUTEROL (5 MG/ML) CONTINUOUS INHALATION SOLN
10.0000 mg/h | INHALATION_SOLUTION | Freq: Once | RESPIRATORY_TRACT | Status: DC
Start: 2013-12-27 — End: 2013-12-27
  Filled 2013-12-27: qty 40

## 2013-12-27 MED ORDER — HEPARIN SODIUM (PORCINE) 5000 UNIT/ML IJ SOLN: 5000.0000 [IU] | Freq: Three times a day (TID) | INTRAMUSCULAR | Status: DC

## 2013-12-27 MED ORDER — PREDNISONE 20 MG PO TABS
60.0000 mg | ORAL_TABLET | Freq: Once | ORAL | Status: AC
  Administered 2013-12-27: 60 mg via ORAL
  Filled 2013-12-27: qty 3

## 2013-12-27 MED ORDER — HYDRALAZINE HCL 20 MG/ML IJ SOLN: 10.0000 mg | Freq: Four times a day (QID) | INTRAMUSCULAR | Status: DC | PRN

## 2013-12-27 MED ORDER — CARVEDILOL 6.25 MG PO TABS
18.7500 mg | ORAL_TABLET | Freq: Two times a day (BID) | ORAL | Status: DC
  Administered 2013-12-27: 18.75 mg via ORAL
  Filled 2013-12-27 (×2): qty 1

## 2013-12-27 MED ORDER — IPRATROPIUM BROMIDE 0.02 % IN SOLN
RESPIRATORY_TRACT | Status: AC
  Administered 2013-12-27: 0.5 mg via RESPIRATORY_TRACT
  Filled 2013-12-27: qty 2.5

## 2013-12-27 MED ORDER — IPRATROPIUM BROMIDE 0.02 % IN SOLN
0.5000 mg | Freq: Once | RESPIRATORY_TRACT | Status: AC
  Administered 2013-12-27: 0.5 mg via RESPIRATORY_TRACT
  Filled 2013-12-27: qty 2.5

## 2013-12-27 MED ORDER — ALBUTEROL (5 MG/ML) CONTINUOUS INHALATION SOLN
10.0000 mg/h | INHALATION_SOLUTION | Freq: Once | RESPIRATORY_TRACT | Status: DC
  Filled 2013-12-27: qty 20

## 2013-12-27 MED ORDER — SPIRONOLACTONE 25 MG PO TABS
25.0000 mg | ORAL_TABLET | Freq: Every day | ORAL | Status: DC
  Administered 2013-12-27 – 2013-12-29 (×3): 25 mg via ORAL
  Filled 2013-12-27 (×3): qty 1

## 2013-12-27 MED ORDER — LISINOPRIL 40 MG PO TABS
40.0000 mg | ORAL_TABLET | Freq: Every day | ORAL | Status: DC
  Administered 2013-12-27: 40 mg via ORAL
  Filled 2013-12-27 (×2): qty 1

## 2013-12-27 MED ORDER — ZOLPIDEM TARTRATE 5 MG PO TABS
5.0000 mg | ORAL_TABLET | Freq: Every evening | ORAL | Status: DC | PRN
  Administered 2013-12-27 – 2013-12-28 (×2): 5 mg via ORAL
  Filled 2013-12-27 (×3): qty 1

## 2013-12-27 MED ORDER — ALBUTEROL SULFATE (2.5 MG/3ML) 0.083% IN NEBU
INHALATION_SOLUTION | RESPIRATORY_TRACT | Status: AC
  Administered 2013-12-27: 5 mg via RESPIRATORY_TRACT
  Filled 2013-12-27: qty 6

## 2013-12-27 MED ORDER — FUROSEMIDE 10 MG/ML IJ SOLN
40.0000 mg | Freq: Two times a day (BID) | INTRAMUSCULAR | Status: DC
  Filled 2013-12-27 (×3): qty 4

## 2013-12-27 NOTE — ED Notes (Signed)
Pt in c/o cough over the last few days, suddenly worse tonight, brown sputum from cough, pt speaking in short phrases, O2 level 76% on RA upon arrival, recent heart catheterization

## 2013-12-27 NOTE — ED Notes (Signed)
Sarah RT at bedside

## 2013-12-27 NOTE — ED Notes (Signed)
Portable xr at bedside 

## 2013-12-27 NOTE — ED Provider Notes (Signed)
CSN: [id number]     Arrival date & time 12/27/13  0034 History   First MD Initiated Contact with Patient 12/27/13 [phone]     Chief Complaint  Patient presents with  . Cough  . Shortness of Breath     (Consider location/radiation/quality/duration/timing/severity/associated sxs/prior Treatment) Patient is a 38 y.o. male presenting with shortness of breath. The history is provided by the patient.  Shortness of Breath Severity:  Moderate Onset quality:  Sudden Timing:  Constant Progression:  Worsening Chronicity:  New Context: not emotional upset, not fumes and not URI   Relieved by:  Nothing Worsened by:  Nothing tried Associated symptoms: cough   Associated symptoms: no abdominal pain, no chest pain, no fever, no neck pain and no syncope     Past Medical History  Diagnosis Date  . Hypertension    Past Surgical History  Procedure Laterality Date  . Appendectomy    . Abdominal surgery      tumor removed from abd. non milignant   Family History  Problem Relation Age of Onset  . Hypertension Mother   . CAD Mother   . Hypertension Father    History  Substance Use Topics  . Smoking status: Never Smoker   . Smokeless tobacco: Not on file  . Alcohol Use: Yes     Comment: occasionally    Review of Systems  Constitutional: Negative for fever.  Respiratory: Positive for cough and shortness of breath.   Cardiovascular: Negative for chest pain and syncope.  Gastrointestinal: Negative for abdominal pain.  Musculoskeletal: Negative for neck pain.  All other systems reviewed and are negative.     Allergies  Review of patient's allergies indicates no known allergies.  Home Medications   Prior to Admission medications   Medication Sig Start Date End Date Taking? Authorizing Faithe Ariola  aspirin EC 81 MG EC tablet Take 1 tablet (81 mg total) by mouth daily. 07/28/13   Esperanza Sheets, MD  aspirin-acetaminophen-caffeine (EXCEDRIN MIGRAINE) [phone] MG per tablet Take 2  tablets by mouth daily as needed for headache.    Historical Rainen Vanrossum, MD  carvedilol (COREG) 12.5 MG tablet Take 1 tablet (12.5 mg total) by mouth 2 (two) times daily with a meal. 07/28/13   Esperanza Sheets, MD  cephALEXin (KEFLEX) 500 MG capsule Take 1 capsule (500 mg total) by mouth 4 (four) times daily. 09/20/13   Shari A Upstill, PA-C  lisinopril (PRINIVIL,ZESTRIL) 40 MG tablet Take 1 tablet (40 mg total) by mouth daily. 07/28/13   Esperanza Sheets, MD  metFORMIN (GLUCOPHAGE) 500 MG tablet Take 500 mg by mouth daily.    Historical Amya Hlad, MD  pravastatin (PRAVACHOL) 40 MG tablet Take 40 mg by mouth daily.    Historical Tobby Fawcett, MD  spironolactone (ALDACTONE) 25 MG tablet Take 1 tablet (25 mg total) by mouth daily. 08/04/13   Aundria Rud, NP   BP 171/111  Pulse 129  Resp 30  SpO2 76% Physical Exam  Nursing note and vitals reviewed. Constitutional: He is oriented to person, place, and time. He appears well-developed and well-nourished. No distress.  HENT:  Head: Normocephalic and atraumatic.  Mouth/Throat: Oropharynx is clear and moist. No oropharyngeal exudate.  Eyes: EOM are normal. Pupils are equal, round, and reactive to light.  Neck: Normal range of motion. Neck supple.  Cardiovascular: Normal rate and regular rhythm.  Exam reveals no friction rub.   No murmur heard. Pulmonary/Chest: He is in respiratory distress. He has wheezes. He has no rales.  Abdominal: He exhibits no distension. There is no tenderness. There is no rebound.  Musculoskeletal: Normal range of motion. He exhibits no edema.  Neurological: He is alert and oriented to person, place, and time.  Skin: He is not diaphoretic.    ED Course  Procedures (including critical care time) Labs Review Labs Reviewed  CBC  BASIC METABOLIC PANEL  PRO B NATRIURETIC PEPTIDE  D-DIMER, QUANTITATIVE  I-STAT TROPOININ, ED    Imaging Review Dg Chest Port 1 View  12/27/2013   CLINICAL DATA:  Shortness of breath, cough,  and congestion today.  EXAM: PORTABLE CHEST - 1 VIEW  COMPARISON:  07/12/2013  FINDINGS: Shallow inspiration. Borderline heart size with normal pulmonary vascularity. No focal airspace disease or consolidation is suggested. Vague lucencies projected over the right chest probably represents superimposed structures.  IMPRESSION: Shallow inspiration.  No definite evidence of active disease.   Electronically Signed   By: Burman NievesWilliam  Stevens M.D.   On: 12/27/2013 01:37     EKG Interpretation   Date/Time:  Tuesday December 27 2013 00:52:15 EDT Ventricular Rate:  104 PR Interval:  167 QRS Duration: 100 QT Interval:  349 QTC Calculation: 459 R Axis:   38 Text Interpretation:  Sinus tachycardia LVH with secondary repolarization  abnormality Anterior Q waves, possibly due to LVH Baseline wander in  lead(s) V2 Similar to prior Confirmed by Gwendolyn GrantWALDEN  MD, BLAIR ([id number]) on  12/27/2013 12:55:53 AM     CRITICAL CARE Performed by: Dagmar HaitWALDEN, WILLIAM BLAIR   Total critical care time: 30 minutes  Critical care time was exclusive of separately billable procedures and treating other patients.  Critical care was necessary to treat or prevent imminent or life-threatening deterioration.  Critical care was time spent personally by me on the following activities: development of treatment plan with patient and/or surrogate as well as nursing, discussions with consultants, evaluation of patient's response to treatment, examination of patient, obtaining history from patient or surrogate, ordering and performing treatments and interventions, ordering and review of laboratory studies, ordering and review of radiographic studies, pulse oximetry and re-evaluation of patient's condition.   MDM   Final diagnoses:  Hypoxia    85M with hx of HF, HTN presents with acute onset of SOB. Happened while he was swimming in his pool. No smoking hx. No CP with this. Initial O2 sat 76%. Easily corrected on 6 L, HR improved to low 100s  upon my exam. Here no CP, lungs with wheezing. Patient stated he felt wheezing. Still no CP. EKG similar to prior. No peripheral edema. Exam not c/w flash pulmonary edema. Will treat with albuterol, steroids, check labs including D-dimer. Dimer normal, BNP mildly elevated, not on lasix, given lasix. Persistently hypoxic, however able to be weaned down from 15L NRB to 2-6 L [place]. Drops sats with ambulation.  Patient admitted.    Dagmar HaitWilliam Blair Walden, MD 12/27/13 [phone]

## 2013-12-27 NOTE — ED Notes (Addendum)
Pt presents with sudden onset of SOB, pt states he was sitting in his pool when he became SOB and unable to talk in complete sentences. Pt states he has had a cough for several weeks, states most of the time he coughs up a "beige color phlegm" but the cough is more of a dry cough. Pt denies fever, chills, sore throat, chest pain, diaphoresis, or abd pain. Pt speaking complete sentences, pt came to room on NRB at 15%

## 2013-12-27 NOTE — Progress Notes (Signed)
Placed patient on autotitrate mode with nasal mask.  Patient is tolerating well.

## 2013-12-27 NOTE — ED Notes (Signed)
Dr Gwendolyn [name] in at bedside.  O2 was turned down to 2L to see how he does.  Patient immediately desaturated to 89%.  Patient placed back on to 6L at this time.

## 2013-12-27 NOTE — Progress Notes (Signed)
CARDIAC REHAB PHASE I   PRE:  Rate/Rhythm: 91 SR  BP:  Supine:   Sitting: 157/88  Standing:    SaO2: 96% 2L  MODE:  Ambulation: 460 ft   POST:  Rate/Rhythm: 101  BP:  Supine:   Sitting: 140/103  Standing:    SaO2: 89-92%RA 1410-1500 Monitored sats whole walk and not below 89% RA so would not qualify for home oxygen with this walk. Need to monitor sats during night to see if they drop. Pt denied SOB. Walked 460 ft independently and admitted he does not walk as much as he should. Does some swimming for ex but told pt this too exertional upon discharge unless just walking in pool. Does not need exertion of laps at this time. Gave walking guidelines. Pt knows he needs to take meds, follow up for sleep study and adhere to low sodium diet. He can answer all teachback questions with correct answers. He stated he just needed to do it.    Luetta Nutting, RN BSN  12/27/2013 2:52 PM

## 2013-12-27 NOTE — Progress Notes (Signed)
Heart Failure Navigator Consult Note  Presentation: Vernon Reed is a 38 yo male with a history of HTN, HLD, ?OSA, obesity and chronic systolic HF.  He was seen last in the HF clinic in January where at that time he was set up for a pulmonary evaluation and sleep study. He however no showed to those appointments and also did not follow up in the HF clinic.   Presented to the ED with SOB and O2 sats in the 70s. Pro-BNP 777 and was given IV lasix. 24 hr I/O -1/5 liters. Denies SOB, orthopnea or CP. Reports that he has not been taking medications as prescribed and has not been weighing daily.  SH: Works for [employer]ist[employer] service, lives in [place] with roommate. Denies tobacco or drug use. Occasional ETOH.    Past Medical History  Diagnosis Date  . Hypertension     History   Social History  . Marital Status: Single    Spouse Name: N/A    Number of Children: N/A  . Years of Education: N/A   Social History Main Topics  . Smoking status: Never Smoker   . Smokeless tobacco: None  . Alcohol Use: Yes     Comment: occasionally  . Drug Use: No  . Sexual Activity: None   Other Topics Concern  . None   Social History Narrative  . None    ECHO:Study Conclusions--07/23/13  - Left ventricle: The cavity size was normal. Wall thickness was increased in a pattern of mild to moderate LVH. Systolic function was moderately to severely reduced. The estimated ejection fraction was in the range of 30% to 35%. Moderate diffuse hypokinesis. Regional wall motion abnormalities cannot be excluded. Doppler parameters are consistent with abnormal left ventricular relaxation (grade 1 diastolic dysfunction). - Left atrium: The atrium was moderately dilated. Transthoracic echocardiography. M-mode, complete 2D, spectral Doppler, and color Doppler. Height: Height: 182.9cm. Height: 72in. Weight: Weight: 142.7kg. Weight: 314lb. Body mass index: BMI: 42.7kg/m^2. Body surface area: BSA: 2.1923m^2. Blood  pressure: 161/113. Patient status: Inpatient. Location: Bedside.    BNP    Component Value Date/Time   PROBNP 688.2* 12/27/2013 0650    Education Assessment and Provision:  Detailed education and instructions provided on heart failure disease management including the following:  Signs and symptoms of Heart Failure When to call the physician Importance of daily weights Low sodium diet Fluid restriction Medication management Anticipated future follow-up appointments  Patient education given on each of the above topics.  Patient acknowledges understanding and acceptance of all instructions.  [name] has received much of the above education prior to admission and is able to teach back these topics.  He openly admits that he "fell of the wagon" in regards to his HF.  He has not been weighing at home nor watching his sodium intake.  He also has not recently taken his medications.  He does admit that initially he was very strict in regards to his HF recommendations, however as he began to feel better he quit doing the "right thing".  He has been provided his HF medication in the past from the HF clinic due to financial constraints.  Education Materials:  "Living Better With Heart Failure" Booklet, Daily Weight Tracker Tool   High Risk Criteria for Readmission and/or Poor Patient Outcomes:  (Recommend Follow-up with Advanced Heart Failure Clinic)--Yes   EF < or = 30%- Yes-30-35%  2 or more admissions in 6 months- Yes  Difficult social situation- No--lives with roommates  Demonstrates medication  noncompliance- Yes--recently quit taking his medications   Barriers of Care:  Knowledge of HF, compliance and finances  Discharge Planning:   Plans to discharge to home with roommates.  Will need quick outpatient follow-up and reinforcement for compliance.

## 2013-12-27 NOTE — ED Notes (Signed)
Pts O2 sats decreased to 87% at 0245 and 0330 during trials with out O2. Pt also ambulated to bathroom with stand by assistance while checking his O2 sats per VO by Dr. Gwendolyn [name]. Pt's O2 sats decreased to 87-91% while ambulating to the bathroom, sats decreased to 84-87% on the way back to his room. Pt denies any chest pain, nausea, vomiting, or abd pain. EDP Gwendolyn [name] aware

## 2013-12-27 NOTE — Progress Notes (Signed)
Admitted pt from ED assisted by the RN via stretcher. Alert and oriented x 4, denies any pain, not in respiratory distress, denies nausea and vomiting. CMT notified on telemetry box 28. Oriented to room and call bell. Dr. Julian Reil notified of this admission. Will continue to monitor pt

## 2013-12-27 NOTE — Progress Notes (Signed)
Called to patient's bedside by family. Pt c/o feeling "hot flashes and weak." BP 78/40 manually. Paged MD. Orders given to change Coreg, Lasix, and Bidil frequency to BID and decrease Coreg to 12.5mg . Will continue to monitor pt closely and follow MD orders.   Juliane Lack, RN

## 2013-12-27 NOTE — Care Management Note (Signed)
    Page 1 of 1   12/27/2013     3:59:11 PM CARE MANAGEMENT NOTE 12/27/2013  Patient:  Vernon Reed, Vernon Reed   Account Number:  [id number]  Date Initiated:  12/27/2013  Documentation initiated by:  Oletta Cohn  Subjective/Objective Assessment:   38 yo male with a history of HTN, HLD, ?OSA, obesity and chronic systolic HF. //Home with roommates.     Action/Plan:   Lasix 40 mg IV tid today.// Access for [facility] services.   Anticipated DC Date:  12/30/2013   Anticipated DC Plan:  HOME/SELF CARE         Choice offered to / List presented to:             Status of service:  In process, will continue to follow Medicare Important Message given?   (If response is "NO", the following Medicare IM given date fields will be blank) Date Medicare IM given:   Date Additional Medicare IM given:    Discharge Disposition:    Per UR Regulation:  Reviewed for med. necessity/level of care/duration of stay  If discussed at Long Length of Stay Meetings, dates discussed:    Comments:

## 2013-12-27 NOTE — Consult Note (Addendum)
Advanced Heart Failure Team Consult Note  Referring Physician: Dr. Gwenlyn Perking Primary Physician: N/A Primary Cardiologist:  Dr. Eden Emms  Reason for Consultation: A/C Heart Failure  HPI:    [name] is a 38 yo male with a history of HTN, HLD, ?OSA, obesity and chronic systolic HF.   He was seen last in the HF clinic in January where at that time he was set up for a pulmonary evaluation and sleep study. He however no showed to those appointments and also did not follow up in the HF clinic.  ECHO 07/2013: EF 30-35%, grade 1 DD, RV normal  Presented to the ED with SOB and O2 sats in the 70s. Pro-BNP 777 and was given IV lasix. 24 hr I/O -1/5 liters. Denies SOB, orthopnea or CP. Reports that he has not been taking medications as prescribed and has not been weighing daily.   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 Denies tobacco or drug use. Occasional ETOH  FH: Mother- HTN, HLD  Father- HTN   Review of Systems: [y] = yes, [ ]  = no   General: Weight gain [ Y]; Weight loss [ ] ; Anorexia [ ] ; Fatigue [ ] ; Fever [ ] ; Chills [ ] ; Weakness [ ]   Cardiac: Chest pain/pressure Klaus.Mock ]; Resting SOB [ ] ; Exertional SOB [Y ]; Orthopnea [Y ]; Pedal Edema [ ] ; Palpitations [ ] ; Syncope [ ] ; Presyncope [ ] ; Paroxysmal nocturnal dyspnea[ ]   Pulmonary: Cough [ ] ; Wheezing[ ] ; Hemoptysis[ ] ; Sputum [ ] ; Snoring [ ]   GI: Vomiting[ ] ; Dysphagia[ ] ; Melena[ ] ; Hematochezia [ ] ; Heartburn[ ] ; Abdominal pain [ ] ; Constipation [ ] ; Diarrhea [ ] ; BRBPR [ ]   GU: Hematuria[ ] ; Dysuria [ ] ; Nocturia[ ]   Vascular: Pain in legs with walking [ ] ; Pain in feet with lying flat [ ] ; Non-healing sores [ ] ; Stroke [ ] ; TIA [ ] ; Slurred speech [ ] ;  Neuro: Headaches[ ] ; Vertigo[ ] ; Seizures[ ] ; Paresthesias[ ] ;Blurred vision [ ] ; Diplopia [ ] ; Vision changes [ ]   Ortho/Skin: Arthritis [ ] ; Joint pain [ ] ; Muscle pain [ ] ; Joint swelling [ ] ; Back Pain [ ] ; Rash [ ]   Psych: Depression[ N]; Anxiety[ ]    Heme: Bleeding problems [ ] ; Clotting disorders [ ] ; Anemia [ ]   Endocrine: Diabetes [ ] ; Thyroid dysfunction[ ]   Home Medications Prior to Admission medications   Medication Sig Start Date End Date Taking? Authorizing Provider  aspirin EC 81 MG EC tablet Take 1 tablet (81 mg total) by mouth daily. 07/28/13  Yes Esperanza Sheets, MD  carvedilol (COREG) 12.5 MG tablet Take 1 tablet (12.5 mg total) by mouth 2 (two) times daily with a meal. 07/28/13  Yes Esperanza Sheets, MD  isosorbide-hydrALAZINE (BIDIL) 20-37.5 MG per tablet Take 2 tablets by mouth 3 (three) times daily.   Yes Historical Provider, MD  lisinopril (PRINIVIL,ZESTRIL) 40 MG tablet Take 1 tablet (40 mg total) by mouth daily. 07/28/13  Yes Esperanza Sheets, MD  spironolactone (ALDACTONE) 25 MG tablet Take 1 tablet (25 mg total) by mouth daily. 08/04/13  Yes Aundria Rud, NP    Past Medical History: Past Medical History  Diagnosis Date  . Hypertension     Past Surgical History: Past Surgical History  Procedure Laterality Date  . Appendectomy    . Abdominal surgery      tumor removed from abd. non milignant    Family History: Family History  Problem Relation Age of  Onset  . Hypertension Mother   . CAD Mother   . Hypertension Father     Social History: History   Social History  . Marital Status: Single    Spouse Name: N/A    Number of Children: N/A  . Years of Education: N/A   Social History Main Topics  . Smoking status: Never Smoker   . Smokeless tobacco: None  . Alcohol Use: Yes     Comment: occasionally  . Drug Use: No  . Sexual Activity: None   Other Topics Concern  . None   Social History Narrative  . None    Allergies:  No Known Allergies  Objective:    Vital Signs:   Temp:  [98.7 F (37.1 C)-98.8 F (37.1 C)] 98.7 F (37.1 C) (06/23 0829) Pulse Rate:  [84-129] 97 (06/23 0829) Resp:  [10-33] 18 (06/23 0829) BP: (135-171)/(85-111) 155/101 mmHg (06/23 0829) SpO2:  [76 %-99 %] 97 %  (06/23 0829) FiO2 (%):  [0 %] 0 % (06/23 0813) Weight:  [317 lb 10.9 oz (144.1 kg)] 317 lb 10.9 oz (144.1 kg) (06/23 0623) Last BM Date: 12/26/13  Weight change: Filed Weights   12/27/13 0623  Weight: 317 lb 10.9 oz (144.1 kg)    Intake/Output:   Intake/Output Summary (Last 24 hours) at 12/27/13 1130 Last data filed at 12/27/13 1029  Gross per 24 hour  Intake      3 ml  Output   1600 ml  Net  -1597 ml     Physical Exam: General:  Well appearing. No resp difficulty, snoring when walked into room HEENT: normal Neck: supple. JVP difficult to assess d/t body habitus but appears mildly elevated. Carotids 2+ bilat; no bruits. No lymphadenopathy or thryomegaly appreciated. Cor: PMI nondisplaced. Regular rate & rhythm. No rubs, gallops or murmurs. Lungs: clear Abdomen: soft, nontender, nondistended. No hepatosplenomegaly. No bruits or masses. Good bowel sounds. Extremities: no cyanosis, clubbing, rash, edema Neuro: alert & orientedx3, cranial nerves grossly intact. moves all 4 extremities w/o difficulty. Affect pleasant  Telemetry: SR 90s  Labs: Basic Metabolic Panel:  Recent Labs Lab 12/27/13 0049 12/27/13 0858  NA 141  --   K 3.8  --   CL 101  --   CO2 25  --   GLUCOSE 124*  --   BUN 14  --   CREATININE 1.30 1.17  CALCIUM 9.0  --   MG  --  2.0    Liver Function Tests: No results found for this basename: AST, ALT, ALKPHOS, BILITOT, PROT, ALBUMIN,  in the last 168 hours No results found for this basename: LIPASE, AMYLASE,  in the last 168 hours No results found for this basename: AMMONIA,  in the last 168 hours  CBC:  Recent Labs Lab 12/27/13 0049 12/27/13 0858  WBC 10.1 11.3*  HGB 13.3 14.1  HCT 40.6 42.5  MCV 91.2 90.6  PLT 270 271    Cardiac Enzymes: No results found for this basename: CKTOTAL, CKMB, CKMBINDEX, TROPONINI,  in the last 168 hours  BNP: BNP (last 3 results)  Recent Labs  07/22/13 2210 12/27/13 0049 12/27/13 0650  PROBNP 575.6*  777.4* 688.2*    CBG: No results found for this basename: GLUCAP,  in the last 168 hours  Coagulation Studies: No results found for this basename: LABPROT, INR,  in the last 72 hours  Other results: EKG: ST 100s  Imaging: Dg Chest 2 View  12/27/2013   CLINICAL DATA:  Shortness of breath  EXAM:  CHEST  2 VIEW  COMPARISON:  Chest radiograph 12/27/2013; chest CT 07/22/2013.  FINDINGS: Stable cardiac and mediastinal contours. Mild tortuosity of the thoracic aorta. Lungs are clear. No pleural effusion or pneumothorax. Regional skeleton is unremarkable.  IMPRESSION: No acute cardiopulmonary process.   Electronically Signed   By: Annia Belt M.D.   On: 12/27/2013 11:27   Dg Chest Port 1 View  12/27/2013   CLINICAL DATA:  Shortness of breath, cough, and congestion today.  EXAM: PORTABLE CHEST - 1 VIEW  COMPARISON:  07/12/2013  FINDINGS: Shallow inspiration. Borderline heart size with normal pulmonary vascularity. No focal airspace disease or consolidation is suggested. Vague lucencies projected over the right chest probably represents superimposed structures.  IMPRESSION: Shallow inspiration.  No definite evidence of active disease.   Electronically Signed   By: Burman Nieves M.D.   On: 12/27/2013 01:37      Medications:     Current Medications: . aspirin EC  81 mg Oral Daily  . carvedilol  12.5 mg Oral BID WC  . furosemide  40 mg Intravenous Q12H  . heparin  5,000 Units Subcutaneous 3 times per day  . insulin aspart  0-15 Units Subcutaneous TID WC  . lisinopril  40 mg Oral Daily  . sodium chloride  3 mL Intravenous Q12H  . spironolactone  25 mg Oral Daily     Infusions:      Assessment:   1) Acute on chronic systolic HF - EF 81-44% (07/2013) 2) Acute respiratory failure 3) ?OSA/OHS 4) HTN 5) Obesity  Plan/Discussion:    Mr. Rudiger is known to the HF team and was last admitted in January with newly diagnosed HF. He had R/LHC with normal hemodynamics and normal  coronaries. There was concern for OSA and he was set up for sleep study and pulmonary consult however did not follow up.  Presented to the ED yesterday with SOB and marginally elevated pro-BNP. He was given IV lasix with good UOP and now denies SOB. Sats on admission were in the 70s likely related to OHS/OSA. Will order CPAP for here and would recommend getting pulmonary consult. Will need to have sleep study as outpatient. Will need to assess whether he will need home O2.   Volume status appears mildly elevated will continue IV lasix 40 mg IV BID until rise in creatinine. On goal dose ACE-I. SBP elevated 150s will increase coreg to 18.75 mg BID. Will restart Bidil 1 tab TID. Discussed the need to not use Viagra while on Bidil.   Repeat ECHO.  Lengthy discussion with patient about the need to follow up and take medications as prescribed. Will order CR for HF education and ambulation.  Hopefully home in the next day or two.   Length of Stay: 0 Aundria Rud NP-C 12/27/2013, 11:30 AM  Advanced Heart Failure Team Pager [phone] (M-F; 7a - 4p)  Please contact [facility] for night-coverage after hours (4p -7a ) and weekends on [email or web address]  Patient seen with NP, agree with the above note.  He has been taking his meds only sporadically.  He was admitted with severe dyspnea and hypoxemia.  Suspect acute on chronic systolic CHF (hypertensive cardiomyopathy) and possible OHS/OSA.   - Continue Lasix 40 mg IV tid today - Restarted lisinopril and Coreg. - Will use Bidil 1 tab tid (he is not using Viagra and knows not to use it while on Bidil).  - Will reassess oxygen needs prior to discharge, will need sleep study as outpatient.  -  Will get echo  Marca Ancona 12/27/2013 1:16 PM

## 2013-12-27 NOTE — ED Notes (Signed)
MD Walden at bedside 

## 2013-12-27 NOTE — H&P (Signed)
[facility] History and Physical  Vernon LankSteven Marcus WJX:[id number] DOB: [birth year] DOA: 12/27/2013  Referring physician: Dr. Gwendolyn GrantWalden PCP: No PCP Per Patient   Chief Complaint: SOB and hypoxia  HPI: Vernon Reed is a 38 y.o. male with PMH significant for HTN, chronic systolic heart failure (EF 30-35% on 07/2013), DM type 2 and obesity; came to ED complaining of SOB and increase leg swelling. Patient reports not to be compliant with diet or medications. He has not had any follow up with heart failure clinic or PCP since January either. Patient denies CP, nausea, vomiting, abd pain, dysuria, hematemesis, melena, cough, fever or chills. In ED was found with elevated BNP at 777, tachypneic and O2 sat of 76% on RA. CXR w/o infiltrates or aacute cardiopulmonary process.    Review of Systems:  Negative except as mentioned on HPI  Past Medical History  Diagnosis Date  . Hypertension    Past Surgical History  Procedure Laterality Date  . Appendectomy    . Abdominal surgery      tumor removed from abd. non milignant   Social History:  reports that he has never smoked. He does not have any smokeless tobacco history on file. He reports that he drinks alcohol. He reports that he does not use illicit drugs.  No Known Allergies  Family History  Problem Relation Age of Onset  . Hypertension Mother   . CAD Mother   . Hypertension Father      Prior to Admission medications   Medication Sig Start Date End Date Taking? Authorizing Provider  aspirin EC 81 MG EC tablet Take 1 tablet (81 mg total) by mouth daily. 07/28/13  Yes Esperanza SheetsUlugbek N Buriev, MD  carvedilol (COREG) 12.5 MG tablet Take 1 tablet (12.5 mg total) by mouth 2 (two) times daily with a meal. 07/28/13  Yes Esperanza SheetsUlugbek N Buriev, MD  lisinopril (PRINIVIL,ZESTRIL) 40 MG tablet Take 1 tablet (40 mg total) by mouth daily. 07/28/13  Yes Esperanza SheetsUlugbek N Buriev, MD  spironolactone (ALDACTONE) 25 MG tablet Take 1 tablet (25 mg total) by mouth daily.  08/04/13  Yes Aundria RudAli B Cosgrove, NP   Physical Exam: Filed Vitals:   12/27/13 0623  BP: 150/98  Pulse: 98  Temp: 98.8 F (37.1 C)  Resp: 20    BP 150/98  Pulse 98  Temp(Src) 98.8 F (37.1 C) (Oral)  Resp 20  Ht 6' (1.829 m)  Wt 144.1 kg (317 lb 10.9 oz)  BMI 43.08 kg/m2  SpO2 96%  General: Well appearing. No resp difficulty, snoring when walked into room  HEENT: normal  Neck: supple. JVP difficult to assess d/t body habitus but appears mildly elevated. Carotids 2+ bilat; no bruits. No lymphadenopathy or thryomegaly appreciated.  Cor: PMI nondisplaced. Regular rate & rhythm. No rubs, gallops or murmurs. 2++ edema bilaterally Lungs: decreased BS at bases, no frank crackles, no wheezing Abdomen: soft, nontender, nondistended. No hepatosplenomegaly. No bruits or masses. Good bowel sounds.  Extremities: no cyanosis, clubbing, rash, edema  Neuro: alert & orientedx3, cranial nerves grossly intact. moves all 4 extremities w/o difficulty. Affect pleasant           Labs on Admission:  Basic Metabolic Panel:  Recent Labs Lab 12/27/13 0049  NA 141  K 3.8  CL 101  CO2 25  GLUCOSE 124*  BUN 14  CREATININE 1.30  CALCIUM 9.0   CBC:  Recent Labs Lab 12/27/13 0049  WBC 10.1  HGB 13.3  HCT 40.6  MCV 91.2  PLT 270  BNP (last 3 results)  Recent Labs  07/22/13 2210 12/27/13 0049 12/27/13 0650  PROBNP 575.6* 777.4* 688.2*    Radiological Exams on Admission: Dg Chest Port 1 View  12/27/2013   CLINICAL DATA:  Shortness of breath, cough, and congestion today.  EXAM: PORTABLE CHEST - 1 VIEW  COMPARISON:  07/12/2013  FINDINGS: Shallow inspiration. Borderline heart size with normal pulmonary vascularity. No focal airspace disease or consolidation is suggested. Vague lucencies projected over the right chest probably represents superimposed structures.  IMPRESSION: Shallow inspiration.  No definite evidence of active disease.   Electronically Signed   By: Burman Nieves M.D.    On: 12/27/2013 01:37    EKG:  LVH; sinus tachycardia  Assessment/Plan 1-Acute respiratory failure with hypoxia: O2 sat in the 70's on admission. Most likely due to exacerbation of systolic heart failure. Also OSA/OHS contributing to SOB. -admit to telemetry -will check 2-D echo -IV lasix -daily weight, low sodium diet and strict I's and O's -will continue spironolactone and will resume bidil -heart failure service has been consulted -was supposed to be already on lisinopril, aldactone and coreg (will resume) -will check daily BMET  2-Hypertension: patient non-compliant with medications. -low sodium diet -resume coreg, lisinopril and bidil -lasix will also help controlling BP  3-Type II or unspecified type diabetes mellitus without mention of complication, not stated as uncontrolled: was not on any medication at home for diabetes -will check A1C -will use SSI  4-Obesity, morbid: low calorie diet and increase exercise discussed with patient. He is now motivated and looking to change life-style  5-OSA/OHS: advise to lose weight. Will give trial with CPAP while inpatient -needs sleep study at discharge  DVT: SCD's   Cardiology (Heart failure Service)  Code Status: Full Family Communication: no family at bedside Disposition Plan: telemetry, LOS > 2 midnights, inpatient  Time spent: 50 minutes  Vassie Loll [facility] Pager [phone]  **Disclaimer: This note may have been dictated with voice recognition software. Similar sounding words can inadvertently be transcribed and this note may contain transcription errors which may not have been corrected upon publication of note.**

## 2013-12-28 DIAGNOSIS — I517 Cardiomegaly: Secondary | ICD-10-CM

## 2013-12-28 DIAGNOSIS — I5021 Acute systolic (congestive) heart failure: Secondary | ICD-10-CM

## 2013-12-28 DIAGNOSIS — N179 Acute kidney failure, unspecified: Secondary | ICD-10-CM

## 2013-12-28 LAB — GLUCOSE, CAPILLARY
GLUCOSE-CAPILLARY: 123 mg/dL — AB (ref 70–99)
GLUCOSE-CAPILLARY: 115 mg/dL — AB (ref 70–99)
GLUCOSE-CAPILLARY: 164 mg/dL — AB (ref 70–99)
Glucose-Capillary: 88 mg/dL (ref 70–99)

## 2013-12-28 LAB — BASIC METABOLIC PANEL
BUN: 20 mg/dL (ref 6–23)
CALCIUM: 8.5 mg/dL (ref 8.4–10.5)
CO2: 30 meq/L (ref 19–32)
CREATININE: 1.63 mg/dL — AB (ref 0.50–1.35)
Chloride: 98 mEq/L (ref 96–112)
GFR calc Af Amer: 60 mL/min — ABNORMAL LOW (ref >90)
GFR calc non Af Amer: 52 mL/min — ABNORMAL LOW (ref >90)
GLUCOSE: 114 mg/dL — AB (ref 70–99)
Potassium: 3.5 mEq/L — ABNORMAL LOW (ref 3.7–5.3)
Sodium: 139 mEq/L (ref 137–147)

## 2013-12-28 MED ORDER — PERFLUTREN LIPID MICROSPHERE
INTRAVENOUS | Status: AC
  Filled 2013-12-28: qty 10

## 2013-12-28 MED ORDER — LISINOPRIL 20 MG PO TABS
20.0000 mg | ORAL_TABLET | Freq: Every day | ORAL | Status: DC
  Administered 2013-12-28 – 2013-12-29 (×2): 20 mg via ORAL
  Filled 2013-12-28 (×2): qty 1

## 2013-12-28 MED ORDER — METFORMIN HCL 500 MG PO TABS: 500.0000 mg | ORAL_TABLET | Freq: Two times a day (BID) | ORAL | Status: DC

## 2013-12-28 MED ORDER — FUROSEMIDE 40 MG PO TABS
40.0000 mg | ORAL_TABLET | Freq: Two times a day (BID) | ORAL | Status: DC
  Administered 2013-12-28 – 2013-12-29 (×3): 40 mg via ORAL
  Filled 2013-12-28 (×5): qty 1

## 2013-12-28 MED ORDER — ISOSORB DINITRATE-HYDRALAZINE 20-37.5 MG PO TABS
1.0000 | ORAL_TABLET | Freq: Three times a day (TID) | ORAL | Status: DC
  Administered 2013-12-28 – 2013-12-29 (×4): 1 via ORAL
  Filled 2013-12-28 (×7): qty 1

## 2013-12-28 NOTE — Progress Notes (Signed)
CARDIAC REHAB PHASE I   PRE:  Rate/Rhythm: 89 SR  BP:  Supine:   Sitting: 140/97  Standing:    SaO2: 94 RA  MODE:  Ambulation: 760 ft   POST:  Rate/Rhythm: 97 SR  BP:  Supine:   Sitting: 141/90  Standing:    SaO2: 90-94 RA 0945-1045 Pt tolerated ambulation well without c/o of pain or SOB. He states that he is feeling much better and that his SOB is much improved. RA sats during and after walk 90-94%. Pt back to recliner after walk with call light in reach. Completed education with pt. We discussed his A1C and low carb diet. I also discussed with his medication and MD appointment compliance. I discussed Outpt. CRP with pt. He agrees to referral to [facility]. He has no insurance, I gave him financial from to fill out. Pt is starting to realize that he needs to make a lot of lifestyle changes. I gave him a lot of encouragement.   Melina Copa RN 12/28/2013 10:45 AM

## 2013-12-28 NOTE — Progress Notes (Signed)
Echocardiogram 2D Echocardiogram has been performed.  BROWN, CALEBA 12/28/2013, 2:54 PM

## 2013-12-28 NOTE — Progress Notes (Signed)
PHARMACIST - PHYSICIAN COMMUNICATION DR:  David Stall CONCERNING:  METFORMIN SAFE ADMINISTRATION POLICY  RECOMMENDATION: Metformin has been placed on DISCONTINUE (rejected order) STATUS and should be reordered only after any of the conditions below are ruled out.  Current safety recommendations include avoiding metformin for a minimum of 48 hours after the patient's exposure to intravenous contrast media.  DESCRIPTION:  The Pharmacy Committee has adopted a policy that restricts the use of metformin in hospitalized patients until all the contraindications to administration have been ruled out. Specific contraindications are: [x]  Serum creatinine ? 1.5 for males []  Serum creatinine ? 1.4 for females []  Shock, acute MI, sepsis, hypoxemia, dehydration []  Planned administration of intravenous iodinated contrast media []  Heart Failure patients with low EF []  Acute or chronic metabolic acidosis (including DKA)     Georgina Pillion, PharmD, BCPS Clinical Pharmacist Pager: [phone] 12/28/2013 11:04 AM

## 2013-12-28 NOTE — Progress Notes (Signed)
[facility] PROGRESS NOTE  Filed Weights   12/27/13 0623 12/28/13 0521  Weight: 144.1 kg (317 lb 10.9 oz) 141.522 kg (312 lb)        Intake/Output Summary (Last 24 hours) at 12/28/13 1057 Last data filed at 12/28/13 0831  Gross per 24 hour  Intake   1320 ml  Output   2330 ml  Net  -1010 ml     Assessment/Plan: Acute respiratory failure with hypoxia/ Acute on chronic systolic heart failure - Nonischemic cardiomyopathy. Patient had been sporadic with his medications and has also missed cardiology followup. -  volume status improved. Creatinine up to 1.6.  - Start Lasix changed to 40 oral. - Coreg but at 12.5 mg bid, decrease lisinopril to 20 daily, stop amlodipine, and continue Bidil 1 tab tid and spironolactone.   HTN:  - Had been uncontrolled due to noncompliance.  - Will watch in the hospital today to make sure that we are not overtreating his blood pressure.   OSA:  - Continue CPAP use.    AKI: -  Mild rise in creatinine, see above.  - baseline cr ~ 1.1.  Type II or unspecified type diabetes mellitus without mention of complication, not stated as uncontrolled: - start metformin.   Code Status: full Family Communication: none  Disposition Plan: inaptient   Consultants:  cardiology  Procedures: ECHO: none  Antibiotics:  None  HPI/Subjective: Feels better, no complains   Objective: Filed Vitals:   12/27/13 1759 12/27/13 1831 12/27/13 2011 12/28/13 0521  BP: 78/40 90/48 120/55 122/79  Pulse:   88 76  Temp:   97.9 F (36.6 C) 97.7 F (36.5 C)  TempSrc:   Oral Oral  Resp:   18 18  Height:      Weight:    141.522 kg (312 lb)  SpO2: 93%  93% 95%     Exam:  General: Alert, awake, oriented x3, in no acute distress.  HEENT: No bruits, no goiter. Cannot appriciate Heart: Regular rate and rhythm. Lungs: Good air movement, clear Abdomen: Soft, nontender, nondistended, positive bowel sounds.     Data Reviewed: Basic Metabolic  Panel:  Recent Labs Lab 12/27/13 0049 12/27/13 0858 12/28/13 0355  NA 141  --  139  K 3.8  --  3.5*  CL 101  --  98  CO2 25  --  30  GLUCOSE 124*  --  114*  BUN 14  --  20  CREATININE 1.30 1.17 1.63*  CALCIUM 9.0  --  8.5  MG  --  2.0  --    Liver Function Tests: No results found for this basename: AST, ALT, ALKPHOS, BILITOT, PROT, ALBUMIN,  in the last 168 hours No results found for this basename: LIPASE, AMYLASE,  in the last 168 hours No results found for this basename: AMMONIA,  in the last 168 hours CBC:  Recent Labs Lab 12/27/13 0049 12/27/13 0858  WBC 10.1 11.3*  HGB 13.3 14.1  HCT 40.6 42.5  MCV 91.2 90.6  PLT 270 271   Cardiac Enzymes: No results found for this basename: CKTOTAL, CKMB, CKMBINDEX, TROPONINI,  in the last 168 hours BNP (last 3 results)  Recent Labs  07/22/13 2210 12/27/13 0049 12/27/13 0650  PROBNP 575.6* 777.4* 688.2*   CBG:  Recent Labs Lab 12/27/13 1153 12/27/13 1709 12/27/13 2116 12/28/13 0556  GLUCAP 187* 123* 96 123*    No results found for this or any previous visit (from the past 240 hour(s)).   Studies:  Dg Chest 2 View  12/27/2013   CLINICAL DATA:  Shortness of breath  EXAM: CHEST  2 VIEW  COMPARISON:  Chest radiograph 12/27/2013; chest CT 07/22/2013.  FINDINGS: Stable cardiac and mediastinal contours. Mild tortuosity of the thoracic aorta. Lungs are clear. No pleural effusion or pneumothorax. Regional skeleton is unremarkable.  IMPRESSION: No acute cardiopulmonary process.   Electronically Signed   By: Annia Beltrew  Davis M.D.   On: 12/27/2013 11:27   Dg Chest Port 1 View  12/27/2013   CLINICAL DATA:  Shortness of breath, cough, and congestion today.  EXAM: PORTABLE CHEST - 1 VIEW  COMPARISON:  07/12/2013  FINDINGS: Shallow inspiration. Borderline heart size with normal pulmonary vascularity. No focal airspace disease or consolidation is suggested. Vague lucencies projected over the right chest probably represents superimposed  structures.  IMPRESSION: Shallow inspiration.  No definite evidence of active disease.   Electronically Signed   By: Burman NievesWilliam  Stevens M.D.   On: 12/27/2013 01:37    Scheduled Meds: . aspirin EC  81 mg Oral Daily  . carvedilol  12.5 mg Oral BID WC  . furosemide  40 mg Oral BID  . insulin aspart  0-15 Units Subcutaneous TID WC  . isosorbide-hydrALAZINE  1 tablet Oral TID  . lisinopril  20 mg Oral Daily  . sodium chloride  3 mL Intravenous Q12H  . spironolactone  25 mg Oral Daily   Continuous Infusions:    Marinda ElkFELIZ ORTIZ, ABRAHAM  [facility] Pager [phone] 8PM-8AM, please contact night-coverage at [email or web address], password [email or web address] 12/28/2013, 10:57 AM  LOS: 1 day     **Disclaimer: This note may have been dictated with voice recognition software. Similar sounding words can inadvertently be transcribed and this note may contain transcription errors which may not have been corrected upon publication of note.**

## 2013-12-28 NOTE — Progress Notes (Addendum)
Patient ID: Vernon Reed, male   DOB: [birth year], 38 y.o.   MRN: [id number]   SUBJECTIVE: Vernon Reed is a 38 yo male with a history of HTN, HLD, ?OSA, obesity and chronic systolic HF (nonischemic cardiomyopathy).  He was seen last in the HF clinic in January where at that time he was set up for a pulmonary evaluation and sleep study. He however no showed to those appointments and also did not follow up in the HF clinic.   ECHO 07/2013: EF 30-35%, grade 1 DD, RV normal   Presented to the ED this admission with SOB and O2 sats in the 70s. Pro-BNP 777 and was started on IV Lasix.  Yesterday, he diuresed well with net negative -2397 and 5 lbs down. Creatinine up to 1.6.  He was restarted on BP meds yesterday.  BP got low last night, 78/40 but quickly rebounded.    Filed Vitals:   12/27/13 1759 12/27/13 1831 12/27/13 2011 12/28/13 0521  BP: 78/40 90/48 120/55 122/79  Pulse:   88 76  Temp:   97.9 F (36.6 C) 97.7 F (36.5 C)  TempSrc:   Oral Oral  Resp:   18 18  Height:      Weight:    312 lb (141.522 kg)  SpO2: 93%  93% 95%    Intake/Output Summary (Last 24 hours) at 12/28/13 0804 Last data filed at 12/28/13 0700  Gross per 24 hour  Intake   1083 ml  Output   2330 ml  Net  -1247 ml   Scheduled Meds: . aspirin EC  81 mg Oral Daily  . carvedilol  12.5 mg Oral BID WC  . furosemide  40 mg Oral BID  . insulin aspart  0-15 Units Subcutaneous TID WC  . isosorbide-hydrALAZINE  1 tablet Oral TID  . lisinopril  20 mg Oral Daily  . sodium chloride  3 mL Intravenous Q12H  . spironolactone  25 mg Oral Daily   Continuous Infusions:  PRN Meds:.sodium chloride, hydrALAZINE, sodium chloride, zolpidem   LABS: Basic Metabolic Panel:  Recent Labs  04/22/50 0049 12/27/13 0858 12/28/13 0355  NA 141  --  139  K 3.8  --  3.5*  CL 101  --  98  CO2 25  --  30  GLUCOSE 124*  --  114*  BUN 14  --  20  CREATININE 1.30 1.17 1.63*  CALCIUM 9.0  --  8.5  MG  --  2.0  --    Liver Function  Tests: No results found for this basename: AST, ALT, ALKPHOS, BILITOT, PROT, ALBUMIN,  in the last 72 hours No results found for this basename: LIPASE, AMYLASE,  in the last 72 hours CBC:  Recent Labs  12/27/13 0049 12/27/13 0858  WBC 10.1 11.3*  HGB 13.3 14.1  HCT 40.6 42.5  MCV 91.2 90.6  PLT 270 271   Cardiac Enzymes: No results found for this basename: CKTOTAL, CKMB, CKMBINDEX, TROPONINI,  in the last 72 hours BNP: No components found with this basename: POCBNP,  D-Dimer:  Recent Labs  12/27/13 0049  DDIMER 0.45   Hemoglobin A1C:  Recent Labs  12/27/13 0858  HGBA1C 6.7*   Fasting Lipid Panel: No results found for this basename: CHOL, HDL, LDLCALC, TRIG, CHOLHDL, LDLDIRECT,  in the last 72 hours Thyroid Function Tests:  Recent Labs  12/27/13 0858  TSH 0.918   Anemia Panel: No results found for this basename: VITAMINB12, FOLATE, FERRITIN, TIBC, IRON, RETICCTPCT,  in the last  72 hours  RADIOLOGY: Dg Chest 2 View  12/27/2013   CLINICAL DATA:  Shortness of breath  EXAM: CHEST  2 VIEW  COMPARISON:  Chest radiograph 12/27/2013; chest CT 07/22/2013.  FINDINGS: Stable cardiac and mediastinal contours. Mild tortuosity of the thoracic aorta. Lungs are clear. No pleural effusion or pneumothorax. Regional skeleton is unremarkable.  IMPRESSION: No acute cardiopulmonary process.   Electronically Signed   By: Annia Beltrew  Davis M.D.   On: 12/27/2013 11:27   Dg Chest Port 1 View  12/27/2013   CLINICAL DATA:  Shortness of breath, cough, and congestion today.  EXAM: PORTABLE CHEST - 1 VIEW  COMPARISON:  07/12/2013  FINDINGS: Shallow inspiration. Borderline heart size with normal pulmonary vascularity. No focal airspace disease or consolidation is suggested. Vague lucencies projected over the right chest probably represents superimposed structures.  IMPRESSION: Shallow inspiration.  No definite evidence of active disease.   Electronically Signed   By: Burman NievesWilliam  Stevens M.D.   On: 12/27/2013  01:37    PHYSICAL EXAM General: NAD Neck: JVP 8 cm, no thyromegaly or thyroid nodule.  Lungs: Clear to auscultation bilaterally with normal respiratory effort. CV: Nondisplaced PMI.  Heart regular S1/S2, no S3/S4, no murmur.  No peripheral edema.  No carotid bruit.  Normal pedal pulses.  Abdomen: Soft, nontender, no hepatosplenomegaly, no distention.  Neurologic: Alert and oriented x 3.  Psych: Normal affect. Extremities: No clubbing or cyanosis.   TELEMETRY: Reviewed telemetry pt in NSR  Assessment/Plan: 38 yo with OSA, HTN, and nonischemic cardiomyopathy presented with acute on chronic systolic CHF. 1. Acute on chronic systolic CHF: Nonischemic cardiomyopathy.  Patient had been sporadic with his medications and has also missed cardiology followup.  BP high at admission.  Suspect hypertensive cardiomyopathy.  He has diuresed well, volume status improved.  Creatinine up to 1.6. - Stop IV Lasix, start Lasix 40 mg po bid. - Low BP last night: Will continue Coreg but at 12.5 mg bid, decrease lisinopril to 20 daily, stop amlodipine, and continue Bidil 1 tab tid and spironolactone.  2. HTN: Had been uncontrolled (not taking meds regularly at home).  Probably overshot yesterday.  Cutting back on meds as above.  Will watch in the hospital today to make sure that we are not overtreating his blood pressure.  3. OSA: Continue CPAP use.   4. AKI: Mild rise in creatinine, cutting back lisinopril and changing Lasix to po.  We have discussed compliance with meds and followup.   Marca AnconaDalton McLean 12/28/2013 8:11 AM

## 2013-12-29 ENCOUNTER — Other Ambulatory Visit (HOSPITAL_COMMUNITY): Payer: Self-pay | Admitting: Cardiology

## 2013-12-29 DIAGNOSIS — E119 Type 2 diabetes mellitus without complications: Secondary | ICD-10-CM

## 2013-12-29 LAB — GLUCOSE, CAPILLARY
GLUCOSE-CAPILLARY: 105 mg/dL — AB (ref 70–99)
Glucose-Capillary: 90 mg/dL (ref 70–99)

## 2013-12-29 LAB — BASIC METABOLIC PANEL
BUN: 24 mg/dL — AB (ref 6–23)
CALCIUM: 8.5 mg/dL (ref 8.4–10.5)
CO2: 30 mEq/L (ref 19–32)
CREATININE: 1.47 mg/dL — AB (ref 0.50–1.35)
Chloride: 101 mEq/L (ref 96–112)
GFR, EST AFRICAN AMERICAN: 68 mL/min — AB (ref >90)
GFR, EST NON AFRICAN AMERICAN: 58 mL/min — AB (ref >90)
GLUCOSE: 94 mg/dL (ref 70–99)
POTASSIUM: 4 meq/L (ref 3.7–5.3)
Sodium: 141 mEq/L (ref 137–147)

## 2013-12-29 MED ORDER — CARVEDILOL 12.5 MG PO TABS: 12.5000 mg | ORAL_TABLET | Freq: Two times a day (BID) | ORAL | Status: DC

## 2013-12-29 MED ORDER — FUROSEMIDE 40 MG PO TABS: 40.0000 mg | ORAL_TABLET | Freq: Every day | ORAL | Status: DC

## 2013-12-29 MED ORDER — LISINOPRIL 20 MG PO TABS: 20.0000 mg | ORAL_TABLET | Freq: Every day | ORAL | Status: DC

## 2013-12-29 MED ORDER — ISOSORB DINITRATE-HYDRALAZINE 20-37.5 MG PO TABS: 1.0000 | ORAL_TABLET | Freq: Three times a day (TID) | ORAL | Status: DC

## 2013-12-29 MED ORDER — METFORMIN HCL 500 MG PO TABS: 500.0000 mg | ORAL_TABLET | Freq: Two times a day (BID) | ORAL | Status: DC

## 2013-12-29 MED ORDER — METFORMIN HCL 500 MG PO TABS
500.0000 mg | ORAL_TABLET | Freq: Two times a day (BID) | ORAL | Status: DC
Start: 2013-12-29 — End: 2013-12-29

## 2013-12-29 NOTE — Addendum Note (Signed)
Addended by: Theresia Bough on: 12/29/2013 04:50 PM   Modules accepted: Orders

## 2013-12-29 NOTE — Progress Notes (Signed)
Patient ID: Vernon Reed, male   DOB: December 13, 1975, 38 y.o.   MRN: [id number]   SUBJECTIVE: [name] is a 38 yo male with a history of HTN, HLD, ?OSA, obesity and chronic systolic HF (nonischemic cardiomyopathy).  He was seen last in the HF clinic in January where at that time he was set up for a pulmonary evaluation and sleep study. He however no showed to those appointments and also did not follow up in the HF clinic.   ECHO 07/2013: EF 30-35%, grade 1 DD, RV normal  ECHO 12/28/2013: EF 45-50%, grade 1 DD.   Presented to the ED this admission with SOB and O2 sats in the 70s. Pro-BNP 777 and was started on IV Lasix. He has had good UOP with diuresis  but with re initiation of BP meds BP dropped into the 70s. Cr also up but trending down. Yesterday changed to PO lasix, decreased lisinopril and stopped norvasc. SBP 110-115.  -795 cc 24 hr.    Filed Vitals:   12/28/13 1614 12/28/13 2152 12/28/13 2300 12/29/13 0450  BP: 115/68 116/71  111/67  Pulse: 80 81 80 79  Temp: 97.9 F (36.6 C) 98.3 F (36.8 C)  97.9 F (36.6 C)  TempSrc: Oral Oral  Oral  Resp: 18 18 18 18   Height:      Weight:    310 lb 8 oz (140.842 kg)  SpO2: 94% 98% 98% 99%    Intake/Output Summary (Last 24 hours) at 12/29/13 0736 Last data filed at 12/29/13 0452  Gross per 24 hour  Intake    780 ml  Output   1575 ml  Net   -795 ml   Scheduled Meds: . aspirin EC  81 mg Oral Daily  . carvedilol  12.5 mg Oral BID WC  . furosemide  40 mg Oral BID  . insulin aspart  0-15 Units Subcutaneous TID WC  . isosorbide-hydrALAZINE  1 tablet Oral TID  . lisinopril  20 mg Oral Daily  . sodium chloride  3 mL Intravenous Q12H  . spironolactone  25 mg Oral Daily   Continuous Infusions:  PRN Meds:.sodium chloride, hydrALAZINE, sodium chloride, zolpidem   LABS: Basic Metabolic Panel:  Recent Labs  57/84/6906/23/15 0858 12/28/13 0355 12/29/13 0306  NA  --  139 141  K  --  3.5* 4.0  CL  --  98 101  CO2  --  30 30  GLUCOSE  --   114* 94  BUN  --  20 24*  CREATININE 1.17 1.63* 1.47*  CALCIUM  --  8.5 8.5  MG 2.0  --   --    Liver Function Tests: No results found for this basename: AST, ALT, ALKPHOS, BILITOT, PROT, ALBUMIN,  in the last 72 hours No results found for this basename: LIPASE, AMYLASE,  in the last 72 hours CBC:  Recent Labs  12/27/13 0049 12/27/13 0858  WBC 10.1 11.3*  HGB 13.3 14.1  HCT 40.6 42.5  MCV 91.2 90.6  PLT 270 271   Cardiac Enzymes: No results found for this basename: CKTOTAL, CKMB, CKMBINDEX, TROPONINI,  in the last 72 hours BNP: No components found with this basename: POCBNP,  D-Dimer:  Recent Labs  12/27/13 0049  DDIMER 0.45   Hemoglobin A1C:  Recent Labs  12/27/13 0858  HGBA1C 6.7*   Fasting Lipid Panel: No results found for this basename: CHOL, HDL, LDLCALC, TRIG, CHOLHDL, LDLDIRECT,  in the last 72 hours Thyroid Function Tests:  Recent Labs  12/27/13 [id number]  TSH 0.918   Anemia Panel: No results found for this basename: VITAMINB12, FOLATE, FERRITIN, TIBC, IRON, RETICCTPCT,  in the last 72 hours  RADIOLOGY: Dg Chest 2 View  12/27/2013   CLINICAL DATA:  Shortness of breath  EXAM: CHEST  2 VIEW  COMPARISON:  Chest radiograph 12/27/2013; chest CT 07/22/2013.  FINDINGS: Stable cardiac and mediastinal contours. Mild tortuosity of the thoracic aorta. Lungs are clear. No pleural effusion or pneumothorax. Regional skeleton is unremarkable.  IMPRESSION: No acute cardiopulmonary process.   Electronically Signed   By: Annia Belt M.D.   On: 12/27/2013 11:27   Dg Chest Port 1 View  12/27/2013   CLINICAL DATA:  Shortness of breath, cough, and congestion today.  EXAM: PORTABLE CHEST - 1 VIEW  COMPARISON:  07/12/2013  FINDINGS: Shallow inspiration. Borderline heart size with normal pulmonary vascularity. No focal airspace disease or consolidation is suggested. Vague lucencies projected over the right chest probably represents superimposed structures.  IMPRESSION: Shallow  inspiration.  No definite evidence of active disease.   Electronically Signed   By: Burman Nieves M.D.   On: 12/27/2013 01:37    PHYSICAL EXAM General: NAD, lying flat in bed with CPAP Neck: JVP thick but appears flat, no thyromegaly or thyroid nodule.  Lungs: Clear to auscultation bilaterally with normal respiratory effort. CV: Nondisplaced PMI.  Heart regular S1/S2, no S3/S4, no murmur.  No peripheral edema.  No carotid bruit.  Normal pedal pulses.  Abdomen: Soft, nontender, no hepatosplenomegaly, no distention.  Neurologic: Alert and oriented x 3.  Psych: Normal affect. Extremities: No clubbing or cyanosis.   TELEMETRY: Reviewed telemetry pt in NSR  Assessment/Plan: 38 yo with OSA, HTN, and nonischemic cardiomyopathy presented with acute on chronic systolic CHF. 1. Acute on chronic systolic CHF: Nonischemic cardiomyopathy.  Patient had been sporadic with his medications and has also missed cardiology followup.  BP high at admission.  Suspect hypertensive cardiomyopathy.  He has diuresed well, volume status improved.  Creatinine increased to 1.6 but now trending down. - Repeat ECHO showed EF improving and now 45-50% with grade 1 DD. - Would hold lasix for today and then starting tomorrow take 40 mg daily.  - Will continue Coreg but at 12.5 mg bid,  lisinopril  20 mg daily, Bidil 1 tab tid and spironolactone. Do not restart amlodipine. Discussed with patient the need to not take Viagra since he is taking Bidil and he reports he understands. 2. HTN: Had been uncontrolled (not taking meds regularly at home). More controlled. As above.  3. OSA: Continue CPAP use.  Will need pulmonary evaluation on OP side and will need sleep study.  4. AKI: Mild rise in creatinine. Trending down will hold lasix today.   Can go home today with close follow up in the HF clinic next week with BMET Meds: Coreg 12.5 mg BID Bidil 1 tab TID Spiro 25 mg daily Lisinopril 20 mg dialy Lasix 40 mg  daily  Aundria Rud NP-C 12/29/2013 7:36 AM  Patient seen with NP, agree with the above note.  BP stable, may be discharged today.  Volume looks good.  Continue medications as above.  We will see him next week in CHF clinic with BMET.  He will need a sleep study.  Overall, needs good BP control and control of sleep apnea.  With these steps, think he will do well.  Marca Ancona 12/29/2013 8:04 AM

## 2013-12-29 NOTE — Discharge Summary (Signed)
Physician Discharge Summary  Vernon Reed YNW:[id number] DOB: Mar 26, 1976 DOA: 12/27/2013  PCP: No PCP Per Patient  Admit date: 12/27/2013 Discharge date: 12/29/2013  Time spent: 35 minutes  Recommendations for Outpatient Follow-up:  1. Follow up with Heart failure clinic in 1 week. Titrate HF medication and monitor electrolyes 2. Pulmonary for sleep study.  BNP    Component Value Date/Time   PROBNP 688.2* 12/27/2013 0650   Filed Weights   12/27/13 0623 12/28/13 0521 12/29/13 0450  Weight: 144.1 kg (317 lb 10.9 oz) 141.522 kg (312 lb) 140.842 kg (310 lb 8 oz)     Discharge Diagnoses:  Principal Problem:   Acute respiratory failure with hypoxia Active Problems:   Hypertension   Type II or unspecified type diabetes mellitus without mention of complication, not stated as uncontrolled   Acute on chronic systolic heart failure   Obesity, morbid   Discharge Condition: stable  Diet recommendation: low sodium    History of present illness:  38 y.o. male with PMH significant for HTN, chronic systolic heart failure (EF 30-35% on 07/2013), DM type 2 and obesity; came to ED complaining of SOB and increase leg swelling. Patient reports not to be compliant with diet or medications. He has not had any follow up with heart failure clinic or PCP since January either. Patient denies CP, nausea, vomiting, abd pain, dysuria, hematemesis, melena, cough, fever or chills. In ED was found with elevated BNP at 777, tachypneic and O2 sat of 76% on RA. CXR w/o infiltrates or aacute cardiopulmonary process.   Hospital Course:  Acute respiratory failure with hypoxia/ Acute on chronic systolic heart failure  - Nonischemic cardiomyopathy. Patient had been sporadic with his medications and has also missed cardiology followup.  - diuresed about 3.5L - Lasix changed to 40 oral.  - Coreg 12.5 mg bid,  lisinopril to 20 daily,  and continue Bidil 1 tab tid and spironolactone.  - follow up with HF  clinic.  HTN:  - Had been uncontrolled due to noncompliance.  - significant improvement with current treatment.  OSA:  - Continue CPAP use.  - follow up with pulmonary for sleep study.  AKI:  - Mild rise in creatinine, with a lower dose of diuretics Cr improved. - baseline cr ~ 1.1.   Type II or unspecified type diabetes mellitus without mention of complication, not stated as uncontrolled:  - start metformin.   Procedures:  CXR  Consultations:  Cardio HF team  Discharge Exam: Filed Vitals:   12/29/13 0450  BP: 111/67  Pulse: 79  Temp: 97.9 F (36.6 C)  Resp: 18    General: A&O x3 Cardiovascular: RRR Respiratory: good air movement CTA B/L  Discharge Instructions      Discharge Instructions   Amb Referral to Cardiac Rehabilitation    Complete by:  As directed      Diet - low sodium heart healthy    Complete by:  As directed      Increase activity slowly    Complete by:  As directed             Medication List         aspirin 81 MG EC tablet  Take 1 tablet (81 mg total) by mouth daily.     carvedilol 12.5 MG tablet  Commonly known as:  COREG  Take 1 tablet (12.5 mg total) by mouth 2 (two) times daily with a meal.     furosemide 40 MG tablet  Commonly known as:  LASIX  Take 1 tablet (40 mg total) by mouth daily.     isosorbide-hydrALAZINE 20-37.5 MG per tablet  Commonly known as:  BIDIL  Take 1 tablet by mouth 3 (three) times daily.     lisinopril 20 MG tablet  Commonly known as:  PRINIVIL,ZESTRIL  Take 1 tablet (20 mg total) by mouth daily.     metFORMIN 500 MG tablet  Commonly known as:  GLUCOPHAGE  Take 1 tablet (500 mg total) by mouth 2 (two) times daily with a meal.     spironolactone 25 MG tablet  Commonly known as:  ALDACTONE  Take 1 tablet (25 mg total) by mouth daily.       No Known Allergies Follow-up Information   Follow up with [facility] In 3 weeks. (sleep study)    Specialty:  Pulmonology   Contact  information:   [address] [place] [address] [phone]      Follow up with [facility] On 01/04/2014. (@ 3:15 pm. Gate Code 4000; Please bring all your medications to your visit. )    Specialty:  Cardiology   Contact information:   [address] [id number] [place] [address] [phone]       The results of significant diagnostics from this hospitalization (including imaging, microbiology, ancillary and laboratory) are listed below for reference.    Significant Diagnostic Studies: Dg Chest 2 View  12/27/2013   CLINICAL DATA:  Shortness of breath  EXAM: CHEST  2 VIEW  COMPARISON:  Chest radiograph 12/27/2013; chest CT 07/22/2013.  FINDINGS: Stable cardiac and mediastinal contours. Mild tortuosity of the thoracic aorta. Lungs are clear. No pleural effusion or pneumothorax. Regional skeleton is unremarkable.  IMPRESSION: No acute cardiopulmonary process.   Electronically Signed   By: Annia Belt M.D.   On: 12/27/2013 11:27   Dg Chest Port 1 View  12/27/2013   CLINICAL DATA:  Shortness of breath, cough, and congestion today.  EXAM: PORTABLE CHEST - 1 VIEW  COMPARISON:  07/12/2013  FINDINGS: Shallow inspiration. Borderline heart size with normal pulmonary vascularity. No focal airspace disease or consolidation is suggested. Vague lucencies projected over the right chest probably represents superimposed structures.  IMPRESSION: Shallow inspiration.  No definite evidence of active disease.   Electronically Signed   By: Burman Nieves M.D.   On: 12/27/2013 01:37    Microbiology: No results found for this or any previous visit (from the past 240 hour(s)).   Labs: Basic Metabolic Panel:  Recent Labs Lab 12/27/13 0049 12/27/13 0858 12/28/13 0355 12/29/13 0306  NA 141  --  139 141  K 3.8  --  3.5* 4.0  CL 101  --  98 101  CO2 25  --  30 30  GLUCOSE 124*  --  114* 94  BUN 14  --  20 24*  CREATININE 1.30 1.17 1.63*  1.47*  CALCIUM 9.0  --  8.5 8.5  MG  --  2.0  --   --    Liver Function Tests: No results found for this basename: AST, ALT, ALKPHOS, BILITOT, PROT, ALBUMIN,  in the last 168 hours No results found for this basename: LIPASE, AMYLASE,  in the last 168 hours No results found for this basename: AMMONIA,  in the last 168 hours CBC:  Recent Labs Lab 12/27/13 0049 12/27/13 0858  WBC 10.1 11.3*  HGB 13.3 14.1  HCT 40.6 42.5  MCV 91.2 90.6  PLT 270 271  Cardiac Enzymes: No results found for this basename: CKTOTAL, CKMB, CKMBINDEX, TROPONINI,  in the last 168 hours BNP: BNP (last 3 results)  Recent Labs  07/22/13 2210 12/27/13 0049 12/27/13 0650  PROBNP 575.6* 777.4* 688.2*   CBG:  Recent Labs Lab 12/28/13 0556 12/28/13 1157 12/28/13 1617 12/28/13 2150 12/29/13 0606  GLUCAP 123* 115* 88 164* 105*       Signed:  Marinda ElkFELIZ ORTIZ, ABRAHAM  [facility] 12/29/2013, 11:08 AM   **Disclaimer: This note may have been dictated with voice recognition software. Similar sounding words can inadvertently be transcribed and this note may contain transcription errors which may not have been corrected upon publication of note.**

## 2013-12-29 NOTE — Progress Notes (Signed)
All d/c instructions explained and given to pt.  Verbalized understanding.  D/c off floor via ambulatory to awaiting transport.  At 1220.  Amanda Pea, Charity fundraiser.

## 2013-12-29 NOTE — Telephone Encounter (Signed)
Per Vo Dr. Ewell Poe [place],NP Ok for pt to get RX for metformin Will discuss further at follow up Pt needs PCP for further refills

## 2013-12-29 NOTE — Progress Notes (Signed)
CARDIAC REHAB PHASE I   PRE:  Rate/Rhythm: 93 SR    BP: sitting 141/93    SaO2: 97 RA  MODE:  Ambulation: 760 ft   POST:  Rate/Rhythm: 93 SR    BP: sitting 136/88     SaO2: 95 RA  Tolerated well, no c/o. Slight SOB toward end. VSS. Ed reviewed with teach back. Pt beginning to have understanding, feels overwhelmed. Encouraged pt to look at positives instead of focusing on what he can't have.  [id number]   Elissa Lovett [place] CES, ACSM 12/29/2013 10:32 AM

## 2014-01-04 ENCOUNTER — Ambulatory Visit (HOSPITAL_COMMUNITY)
Admit: 2014-01-04 | Discharge: 2014-01-04 | Disposition: A | Discharge status: Home / Self Care | Payer: Self-pay | Source: Ambulatory Visit | Attending: Internal Medicine | Admitting: Internal Medicine

## 2014-01-04 ENCOUNTER — Encounter (HOSPITAL_COMMUNITY): Payer: Self-pay

## 2014-01-04 VITALS — BP 102/79 | HR 80 | Resp 18 | Wt 306.5 lb

## 2014-01-04 DIAGNOSIS — N179 Acute kidney failure, unspecified: Secondary | ICD-10-CM | POA: Insufficient documentation

## 2014-01-04 DIAGNOSIS — I5022 Chronic systolic (congestive) heart failure: Secondary | ICD-10-CM

## 2014-01-04 DIAGNOSIS — I10 Essential (primary) hypertension: Secondary | ICD-10-CM | POA: Insufficient documentation

## 2014-01-04 DIAGNOSIS — R0602 Shortness of breath: Secondary | ICD-10-CM | POA: Insufficient documentation

## 2014-01-04 HISTORY — DX: Chronic systolic (congestive) heart failure: I50.22

## 2014-01-04 LAB — BASIC METABOLIC PANEL
ANION GAP: 14 (ref 5–15)
BUN: 24 mg/dL — ABNORMAL HIGH (ref 6–23)
CHLORIDE: 101 meq/L (ref 96–112)
CO2: 24 mEq/L (ref 19–32)
Calcium: 9.3 mg/dL (ref 8.4–10.5)
Creatinine, Ser: 1.64 mg/dL — ABNORMAL HIGH (ref 0.50–1.35)
GFR calc Af Amer: 59 mL/min — ABNORMAL LOW (ref >90)
GFR calc non Af Amer: 51 mL/min — ABNORMAL LOW (ref >90)
Glucose, Bld: 85 mg/dL (ref 70–99)
Potassium: 4.2 mEq/L (ref 3.7–5.3)
SODIUM: 139 meq/L (ref 137–147)

## 2014-01-04 MED ORDER — ISOSORBIDE MONONITRATE ER 30 MG PO TB24: 30.0000 mg | ORAL_TABLET | Freq: Every day | ORAL | Status: DC

## 2014-01-04 MED ORDER — HYDRALAZINE HCL 25 MG PO TABS: 25.0000 mg | ORAL_TABLET | Freq: Three times a day (TID) | ORAL | Status: DC

## 2014-01-04 NOTE — Patient Instructions (Signed)
Doing great.  Continue Bidil until you run out of your medication. When you run out start hydralazine 25 mg three times a day and Imdur 30 mg daily.  Will schedule pulmonary appointment for sleep study.  Follow up in 6 weeks.  Do the following things EVERYDAY: 1) Weigh yourself in the morning before breakfast. Write it down and keep it in a log. 2) Take your medicines as prescribed 3) Eat low salt foods-Limit salt (sodium) to 2000 mg per day.  4) Stay as active as you can everyday 5) Limit all fluids for the day to less than 2 liters 6)

## 2014-01-04 NOTE — Progress Notes (Addendum)
Patient ID: Vernon Reed, male   DOB: 1975-12-23, 38 y.o.   MRN: [id number]  PCP: None  HPI: [name] is a 38 yo male with a history of HTN, HLD, ?OSA , obesity and chronic systolic HF.   Admitted to the hospital 1/16-1/20/15 for SOB. Had L/RHC with normal coronaries.   RA 10 RV 36/10/13 PA 34/16 (23) PCWP 20 Fick CO/CI 6.18/2.4  ECHO 07/2013: EF 30-35% ECHO 12/28/2013: EF 45-50%, grade 1 DD.   Admitted 6/23-6/25 for SOB. He was diuresed and medications restarted which he became hypotensive and were scaled back. He had AKI. Discharge weight was 310 lbs  Post Hospital Follow up for Heart Failure: Doing well. Denies SOB, orthopnea, PND or CP. Taking medications as prescribed. No dizziness. Not weighing at home he does not currently have scale. Bidil was 160$ which he paid for. Following a low salt diet and drinking less than 2L a day. Minimal fatigue. Able to go around the grocery store with no issues and go upstairs.   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 ETOH occasionally. No tobacco abuse or drugs.   FH: Mother living: - HTN, HLD       Father- HTN  ROS: All systems negative except as listed in HPI, PMH and Problem List.  Past Medical History  Diagnosis Date  . Hypertension     Current Outpatient Prescriptions  Medication Sig Dispense Refill  . aspirin EC 81 MG EC tablet Take 1 tablet (81 mg total) by mouth daily.  30 tablet  2  . carvedilol (COREG) 12.5 MG tablet Take 1 tablet (12.5 mg total) by mouth 2 (two) times daily with a meal.  30 tablet  3  . furosemide (LASIX) 40 MG tablet Take 1 tablet (40 mg total) by mouth daily.  30 tablet  3  . isosorbide-hydrALAZINE (BIDIL) 20-37.5 MG per tablet Take 1 tablet by mouth 3 (three) times daily.  90 tablet  3  . lisinopril (PRINIVIL,ZESTRIL) 20 MG tablet Take 1 tablet (20 mg total) by mouth daily.  30 tablet  3  . metFORMIN (GLUCOPHAGE) 500 MG tablet Take 1 tablet (500 mg total) by  mouth 2 (two) times daily with a meal.  60 tablet  0  . spironolactone (ALDACTONE) 25 MG tablet Take 1 tablet (25 mg total) by mouth daily.  30 tablet  3   No current facility-administered medications for this encounter.    Filed Vitals:   01/04/14 1518  BP: 102/79  Pulse: 80  Resp: 18  Weight: 306 lb 8 oz (139.027 kg)  SpO2: 95%   PHYSICAL EXAM: General:  Well appearing. No resp difficulty, GF present HEENT: normal Neck: supple. JVP flat. Carotids 2+ bilaterally; no bruits. No lymphadenopathy or thryomegaly appreciated. Cor: PMI normal. Regular rate & rhythm. No rubs, gallops or murmurs. Lungs: clear Abdomen: obese, soft, nontender, nondistended. No hepatosplenomegaly. No bruits or masses. Good bowel sounds. Extremities: no cyanosis, clubbing, rash, edema Neuro: alert & orientedx3, cranial nerves grossly intact. Moves all 4 extremities w/o difficulty. Affect pleasant.   ASSESSMENT & PLAN:  1) Chronic systolic HF: NICM, EF 45-50% (4/09816/2015) - Reviewed d/c summary and patient just in the hospital for dyspnea. He was diuresed and restarted on his medications. Developed AKI and medications scaled back. - NYHA I symptoms and volume status stable. Will continue lasix 40 mg daily. Check BMET today. He has not been weighing so we provided him with a scale. - SBP soft so  will not titrate coreg, continue 12.5 mg BID. - Continue lisinopril 20 mg daily and spiro 25 mg daily. - Spent 160 dollars on Bidil and can't afford that every month. Told to finish out and then will transition to hydralazine 25 mg TID and Imdur 30 mg daily. - Reinforced the need and importance of daily weights, a low sodium diet, and fluid restriction (less than 2 L a day). Instructed to call the HF clinic if weight increases more than 3 lbs overnight or 5 lbs in a week.  2) HTN - controlled on medications. As above will change Bidil to hydralazine and Imdur.  3) ?OSA - Patient likely has OSA. Will refer back to pulmonary  for sleep study.  4) AKI - Had AKI in the hospital with diuresis and re-initiation of medications. Will check BMET today.  F/U 6 weeks. Ulla Potash B NP-C 3:25 PM

## 2014-01-05 ENCOUNTER — Telehealth (HOSPITAL_COMMUNITY): Payer: Self-pay

## 2014-01-05 NOTE — Telephone Encounter (Signed)
Lab results reviewed with patient, instructed to hold lasix until further instructed.  Aware and agreeable. Ave Filter

## 2014-01-10 ENCOUNTER — Ambulatory Visit (INDEPENDENT_AMBULATORY_CARE_PROVIDER_SITE_OTHER): Payer: Self-pay | Admitting: Pulmonary Disease

## 2014-01-10 ENCOUNTER — Encounter: Payer: Self-pay | Admitting: Pulmonary Disease

## 2014-01-10 VITALS — BP 140/76 | HR 80 | Temp 98.3°F | Ht 72.0 in | Wt 318.0 lb

## 2014-01-10 DIAGNOSIS — G4733 Obstructive sleep apnea (adult) (pediatric): Secondary | ICD-10-CM

## 2014-01-10 NOTE — Assessment & Plan Note (Signed)
Given excessive daytime somnolence, narrow pharyngeal exam, witnessed apneas & loud snoring, obstructive sleep apnea is very likely & an overnight polysomnogram will be scheduled as a split study. The pathophysiology of obstructive sleep apnea , it's cardiovascular consequences & modes of treatment including CPAP were discused with the patient in detail & they evidenced understanding. Due to his cardiac comorbidities, would prefer sleep study and titration in the lab

## 2014-01-10 NOTE — Patient Instructions (Signed)
Schedule sleep study You will likely need a CPAP machine 

## 2014-01-10 NOTE — Progress Notes (Signed)
   Subjective:    Patient ID: Vernon Reed, male    DOB: Apr 28, 1976, 38 y.o.   MRN: [id number]  HPI 38 y.o. Man with recent hospitalization for acute systolic heart failure presents for evaluation of sleep disordered breathing. PMH significant for HTN, Nonischemic cardiomyopathy  (EF 30-35% on 07/2013), DM type 2 , CKD and obesity Loud snoring has been noted by family members, apneas were witnessed during hospital admission in by his girlfriend. He reports excessive daytime fatigue. Epworth sleepiness score is 9 Bedtime is around midnight, sleep latency is up to an hour, he sleeps on his side but also on his back with 2 pillows, reports 2-3 nocturnal awakenings including bathroom visits without any post void sleep latency and is out of bed by 7 AM with dryness of mouth and occasional headache feeling tired. He is gained about 12 pounds over the last 6 months. He works as a Emergency planning/management officer for a [employer]. There is no history suggestive of cataplexy, sleep paralysis or parasomnias He reports difficult to control hypertension for many years.    Past Medical History  Diagnosis Date  . Hypertension   . Congenital heart failure   . Diabetes   . Hyperlipemia     Past Surgical History  Procedure Laterality Date  . Appendectomy    . 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 Marital Status: Single    Spouse Name: N/A    Number of Children: 3  . Years of Education: N/A   Occupational History  . Emergency planning/management officer    Social History Main Topics  . Smoking status: Never Smoker   . Smokeless tobacco: Not on file  . Alcohol Use: Yes     Comment: occasionally  . Drug Use: No  . Sexual Activity: Not on file   Other Topics Concern  . Not on file   Social History Narrative  . No narrative on file    Family History  Problem Relation Age of Onset  . Hypertension Mother   . CAD Mother   .  Hypertension Father        Review of Systems  Constitutional: Negative for fever and unexpected weight change.  HENT: Negative for congestion, dental problem, ear pain, nosebleeds, postnasal drip, rhinorrhea, sinus pressure, sneezing, sore throat and trouble swallowing.   Eyes: Negative for redness and itching.  Respiratory: Negative for cough, chest tightness, shortness of breath and wheezing.   Cardiovascular: Negative for palpitations and leg swelling.  Gastrointestinal: Negative for nausea and vomiting.  Genitourinary: Negative for dysuria.  Musculoskeletal: Negative for joint swelling.  Skin: Negative for rash.  Neurological: Negative for headaches.  Hematological: Does not bruise/bleed easily.  Psychiatric/Behavioral: Negative for dysphoric mood. The patient is not nervous/anxious.        Objective:   Physical Exam  Gen. Pleasant, obese, in no distress, normal affect ENT - no lesions, no post nasal drip, class 2-3 airway Neck: No JVD, no thyromegaly, no carotid bruits Lungs: no use of accessory muscles, no dullness to percussion, decreased without rales or rhonchi  Cardiovascular: Rhythm regular, heart sounds  normal, no murmurs or gallops, no peripheral edema Abdomen: soft and non-tender, no hepatosplenomegaly, BS normal. Musculoskeletal: No deformities, no cyanosis or clubbing Neuro:  alert, non focal, no tremors       Assessment & Plan:

## 2014-02-15 ENCOUNTER — Other Ambulatory Visit (HOSPITAL_COMMUNITY): Payer: Self-pay | Admitting: Internal Medicine

## 2014-02-15 ENCOUNTER — Telehealth (HOSPITAL_COMMUNITY): Payer: Self-pay | Admitting: Vascular Surgery

## 2014-02-15 ENCOUNTER — Encounter (HOSPITAL_COMMUNITY): Payer: Self-pay

## 2014-02-15 NOTE — Telephone Encounter (Addendum)
Pt need to talk to [place] about a Medication that was d/C .Marland Kitchen He suppose to start something else but wasnt called in .Marland Kitchen Please advise

## 2014-02-20 NOTE — Progress Notes (Signed)
Patient ID: Vernon Reed, male   DOB: [birth year], 38 y.o.   MRN: [id number]  PCP: None  HPI: Vernon Reed is a 38 yo male with a history of HTN, HLD, ?OSA , obesity and chronic systolic HF. Admitted to the hospital 1/16-1/20/15 for SOB. Had L/RHC with normal coronaries.   Admitted 6/23-6/25 for SOB. He was diuresed and medications restarted which he became hypotensive and were scaled back. He had AKI. Discharge weight was 310 lbs  He returns for follow up. Last visit he was to transitiond to hydralazine and imdur as he could not afford Bidil. He says that worked much better. Today he says he has been out of his medications for a week and a half.  Says he lost them during a move. He did take all medications last night. Overall he is feeling good. Denies SOB/PND/Orthpnea. Has sleep study next week. Weight at home 309-310 pounds. .   RHC/LHC  07/26/13  RA 10 RV 36/10/13 PA 34/16 (23) PCWP 20 Fick CO/CI 6.18/2.4 Normal Cors   ECHO 07/2013: EF 30-35% ECHO 12/28/2013: EF 45-50%, grade 1 DD.   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 ETOH occasionally. No tobacco abuse or drugs.   FH: Mother living: - HTN, HLD       Father- HTN  ROS: All systems negative except as listed in HPI, PMH and Problem List.  Past Medical History  Diagnosis Date  . Hypertension   . Congenital heart failure   . Diabetes   . Hyperlipemia     Current Outpatient Prescriptions  Medication Sig Dispense Refill  . aspirin EC 81 MG EC tablet Take 1 tablet (81 mg total) by mouth daily.  30 tablet  2  . carvedilol (COREG) 12.5 MG tablet Take 1 tablet (12.5 mg total) by mouth 2 (two) times daily with a meal.  30 tablet  3  . furosemide (LASIX) 40 MG tablet Take 40 mg by mouth as needed.      . hydrALAZINE (APRESOLINE) 25 MG tablet Take 1 tablet (25 mg total) by mouth 3 (three) times daily.  90 tablet  6  . isosorbide mononitrate (IMDUR) 30 MG  24 hr tablet Take 1 tablet (30 mg total) by mouth daily.  30 tablet  3  . lisinopril (PRINIVIL,ZESTRIL) 20 MG tablet Take 1 tablet (20 mg total) by mouth daily.  30 tablet  3  . metFORMIN (GLUCOPHAGE) 500 MG tablet TAKE ONE TABLET BY MOUTH TWICE DAILY WITH A MEAL  60 tablet  0  . spironolactone (ALDACTONE) 25 MG tablet Take 1 tablet (25 mg total) by mouth daily.  30 tablet  0   No current facility-administered medications for this encounter.    Filed Vitals:   02/21/14 0923  BP: 160/100  Pulse: 87  Weight: 310 lb 9.6 oz (140.887 kg)  SpO2: 96%   PHYSICAL EXAM: General:  Well appearing. No resp difficulty HEENT: normal Neck: supple. JVP flat. Carotids 2+ bilaterally; no bruits. No lymphadenopathy or thryomegaly appreciated. Cor: PMI normal. Regular rate & rhythm. No rubs, gallops or murmurs. Lungs: clear Abdomen: obese, soft, nontender, nondistended. No hepatosplenomegaly. No bruits or masses. Good bowel sounds. Extremities: no cyanosis, clubbing, rash, edema Neuro: alert & orientedx3, cranial nerves grossly intact. Moves all 4 extremities w/o difficulty. Affect pleasant.   ASSESSMENT & PLAN:  1) Chronic systolic HF: NICM, EF 45-50% (10/979) -  BP elevated but off meds for  the last week and a half.. Restarted last night but not spironolactone. Last visit BP well controlled on HF meds. Will not up titrate meds as he has just restarted HF meds last night. I have reordered Sprionolactone 25 mg today.  NYHA I symptoms and volume status stable. Continue lasix 40 mg daily and 25 mg spironolactone daily.  - Continue coreg 12.5 mg twice a day - Continue lisinopril 20 mg daily.  - Continue hydralazine 25 mg TID and Imdur 30 mg daily. - Reinforced the need and importance of daily weights, a low sodium diet, and fluid restriction (less than 2 L a day). Instructed to call the HF clinic if weight increases more than 3 lbs overnight or 5 lbs in a week.  At next visit will need BMET  2) HTN -  Elevated but as above had been off his medications.   3) ?OSA -Has sleep study next week.   4) AKI - Check BMET in 2 weeks.   Follow up in 2 weeks to reassess BP and check BMET.  Loukisha Gunnerson NP-C 9:55 AM

## 2014-02-21 ENCOUNTER — Ambulatory Visit (HOSPITAL_COMMUNITY)
Admission: RE | Admit: 2014-02-21 | Discharge: 2014-02-21 | Disposition: A | Discharge status: Home / Self Care | Payer: BC Managed Care – PPO | Source: Ambulatory Visit | Attending: Internal Medicine | Admitting: Internal Medicine

## 2014-02-21 ENCOUNTER — Encounter (HOSPITAL_COMMUNITY): Payer: Self-pay

## 2014-02-21 VITALS — BP 160/100 | HR 87 | Wt 310.6 lb

## 2014-02-21 DIAGNOSIS — G4733 Obstructive sleep apnea (adult) (pediatric): Secondary | ICD-10-CM

## 2014-02-21 DIAGNOSIS — E785 Hyperlipidemia, unspecified: Secondary | ICD-10-CM | POA: Insufficient documentation

## 2014-02-21 DIAGNOSIS — E119 Type 2 diabetes mellitus without complications: Secondary | ICD-10-CM | POA: Diagnosis not present

## 2014-02-21 DIAGNOSIS — E669 Obesity, unspecified: Secondary | ICD-10-CM | POA: Diagnosis not present

## 2014-02-21 DIAGNOSIS — N179 Acute kidney failure, unspecified: Secondary | ICD-10-CM | POA: Insufficient documentation

## 2014-02-21 DIAGNOSIS — I509 Heart failure, unspecified: Secondary | ICD-10-CM | POA: Diagnosis not present

## 2014-02-21 DIAGNOSIS — Z7982 Long term (current) use of aspirin: Secondary | ICD-10-CM | POA: Insufficient documentation

## 2014-02-21 DIAGNOSIS — I10 Essential (primary) hypertension: Secondary | ICD-10-CM | POA: Insufficient documentation

## 2014-02-21 DIAGNOSIS — I5022 Chronic systolic (congestive) heart failure: Secondary | ICD-10-CM | POA: Diagnosis present

## 2014-02-21 MED ORDER — SPIRONOLACTONE 25 MG PO TABS: 25.0000 mg | ORAL_TABLET | Freq: Every day | ORAL | Status: DC

## 2014-02-21 NOTE — Patient Instructions (Addendum)
Follow up in 2 weeks  Please pick up spironolactone today   Take 25 mg of Spironolactone daily  Do the following things EVERYDAY: 1) Weigh yourself in the morning before breakfast. Write it down and keep it in a log. 2) Take your medicines as prescribed 3) Eat low salt foods-Limit salt (sodium) to 2000 mg per day.  4) Stay as active as you can everyday 5) Limit all fluids for the day to less than 2 liters

## 2014-02-27 ENCOUNTER — Telehealth (HOSPITAL_COMMUNITY): Payer: Self-pay | Admitting: Vascular Surgery

## 2014-02-27 NOTE — Telephone Encounter (Signed)
Pt called complaining of headaches... He thinks its one of his medications he is on .Marland Kitchen Please advise

## 2014-03-01 ENCOUNTER — Ambulatory Visit (HOSPITAL_BASED_OUTPATIENT_CLINIC_OR_DEPARTMENT_OTHER): Payer: BC Managed Care – PPO | Attending: Pulmonary Disease | Admitting: Radiology

## 2014-03-01 VITALS — Ht 72.0 in | Wt 310.0 lb

## 2014-03-01 DIAGNOSIS — E669 Obesity, unspecified: Secondary | ICD-10-CM | POA: Diagnosis not present

## 2014-03-01 DIAGNOSIS — I5022 Chronic systolic (congestive) heart failure: Secondary | ICD-10-CM | POA: Insufficient documentation

## 2014-03-01 DIAGNOSIS — E119 Type 2 diabetes mellitus without complications: Secondary | ICD-10-CM | POA: Diagnosis not present

## 2014-03-01 DIAGNOSIS — I509 Heart failure, unspecified: Secondary | ICD-10-CM | POA: Diagnosis not present

## 2014-03-01 DIAGNOSIS — N189 Chronic kidney disease, unspecified: Secondary | ICD-10-CM | POA: Diagnosis not present

## 2014-03-01 DIAGNOSIS — G4733 Obstructive sleep apnea (adult) (pediatric): Secondary | ICD-10-CM | POA: Diagnosis not present

## 2014-03-03 ENCOUNTER — Telehealth: Payer: Self-pay | Admitting: Pulmonary Disease

## 2014-03-03 DIAGNOSIS — G4733 Obstructive sleep apnea (adult) (pediatric): Secondary | ICD-10-CM

## 2014-03-03 DIAGNOSIS — G4730 Sleep apnea, unspecified: Secondary | ICD-10-CM

## 2014-03-03 DIAGNOSIS — G4710 Hypersomnia, unspecified: Secondary | ICD-10-CM

## 2014-03-03 NOTE — Sleep Study (Signed)
[facility]  NAME: Vernon Reed  DATE OF BIRTH: 04/19/1976  MEDICAL RECORD NUMBER [id number]  LOCATION: [facility]  PHYSICIAN: Rekha Hobbins V.  DATE OF STUDY: 03/01/14  SLEEP STUDY TYPE: Split Polysomnogram               REFERRING PHYSICIAN: Oretha Milch, MD  INDICATION FOR STUDY: 38 year old man with chronic systolic heart failure , diabetes , CK D. and obesity presented with excessive daytime fatigue , loud snoring and witnessed apneas .At the time of this study ,they weighed 310 pounds with a height of  6 ft 0 inches and the BMI of 42, neck size of 17.5 inches. Epworth sleepiness score was 9   This intervention polysomnogram was performed with a sleep technologist in attendance. EEG, EOG,EMG and respiratory parameters recorded. Sleep stages, arousals, limb movements and respiratory data was scored according to criteria laid out by the American Academy of sleep medicine.  SLEEP ARCHITECTURE: Lights out was at 2308 PM and lights on was at 550 AM. During the baseline portion ,total sleep time was 120 minutes with sleep latency of 2.5 minutes with latency to REM sleep of 76 minutes and wake after sleep onset of 5 minutes. Sleep efficiency was good. Sleep stages as a percentage of total sleep time was N1 -9 %,N2- 83 % and REM sleep 8 % ( 9.5 minutes) . During titration REM sleep accounted for 113 minutes and supine sleep was noted .The longest period of REM sleep was around 5 AM.   AROUSAL DATA : At baseline ,there were 18 arousals with an arousal index of 9 events per hour. Of these 8 were spontaneous, and 10 were associated with respiratory events and 0 were associated periodic limb movements. During titration, the arousal index was 1.6  events per hour  RESPIRATORY DATA: During the baseline portion,there were 24 obstructive apneas, 2 central apneas, 0 mixed apneas and 40 hypopneas with apnea -hypopnea index of 33 events per hour.  There was no  relation to sleep stage or body position. Due to this degree of respiratory disturbance CPAP was initiated at 5 cm and titrated to find a level of 10 cm due to persistent events during supine sleep. At the level of 10 cm for 224 minutes of sleep, 1 apneas and 3 hypopneas is were noted. Titration was optimal and supine REM sleep was noted.  MOVEMENT/PARASOMNIA: There were 0 PLMS with a PLM index of 0 events per hour. The PLM arousal index was 0 events per hour. PLMS seemed to improve during titration.  OXYGEN DATA: The lowest desaturation was 80 % during non-REM sleep and the desaturation index was 30 per hour. This was corrected by titration and the saturations stayed below 88% for 10 minutes during titration.  CARDIAC DATA: The low heart rate was 36 beats per minute. The high heart rate recorded was an artifact. No arrhythmias were noted  Discussion-he was desensitized with a medium fullface mask. Titration was optimal. REM rebound was noted.  IMPRESSION :  1. severe obstructive sleep apnea with hypopneas causing sleep fragmentation and mild oxygen desaturation. 2. This was corrected by CPAP of 10 centimeters with medium fullface mask. Titration was optimal 3. No evidence of cardiac arrhythmias or behavioral disturbance during sleep. Periodic limb movements were not noted.  RECOMMENDATION:    1. The treatment options for this degree of sleep disordered breathing includes weight loss and CPAP therapy. 2. CPAP can be initiated at 10 centimeters with  a medium fullface mask and compliance monitored at this level. 3. Patient should be cautioned against driving when sleepy.They should be asked to avoid medications with sedative side effects  Oretha Milch MD Diplomate, American Board of Sleep Medicine  ELECTRONICALLY SIGNED ON: 03/03/2014  [facility] PH: [phone]   FX: [phone] ACCREDITED BY THE AMERICAN ACADEMY OF SLEEP MEDICINE

## 2014-03-03 NOTE — Telephone Encounter (Signed)
Spoke with pt and notified of recs per RA  Pt verbalized understanding  OV set for 05/15/14  Pt to call sooner if needed

## 2014-03-03 NOTE — Telephone Encounter (Signed)
He was noted to have severe OSA-33 per hour Prescription sent to DME for CPAP machine 10 cm with medium fullface mask Arrange office visit in 6 weeks

## 2014-03-05 NOTE — Progress Notes (Signed)
Patient ID: Vernon Reed, male   DOB: 07/25/1975, 38 y.o.   MRN: [id number]  PCP: None  HPI: Vernon Reed is a 38 yo male with a history of HTN, HLD, ?OSA , obesity and chronic systolic HF. Admitted to the hospital 1/16-1/20/15 for SOB. Had L/RHC with normal coronaries.   Admitted 6/23-6/25 for SOB. He was diuresed and medications restarted which he became hypotensive and were scaled back. He had AKI. Discharge weight was 310 lbs  He returns for follow up. Last visit he restarted HF meds after being out of them for a few weeks.  Denies SOB/PND/Orthopnea. Had sleep study last week.  Will need to start CPAP. Plans to pick up  CPAP at [facility] next week. Taking all medications. Walking 1 mile per day.   RHC/LHC  07/26/13  RA 10 RV 36/10/13 PA 34/16 (23) PCWP 20124 77 Fick CO/CI 6.18/2.4 Normal Cors   ECHO 07/2013: EF 30-35% ECHO 12/28/2013: EF 45-50%, grade 1 DD.   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 ETOH occasionally. No tobacco abuse or drugs.   FH: Mother living: - HTN, HLD       Father- HTN  ROS: All systems negative except as listed in HPI, PMH and Problem List.  Past Medical History  Diagnosis Date  . Hypertension   . Congenital heart failure   . Diabetes   . Hyperlipemia     Current Outpatient Prescriptions  Medication Sig Dispense Refill  . aspirin EC 81 MG EC tablet Take 1 tablet (81 mg total) by mouth daily.  30 tablet  2  . carvedilol (COREG) 12.5 MG tablet Take 1 tablet (12.5 mg total) by mouth 2 (two) times daily with a meal.  30 tablet  3  . furosemide (LASIX) 40 MG tablet Take 40 mg by mouth as needed.      . hydrALAZINE (APRESOLINE) 25 MG tablet Take 1 tablet (25 mg total) by mouth 3 (three) times daily.  90 tablet  6  . isosorbide mononitrate (IMDUR) 30 MG 24 hr tablet Take 1 tablet (30 mg total) by mouth daily.  30 tablet  3  . lisinopril (PRINIVIL,ZESTRIL) 20 MG tablet Take 1 tablet  (20 mg total) by mouth daily.  30 tablet  3  . metFORMIN (GLUCOPHAGE) 500 MG tablet TAKE ONE TABLET BY MOUTH TWICE DAILY WITH A MEAL  60 tablet  0  . spironolactone (ALDACTONE) 25 MG tablet Take 1 tablet (25 mg total) by mouth daily.  30 tablet  0   No current facility-administered medications for this encounter.    Filed Vitals:   03/07/14 1109  BP: 124/77  Pulse: 87  Weight: 310 lb (140.615 kg)  SpO2: 97%   PHYSICAL EXAM: General:  Well appearing. No resp difficulty. Ambulated in the clinic without difficulty HEENT: normal Neck: supple. JVP flat. Carotids 2+ bilaterally; no bruits. No lymphadenopathy or thryomegaly appreciated. Cor: PMI normal. Regular rate & rhythm. No rubs, gallops or murmurs. Lungs: clear Abdomen: obese, soft, nontender, nondistended. No hepatosplenomegaly. No bruits or masses. Good bowel sounds. Extremities: no cyanosis, clubbing, rash, edema Neuro: alert & orientedx3, cranial nerves grossly intact. Moves all 4 extremities w/o difficulty. Affect pleasant.   ASSESSMENT & PLAN:  1) Chronic systolic HF: NICM, EF 45-50% (07/3242) -  NYHA I. Volume status stable. Continue lasix as needed and 25 mg spironolactone daily.  - Continue coreg 12.5 mg twice a day - Continue  lisinopril 20 mg daily.  - Continue hydralazine 25 mg TID and Imdur 30 mg daily. - Reinforced the need and importance of daily weights, a low sodium diet, and fluid restriction (less than 2 L a day). Instructed to call the HF clinic if weight increases more than 3 lbs overnight or 5 lbs in a week.  Check BMET now.   2) HTN - Much  improved . Continue current medications.     3) ?OSA -Completed sleep study . Starting CPAP next week.    4) AKI - Check BMET today.    Follow up in 6 months Kamilya Wakeman NP-C 11:12 AM

## 2014-03-06 NOTE — Telephone Encounter (Signed)
Multiple attempts to contact pt All attempts unsuccessful

## 2014-03-07 ENCOUNTER — Ambulatory Visit (HOSPITAL_COMMUNITY)
Admission: RE | Admit: 2014-03-07 | Discharge: 2014-03-07 | Disposition: A | Discharge status: Home / Self Care | Payer: BC Managed Care – PPO | Source: Ambulatory Visit | Attending: Cardiology | Admitting: Cardiology

## 2014-03-07 VITALS — BP 124/77 | HR 87 | Wt 310.0 lb

## 2014-03-07 DIAGNOSIS — I5022 Chronic systolic (congestive) heart failure: Secondary | ICD-10-CM | POA: Insufficient documentation

## 2014-03-07 DIAGNOSIS — E119 Type 2 diabetes mellitus without complications: Secondary | ICD-10-CM | POA: Diagnosis not present

## 2014-03-07 DIAGNOSIS — E669 Obesity, unspecified: Secondary | ICD-10-CM | POA: Insufficient documentation

## 2014-03-07 DIAGNOSIS — I10 Essential (primary) hypertension: Secondary | ICD-10-CM | POA: Insufficient documentation

## 2014-03-07 DIAGNOSIS — Z7982 Long term (current) use of aspirin: Secondary | ICD-10-CM | POA: Diagnosis not present

## 2014-03-07 DIAGNOSIS — Z6841 Body Mass Index (BMI) 40.0 and over, adult: Secondary | ICD-10-CM | POA: Diagnosis not present

## 2014-03-07 DIAGNOSIS — Z8249 Family history of ischemic heart disease and other diseases of the circulatory system: Secondary | ICD-10-CM | POA: Insufficient documentation

## 2014-03-07 DIAGNOSIS — N179 Acute kidney failure, unspecified: Secondary | ICD-10-CM | POA: Insufficient documentation

## 2014-03-07 DIAGNOSIS — G4733 Obstructive sleep apnea (adult) (pediatric): Secondary | ICD-10-CM | POA: Insufficient documentation

## 2014-03-07 DIAGNOSIS — I509 Heart failure, unspecified: Secondary | ICD-10-CM | POA: Insufficient documentation

## 2014-03-07 LAB — BASIC METABOLIC PANEL
ANION GAP: 10 (ref 5–15)
BUN: 12 mg/dL (ref 6–23)
CHLORIDE: 103 meq/L (ref 96–112)
CO2: 26 mEq/L (ref 19–32)
Calcium: 8.6 mg/dL (ref 8.4–10.5)
Creatinine, Ser: 1.18 mg/dL (ref 0.50–1.35)
GFR calc non Af Amer: 76 mL/min — ABNORMAL LOW (ref >90)
GFR, EST AFRICAN AMERICAN: 88 mL/min — AB (ref >90)
Glucose, Bld: 102 mg/dL — ABNORMAL HIGH (ref 70–99)
POTASSIUM: 4.2 meq/L (ref 3.7–5.3)
SODIUM: 139 meq/L (ref 137–147)

## 2014-03-07 NOTE — Patient Instructions (Signed)
Follow up in 6 months  Do the following things EVERYDAY: 1) Weigh yourself in the morning before breakfast. Write it down and keep it in a log. 2) Take your medicines as prescribed 3) Eat low salt foods-Limit salt (sodium) to 2000 mg per day.  4) Stay as active as you can everyday 5) Limit all fluids for the day to less than 2 liters 

## 2014-03-17 ENCOUNTER — Telehealth: Payer: Self-pay | Admitting: Pulmonary Disease

## 2014-03-17 NOTE — Telephone Encounter (Signed)
I have sent pt an email advising him to call [facility].  Called pt as well and LMTCB x1

## 2014-03-20 NOTE — Telephone Encounter (Signed)
i have called and lmomtcb for pt to see if he was able to contact [facility].

## 2014-03-21 NOTE — Telephone Encounter (Signed)
Vernon Reed Vernon Reed, CMA            We have still not heard from him Vernon Reed.    LMTCB for patient- we need to get him to call [facility] and arrange set up.

## 2014-03-21 NOTE — Telephone Encounter (Signed)
I have sent staff message to [facility] to advise if patient has set up appt for set up with them yet.

## 2014-03-22 NOTE — Telephone Encounter (Signed)
LMTCB

## 2014-03-23 NOTE — Telephone Encounter (Signed)
lmomtcb x 3  

## 2014-03-24 NOTE — Telephone Encounter (Signed)
LMOM TCB x4 Will close message per triage protocol - advised new message will be generated when he call back Staff message sent to [facility] informing her of this

## 2014-05-12 ENCOUNTER — Other Ambulatory Visit (HOSPITAL_COMMUNITY): Payer: Self-pay | Admitting: Surgery

## 2014-05-12 MED ORDER — METFORMIN HCL 500 MG PO TABS: 500.0000 mg | ORAL_TABLET | Freq: Two times a day (BID) | ORAL | Status: DC

## 2014-05-15 ENCOUNTER — Ambulatory Visit: Payer: BC Managed Care – PPO | Admitting: Pulmonary Disease

## 2014-06-15 ENCOUNTER — Encounter (HOSPITAL_COMMUNITY): Payer: Self-pay | Admitting: Cardiology

## 2014-08-22 ENCOUNTER — Encounter (HOSPITAL_COMMUNITY): Payer: Self-pay | Admitting: *Deleted

## 2014-08-22 ENCOUNTER — Emergency Department (HOSPITAL_COMMUNITY)
Admission: EM | Admit: 2014-08-22 | Discharge: 2014-08-22 | Disposition: A | Discharge status: Home / Self Care | Payer: Self-pay | Attending: Emergency Medicine | Admitting: Emergency Medicine

## 2014-08-22 ENCOUNTER — Emergency Department (HOSPITAL_COMMUNITY): Payer: Self-pay

## 2014-08-22 DIAGNOSIS — K5900 Constipation, unspecified: Secondary | ICD-10-CM | POA: Insufficient documentation

## 2014-08-22 DIAGNOSIS — I10 Essential (primary) hypertension: Secondary | ICD-10-CM | POA: Insufficient documentation

## 2014-08-22 DIAGNOSIS — K5909 Other constipation: Secondary | ICD-10-CM

## 2014-08-22 DIAGNOSIS — K219 Gastro-esophageal reflux disease without esophagitis: Secondary | ICD-10-CM | POA: Insufficient documentation

## 2014-08-22 DIAGNOSIS — E119 Type 2 diabetes mellitus without complications: Secondary | ICD-10-CM | POA: Insufficient documentation

## 2014-08-22 DIAGNOSIS — Z79899 Other long term (current) drug therapy: Secondary | ICD-10-CM | POA: Insufficient documentation

## 2014-08-22 DIAGNOSIS — I509 Heart failure, unspecified: Secondary | ICD-10-CM | POA: Insufficient documentation

## 2014-08-22 DIAGNOSIS — Z7982 Long term (current) use of aspirin: Secondary | ICD-10-CM | POA: Insufficient documentation

## 2014-08-22 HISTORY — DX: Heart failure, unspecified: I50.9

## 2014-08-22 LAB — CBC
HEMATOCRIT: 40.5 % (ref 39.0–52.0)
HEMOGLOBIN: 13.3 g/dL (ref 13.0–17.0)
MCH: 29.4 pg (ref 26.0–34.0)
MCHC: 32.8 g/dL (ref 30.0–36.0)
MCV: 89.4 fL (ref 78.0–100.0)
Platelets: 244 10*3/uL (ref 150–400)
RBC: 4.53 MIL/uL (ref 4.22–5.81)
RDW: 13.4 % (ref 11.5–15.5)
WBC: 9.8 10*3/uL (ref 4.0–10.5)

## 2014-08-22 LAB — BASIC METABOLIC PANEL
Anion gap: 4 — ABNORMAL LOW (ref 5–15)
BUN: 15 mg/dL (ref 6–23)
CHLORIDE: 102 mmol/L (ref 96–112)
CO2: 32 mmol/L (ref 19–32)
CREATININE: 1.29 mg/dL (ref 0.50–1.35)
Calcium: 8.9 mg/dL (ref 8.4–10.5)
GFR calc non Af Amer: 68 mL/min — ABNORMAL LOW (ref >90)
GFR, EST AFRICAN AMERICAN: 79 mL/min — AB (ref >90)
Glucose, Bld: 122 mg/dL — ABNORMAL HIGH (ref 70–99)
Potassium: 3.6 mmol/L (ref 3.5–5.1)
SODIUM: 138 mmol/L (ref 135–145)

## 2014-08-22 LAB — BRAIN NATRIURETIC PEPTIDE: B NATRIURETIC PEPTIDE 5: 191.4 pg/mL — AB (ref 0.0–100.0)

## 2014-08-22 LAB — D-DIMER, QUANTITATIVE (NOT AT ARMC)

## 2014-08-22 LAB — I-STAT TROPONIN, ED: TROPONIN I, POC: 0.02 ng/mL (ref 0.00–0.08)

## 2014-08-22 MED ORDER — OMEPRAZOLE 20 MG PO CPDR: 20.0000 mg | DELAYED_RELEASE_CAPSULE | Freq: Every day | ORAL | Status: DC

## 2014-08-22 MED ORDER — GI COCKTAIL ~~LOC~~
30.0000 mL | Freq: Once | ORAL | Status: AC
Start: 2014-08-22 — End: 2014-08-22
  Administered 2014-08-22: 30 mL via ORAL
  Filled 2014-08-22: qty 30

## 2014-08-22 MED ORDER — SPIRONOLACTONE 25 MG PO TABS: 25.0000 mg | ORAL_TABLET | Freq: Every day | ORAL | Status: DC

## 2014-08-22 MED ORDER — ALBUTEROL SULFATE HFA 108 (90 BASE) MCG/ACT IN AERS: 1.0000 | INHALATION_SPRAY | Freq: Four times a day (QID) | RESPIRATORY_TRACT | Status: DC | PRN

## 2014-08-22 MED ORDER — POLYETHYLENE GLYCOL 3350 17 GM/SCOOP PO POWD: 17.0000 g | Freq: Every day | ORAL | Status: DC

## 2014-08-22 MED ORDER — AMLODIPINE BESYLATE 5 MG PO TABS
5.0000 mg | ORAL_TABLET | Freq: Once | ORAL | Status: AC
  Administered 2014-08-22: 5 mg via ORAL
  Filled 2014-08-22: qty 1

## 2014-08-22 NOTE — ED Notes (Signed)
Pt c/o increased shortness of breath x 3 days, worsening on exertion and generalized chest pressure. Hx of CHF; reports having a prescription for lasix to take as needed due to "my kidney function, the doctor told me to lay off of it for now." Pt denies changes in sleep patterns or any noticeable changes in weight gain.

## 2014-08-22 NOTE — ED Notes (Signed)
Pt updated on plan of care; awaiting lab results.

## 2014-08-22 NOTE — ED Provider Notes (Signed)
CSN: [id number]     Arrival date & time 08/22/14  0218 History  This chart was scribed for Vernon Mano Smitty Cords, MD by Milly Jakob, ED Scribe. The patient was seen in room A06C/A06C. Patient's care was started at 2:32 AM.   Chief Complaint  Patient presents with  . Shortness of Breath   Patient is a 39 y.o. male presenting with shortness of breath. The history is provided by the patient. No language interpreter was used.  Shortness of Breath Severity:  Moderate Onset quality:  Gradual Duration:  1 week Timing:  Constant Progression:  Unchanged Chronicity:  Recurrent Context: not strong odors and not URI   Relieved by:  Nothing Worsened by:  Nothing tried Ineffective treatments:  None tried Associated symptoms: wheezing   Associated symptoms: no abdominal pain, no chest pain, no claudication, no cough, no diaphoresis, no fever, no sore throat, no syncope and no vomiting   Risk factors: no recent alcohol use    HPI Comments: Vernon Reed is a 39 y.o. male with a history of CHF, hyperlipidemia, and DM who presents to the Emergency Department complaining of intermittent SOB which began 1 week ago and became acutely worse earlier this evening. He reports associated chest tightness,  coughing, wheezing, and nasal congestion. He states that his SOB is usually relieved with rest and sitting up, but tonight it did not get better.  He states that his SOB is usually worse at night. He states that the last time this happened was 1 year ago, and he was seen here in the ED. He states that he does not always take his medications regularly, including spirolactone and aspirin. He states that he was told to lose weight at that time and he has not. He denies a history of asthma. He denies using breathing treatments at home. He does not see a PCP.   Past Medical History  Diagnosis Date  . Hypertension   . Congenital heart failure   . Diabetes   . Hyperlipemia   . CHF (congestive heart failure)     Past Surgical History  Procedure Laterality Date  . Appendectomy    . Abdominal surgery      tumor removed from abd. non milignant  . Left and right heart catheterization with coronary angiogram N/A 07/26/2013    Procedure: LEFT AND RIGHT HEART CATHETERIZATION WITH CORONARY ANGIOGRAM;  Surgeon: Marykay Lex, MD;  Location: [facility] CATH LAB;  Service: Cardiovascular;  Laterality: N/A;   Family History  Problem Relation Age of Onset  . Hypertension Mother   . CAD Mother   . Hypertension Father    History  Substance Use Topics  . Smoking status: Never Smoker   . Smokeless tobacco: Not on file  . Alcohol Use: Yes     Comment: occasionally    Review of Systems  Constitutional: Negative for fever and diaphoresis.  HENT: Positive for congestion. Negative for sore throat.   Respiratory: Positive for chest tightness, shortness of breath and wheezing. Negative for cough.   Cardiovascular: Negative for chest pain, palpitations, claudication, leg swelling and syncope.  Gastrointestinal: Negative for vomiting and abdominal pain.  All other systems reviewed and are negative.  Allergies  Review of patient's allergies indicates no known allergies.  Home Medications   Prior to Admission medications   Medication Sig Start Date End Date Taking? Authorizing Provider  aspirin EC 81 MG EC tablet Take 1 tablet (81 mg total) by mouth daily. 07/28/13   Esperanza Sheets,  MD  carvedilol (COREG) 12.5 MG tablet Take 1 tablet (12.5 mg total) by mouth 2 (two) times daily with a meal. 12/29/13   Aundria Rud, NP  furosemide (LASIX) 40 MG tablet Take 40 mg by mouth as needed.    Historical Provider, MD  hydrALAZINE (APRESOLINE) 25 MG tablet Take 1 tablet (25 mg total) by mouth 3 (three) times daily. 01/04/14   Aundria Rud, NP  isosorbide mononitrate (IMDUR) 30 MG 24 hr tablet Take 1 tablet (30 mg total) by mouth daily. 01/04/14   Aundria Rud, NP  lisinopril (PRINIVIL,ZESTRIL) 20 MG tablet Take 1 tablet  (20 mg total) by mouth daily. 12/29/13   Aundria Rud, NP  metFORMIN (GLUCOPHAGE) 500 MG tablet Take 1 tablet (500 mg total) by mouth 2 (two) times daily with a meal. 05/12/14   Dolores Patty, MD  spironolactone (ALDACTONE) 25 MG tablet Take 1 tablet (25 mg total) by mouth daily. 02/21/14   Amy D Filbert Schilder, NP   Triage Vitals: BP 182/125 mmHg  Pulse 102  Temp(Src) 98.3 F (36.8 C) (Oral)  Resp 18  SpO2 94% Physical Exam  Constitutional: He is oriented to person, place, and time. He appears well-developed and well-nourished. No distress.  HENT:  Head: Normocephalic and atraumatic.  Mouth/Throat: Oropharynx is clear and moist.  Eyes: Conjunctivae and EOM are normal. Pupils are equal, round, and reactive to light.  Neck: Normal range of motion. Neck supple. No tracheal deviation present.  Cardiovascular: Normal rate, regular rhythm and intact distal pulses.   Pulmonary/Chest: Effort normal and breath sounds normal. No respiratory distress. He has no wheezes. He has no rales. He exhibits no tenderness.  Abdominal: Soft. Bowel sounds are increased. There is no tenderness. There is no rebound, no guarding, no tenderness at McBurney's point and negative Murphy's sign.  Hyperactive bowel sounds into the thoracic cavity. Stool palpable.   Musculoskeletal: Normal range of motion.  Neurological: He is alert and oriented to person, place, and time.  Skin: Skin is warm and dry.  Psychiatric: He has a normal mood and affect. His behavior is normal.  Nursing note and vitals reviewed.  ED Course  Procedures (including critical care time) DIAGNOSTIC STUDIES: Oxygen Saturation is 94% on room air, normal by my interpretation.    COORDINATION OF CARE: 2:42 AM-Discussed treatment plan which includes EKG, CXR, and lab work with pt at bedside and pt agreed to plan.   3:15 AM- Discussed treatment plan which includes GI Cocktail with pt at bedside and pt agreed to plan.    Labs Review Labs Reviewed   CBC  BASIC METABOLIC PANEL  BRAIN NATRIURETIC PEPTIDE  D-DIMER, QUANTITATIVE  I-STAT TROPOININ, ED    Imaging Review Dg Chest 2 View  08/22/2014   CLINICAL DATA:  Chest tightness and dyspnea  EXAM: CHEST  2 VIEW  COMPARISON:  12/27/2013  FINDINGS: There is unchanged cardiomegaly and mild aortic tortuosity. The lungs are clear. The pulmonary vasculature is normal. There are no pleural effusions. Hilar, mediastinal and cardiac contours appear unremarkable and unchanged.  IMPRESSION: Cardiomegaly.  No acute findings.   Electronically Signed   By: Ellery Plunk M.D.   On: 08/22/2014 02:45     EKG Interpretation None      MDM   Final diagnoses:  None   Gurgling in chest gone, symptoms improved post medication.  Patient needs his CPAP and needs to stop eating 3 hours before bed time no greasy and spicy foods, inhaler PRN wheezing.  In the setting of > 8 hours of continuous symptoms with negative EKG and troponin ACS is excluded.  Story is consistent with GERD.  Rules out for a PE.  Follow up with PMD for ongoing care.  Strict return precations given patient and and wife verbalize understanding and agree to follow up  I personally performed the services described in this documentation, which was scribed in my presence. The recorded information has been reviewed and is accurate.     Jasmine Awe, MD 08/22/14 [phone]

## 2014-08-22 NOTE — ED Notes (Signed)
Pt ambulated to restroom with steady gait; oxygen saturation 89-90% on room air after ambulating. After resting, oxygen sats increased to 93%

## 2014-08-22 NOTE — ED Notes (Signed)
Pt to ED from home c/o increased shortness of breath x 3 days with chest pressure.

## 2014-08-23 ENCOUNTER — Telehealth: Payer: Self-pay | Admitting: Pulmonary Disease

## 2014-08-23 NOTE — Telephone Encounter (Signed)
lmtcb X1 for pt  

## 2014-08-24 NOTE — Telephone Encounter (Signed)
lmomtcb x1 

## 2014-08-24 NOTE — Telephone Encounter (Signed)
Staff message response from Melissa: Vernon Reed,  I was able to find out that we did not lose this patient's CPAP order from last August. We still have it. The patient has missed his scheduled appts, and we haven't been able to contact him to reschedule.  Let me know if we can help further.  Thanks  -- lmtcb x1 for pt.

## 2014-08-24 NOTE — Telephone Encounter (Signed)
Spoke with pt. States that he was told that [facility] has lost his CPAP order from 02/18/14. Advised pt that I would get in touch with Melissa and find out what's going on. Will send staff message to [facility].

## 2014-08-24 NOTE — Telephone Encounter (Signed)
Pt returned call  [phone]

## 2014-08-25 NOTE — Telephone Encounter (Signed)
lmtcb for pt.  

## 2014-08-28 NOTE — Telephone Encounter (Signed)
LMTCB

## 2014-08-29 NOTE — Telephone Encounter (Signed)
lmtcb for pt.  

## 2014-08-30 NOTE — Telephone Encounter (Signed)
lmtcb x5

## 2014-08-31 ENCOUNTER — Encounter: Payer: Self-pay | Admitting: Emergency Medicine

## 2014-08-31 NOTE — Telephone Encounter (Signed)
Called and spoke to pt. Informed pt of the recs per Melissa. Pt verbalized understanding and denied any further questions or concerns at this time.

## 2014-12-05 ENCOUNTER — Telehealth: Payer: Self-pay | Admitting: Pulmonary Disease

## 2014-12-05 NOTE — Telephone Encounter (Signed)
Vernon Reed will you do this for me thanks libby

## 2014-12-05 NOTE — Telephone Encounter (Signed)
Called and spoke to patient, advised him that I would send his request to [facility] to get him started on the program.    Almyra Free - please get patient started on the CPAP program, he does not have insurance.    Thanks.

## 2014-12-06 ENCOUNTER — Encounter (HOSPITAL_COMMUNITY): Payer: Self-pay | Admitting: *Deleted

## 2014-12-06 ENCOUNTER — Emergency Department (HOSPITAL_COMMUNITY): Payer: Self-pay

## 2014-12-06 ENCOUNTER — Encounter (HOSPITAL_COMMUNITY): Payer: Self-pay | Admitting: General Practice

## 2014-12-06 ENCOUNTER — Emergency Department (INDEPENDENT_AMBULATORY_CARE_PROVIDER_SITE_OTHER)
Admission: EM | Admit: 2014-12-06 | Discharge: 2014-12-06 | Disposition: A | Discharge status: Nursing Home (Includes ICF) | Payer: Self-pay | Source: Home / Self Care | Attending: Family Medicine | Admitting: Family Medicine

## 2014-12-06 ENCOUNTER — Observation Stay (HOSPITAL_COMMUNITY)
Admission: EM | Admit: 2014-12-06 | Discharge: 2014-12-08 | Disposition: A | Discharge status: Home / Self Care | Payer: Self-pay | Attending: Family Medicine | Admitting: Family Medicine

## 2014-12-06 DIAGNOSIS — I10 Essential (primary) hypertension: Secondary | ICD-10-CM

## 2014-12-06 DIAGNOSIS — I509 Heart failure, unspecified: Secondary | ICD-10-CM | POA: Insufficient documentation

## 2014-12-06 DIAGNOSIS — E119 Type 2 diabetes mellitus without complications: Secondary | ICD-10-CM | POA: Insufficient documentation

## 2014-12-06 DIAGNOSIS — J80 Acute respiratory distress syndrome: Secondary | ICD-10-CM | POA: Insufficient documentation

## 2014-12-06 DIAGNOSIS — I5022 Chronic systolic (congestive) heart failure: Secondary | ICD-10-CM

## 2014-12-06 DIAGNOSIS — K219 Gastro-esophageal reflux disease without esophagitis: Secondary | ICD-10-CM | POA: Insufficient documentation

## 2014-12-06 DIAGNOSIS — G4733 Obstructive sleep apnea (adult) (pediatric): Secondary | ICD-10-CM | POA: Insufficient documentation

## 2014-12-06 DIAGNOSIS — R0902 Hypoxemia: Secondary | ICD-10-CM | POA: Diagnosis present

## 2014-12-06 DIAGNOSIS — E785 Hyperlipidemia, unspecified: Secondary | ICD-10-CM | POA: Insufficient documentation

## 2014-12-06 DIAGNOSIS — Z6841 Body Mass Index (BMI) 40.0 and over, adult: Secondary | ICD-10-CM | POA: Insufficient documentation

## 2014-12-06 DIAGNOSIS — I429 Cardiomyopathy, unspecified: Secondary | ICD-10-CM | POA: Insufficient documentation

## 2014-12-06 DIAGNOSIS — E876 Hypokalemia: Secondary | ICD-10-CM | POA: Insufficient documentation

## 2014-12-06 DIAGNOSIS — I5023 Acute on chronic systolic (congestive) heart failure: Principal | ICD-10-CM | POA: Insufficient documentation

## 2014-12-06 DIAGNOSIS — Z7982 Long term (current) use of aspirin: Secondary | ICD-10-CM | POA: Insufficient documentation

## 2014-12-06 DIAGNOSIS — I129 Hypertensive chronic kidney disease with stage 1 through stage 4 chronic kidney disease, or unspecified chronic kidney disease: Secondary | ICD-10-CM | POA: Insufficient documentation

## 2014-12-06 DIAGNOSIS — I160 Hypertensive urgency: Secondary | ICD-10-CM | POA: Insufficient documentation

## 2014-12-06 DIAGNOSIS — N182 Chronic kidney disease, stage 2 (mild): Secondary | ICD-10-CM | POA: Insufficient documentation

## 2014-12-06 DIAGNOSIS — Z8249 Family history of ischemic heart disease and other diseases of the circulatory system: Secondary | ICD-10-CM | POA: Insufficient documentation

## 2014-12-06 DIAGNOSIS — I5021 Acute systolic (congestive) heart failure: Secondary | ICD-10-CM | POA: Insufficient documentation

## 2014-12-06 HISTORY — DX: Obstructive sleep apnea (adult) (pediatric): G47.33

## 2014-12-06 HISTORY — DX: Headache: R51

## 2014-12-06 HISTORY — DX: Type 2 diabetes mellitus without complications: E11.9

## 2014-12-06 HISTORY — DX: Hypoxemia: R09.02

## 2014-12-06 HISTORY — DX: Gastro-esophageal reflux disease without esophagitis: K21.9

## 2014-12-06 HISTORY — DX: Headache, unspecified: R51.9

## 2014-12-06 LAB — BASIC METABOLIC PANEL
Anion gap: 6 (ref 5–15)
BUN: 15 mg/dL (ref 6–20)
CALCIUM: 8.5 mg/dL — AB (ref 8.9–10.3)
CO2: 27 mmol/L (ref 22–32)
Chloride: 106 mmol/L (ref 101–111)
Creatinine, Ser: 1.28 mg/dL — ABNORMAL HIGH (ref 0.61–1.24)
GFR calc Af Amer: >60 mL/min (ref >60)
Glucose, Bld: 127 mg/dL — ABNORMAL HIGH (ref 65–99)
Potassium: 4.3 mmol/L (ref 3.5–5.1)
Sodium: 139 mmol/L (ref 135–145)

## 2014-12-06 LAB — BRAIN NATRIURETIC PEPTIDE: B Natriuretic Peptide: 397.9 pg/mL — ABNORMAL HIGH (ref 0.0–100.0)

## 2014-12-06 LAB — CBC
HEMATOCRIT: 38.1 % — AB (ref 39.0–52.0)
Hemoglobin: 12.5 g/dL — ABNORMAL LOW (ref 13.0–17.0)
MCH: 29.4 pg (ref 26.0–34.0)
MCHC: 32.8 g/dL (ref 30.0–36.0)
MCV: 89.6 fL (ref 78.0–100.0)
Platelets: 259 10*3/uL (ref 150–400)
RBC: 4.25 MIL/uL (ref 4.22–5.81)
RDW: 13.7 % (ref 11.5–15.5)
WBC: 7.9 10*3/uL (ref 4.0–10.5)

## 2014-12-06 LAB — I-STAT TROPONIN, ED: Troponin i, poc: 0.02 ng/mL (ref 0.00–0.08)

## 2014-12-06 LAB — GLUCOSE, CAPILLARY: GLUCOSE-CAPILLARY: 103 mg/dL — AB (ref 65–99)

## 2014-12-06 MED ORDER — SODIUM CHLORIDE 0.9 % IJ SOLN
3.0000 mL | Freq: Two times a day (BID) | INTRAMUSCULAR | Status: DC
  Administered 2014-12-06 – 2014-12-08 (×4): 3 mL via INTRAVENOUS

## 2014-12-06 MED ORDER — HYDRALAZINE HCL 20 MG/ML IJ SOLN
2.0000 mg | INTRAMUSCULAR | Status: DC | PRN
  Administered 2014-12-07: 2 mg via INTRAVENOUS
  Filled 2014-12-06: qty 1

## 2014-12-06 MED ORDER — HYDRALAZINE HCL 25 MG PO TABS
25.0000 mg | ORAL_TABLET | Freq: Three times a day (TID) | ORAL | Status: DC
  Administered 2014-12-06 (×2): 25 mg via ORAL
  Filled 2014-12-06 (×6): qty 1

## 2014-12-06 MED ORDER — HYDRALAZINE HCL 20 MG/ML IJ SOLN
5.0000 mg | Freq: Once | INTRAMUSCULAR | Status: AC
  Administered 2014-12-06: 5 mg via INTRAVENOUS
  Filled 2014-12-06: qty 1

## 2014-12-06 MED ORDER — HEPARIN SODIUM (PORCINE) 5000 UNIT/ML IJ SOLN
5000.0000 [IU] | Freq: Three times a day (TID) | INTRAMUSCULAR | Status: DC
  Filled 2014-12-06 (×7): qty 1

## 2014-12-06 MED ORDER — ALBUTEROL SULFATE HFA 108 (90 BASE) MCG/ACT IN AERS: 1.0000 | INHALATION_SPRAY | Freq: Four times a day (QID) | RESPIRATORY_TRACT | Status: DC | PRN

## 2014-12-06 MED ORDER — ALBUTEROL SULFATE (2.5 MG/3ML) 0.083% IN NEBU
5.0000 mg | INHALATION_SOLUTION | Freq: Once | RESPIRATORY_TRACT | Status: AC
  Administered 2014-12-06: 5 mg via RESPIRATORY_TRACT
  Filled 2014-12-06: qty 6

## 2014-12-06 MED ORDER — ZOLPIDEM TARTRATE 5 MG PO TABS
5.0000 mg | ORAL_TABLET | Freq: Once | ORAL | Status: AC
  Administered 2014-12-06: 5 mg via ORAL
  Filled 2014-12-06: qty 1

## 2014-12-06 MED ORDER — SODIUM CHLORIDE 0.9 % IV SOLN: 250.0000 mL | INTRAVENOUS | Status: DC | PRN

## 2014-12-06 MED ORDER — FUROSEMIDE 10 MG/ML IJ SOLN
40.0000 mg | Freq: Two times a day (BID) | INTRAMUSCULAR | Status: DC
  Filled 2014-12-06 (×2): qty 4

## 2014-12-06 MED ORDER — CARVEDILOL 12.5 MG PO TABS
12.5000 mg | ORAL_TABLET | Freq: Two times a day (BID) | ORAL | Status: DC
  Administered 2014-12-07 – 2014-12-08 (×4): 12.5 mg via ORAL
  Filled 2014-12-06 (×5): qty 1

## 2014-12-06 MED ORDER — SPIRONOLACTONE 25 MG PO TABS
25.0000 mg | ORAL_TABLET | Freq: Every day | ORAL | Status: DC
  Administered 2014-12-07 – 2014-12-08 (×2): 25 mg via ORAL
  Filled 2014-12-06 (×2): qty 1

## 2014-12-06 MED ORDER — SODIUM CHLORIDE 0.9 % IJ SOLN: 3.0000 mL | INTRAMUSCULAR | Status: DC | PRN

## 2014-12-06 MED ORDER — ALBUTEROL SULFATE (2.5 MG/3ML) 0.083% IN NEBU: 2.5000 mg | INHALATION_SOLUTION | Freq: Four times a day (QID) | RESPIRATORY_TRACT | Status: DC | PRN

## 2014-12-06 MED ORDER — ACETAMINOPHEN 325 MG PO TABS
650.0000 mg | ORAL_TABLET | ORAL | Status: DC | PRN
  Administered 2014-12-07: 650 mg via ORAL
  Filled 2014-12-06: qty 2

## 2014-12-06 MED ORDER — ONDANSETRON HCL 4 MG/2ML IJ SOLN: 4.0000 mg | Freq: Four times a day (QID) | INTRAMUSCULAR | Status: DC | PRN

## 2014-12-06 MED ORDER — FUROSEMIDE 8 MG/ML PO SOLN: 40.0000 mg | Freq: Once | ORAL | Status: DC

## 2014-12-06 MED ORDER — ISOSORBIDE MONONITRATE ER 30 MG PO TB24
30.0000 mg | ORAL_TABLET | Freq: Every day | ORAL | Status: DC
  Administered 2014-12-07 – 2014-12-08 (×2): 30 mg via ORAL
  Filled 2014-12-06 (×2): qty 1

## 2014-12-06 MED ORDER — ASPIRIN EC 81 MG PO TBEC
81.0000 mg | DELAYED_RELEASE_TABLET | Freq: Every day | ORAL | Status: DC
Start: 2014-12-07 — End: 2014-12-08
  Administered 2014-12-07 – 2014-12-08 (×2): 81 mg via ORAL
  Filled 2014-12-06 (×2): qty 1

## 2014-12-06 MED ORDER — LISINOPRIL 20 MG PO TABS
20.0000 mg | ORAL_TABLET | Freq: Every day | ORAL | Status: DC
  Administered 2014-12-07 – 2014-12-08 (×2): 20 mg via ORAL
  Filled 2014-12-06 (×2): qty 1

## 2014-12-06 MED ORDER — FUROSEMIDE 10 MG/ML IJ SOLN
40.0000 mg | Freq: Once | INTRAMUSCULAR | Status: AC
  Administered 2014-12-06: 40 mg via INTRAVENOUS
  Filled 2014-12-06: qty 4

## 2014-12-06 MED ORDER — PANTOPRAZOLE SODIUM 40 MG PO TBEC
40.0000 mg | DELAYED_RELEASE_TABLET | Freq: Every day | ORAL | Status: DC
  Administered 2014-12-07 – 2014-12-08 (×2): 40 mg via ORAL
  Filled 2014-12-06 (×2): qty 1

## 2014-12-06 NOTE — ED Notes (Addendum)
Pt  Reports   Shortness   Of breath       Non   Compliant  On  His  meds     Not  Taken    In  4  Days              Some  Swelling  Of  His  Feet     As   Well           Pt  Has    History  Of   chf      And  Sleep apnea          Cap  Refill is  Sluggish       Skin  Is   Warm    And  Dry   Pt  Reports  He    Has    To  Sit  Upright    To    Breath     Easier

## 2014-12-06 NOTE — ED Notes (Addendum)
O2 improved with walking. When pt stood o2 was 92%, while walking pt o2 increased to 96%. Pulse 104 pt breathing was heavy.

## 2014-12-06 NOTE — ED Notes (Signed)
O2 sat noted to be 85-87. Placed pt on 4L O2 [place] with sats currently at 96% O2.

## 2014-12-06 NOTE — ED Provider Notes (Signed)
CSN: [id number]     Arrival date & time 12/06/14  1002 History   First MD Initiated Contact with Patient 12/06/14 1004     Chief Complaint  Patient presents with  . Shortness of Breath     (Consider location/radiation/quality/duration/timing/severity/associated sxs/prior Treatment) Patient is a 39 y.o. male presenting with shortness of breath. The history is provided by the patient and medical records.  Shortness of Breath Associated symptoms: cough    39 year old male with history of hypertension, diabetes, hyperlipidemia, congestive heart failure, sleep apnea, presenting to the ED for shortness of breath. Patient states symptoms have been worse over the past 2 days. He states he feels short of breath all the time, but notably worse with exertion. He does report nighttime orthopnea. He does not endorse any significant weight gain. Patient does have lasix that he times PRN but has not taken them in approx 4 days.  Patient does report recent cough, nasal congestion but denies fever or chills. Patient was also diagnosed with sleep apnea in November 2015, but has not yet gotten a CPAP machine.  Patient denies any chest pain.  Past Medical History  Diagnosis Date  . Hypertension   . Congenital heart failure   . Diabetes   . Hyperlipemia   . CHF (congestive heart failure)   . Sleep apnea, obstructive    Past Surgical History  Procedure Laterality Date  . Appendectomy    . Abdominal surgery      tumor removed from abd. non milignant  . Left and right heart catheterization with coronary angiogram N/A 07/26/2013    Procedure: LEFT AND RIGHT HEART CATHETERIZATION WITH CORONARY ANGIOGRAM;  Surgeon: Marykay Lex, MD;  Location: [facility] CATH LAB;  Service: Cardiovascular;  Laterality: N/A;   Family History  Problem Relation Age of Onset  . Hypertension Mother   . CAD Mother   . Hypertension Father    History  Substance Use Topics  . Smoking status: Never Smoker   . Smokeless tobacco: Not on  file  . Alcohol Use: Yes     Comment: occasionally    Review of Systems  HENT: Positive for congestion.   Respiratory: Positive for cough and shortness of breath.   All other systems reviewed and are negative.     Allergies  Review of patient's allergies indicates no known allergies.  Home Medications   Prior to Admission medications   Medication Sig Start Date End Date Taking? Authorizing Provider  albuterol (PROVENTIL HFA;VENTOLIN HFA) 108 (90 BASE) MCG/ACT inhaler Inhale 1-2 puffs into the lungs every 6 (six) hours as needed for wheezing or shortness of breath. 08/22/14   April Palumbo, MD  aspirin EC 81 MG EC tablet Take 1 tablet (81 mg total) by mouth daily. 07/28/13   Esperanza Sheets, MD  carvedilol (COREG) 12.5 MG tablet Take 1 tablet (12.5 mg total) by mouth 2 (two) times daily with a meal. 12/29/13   Aundria Rud, NP  furosemide (LASIX) 40 MG tablet Take 40 mg by mouth as needed.    Historical Provider, MD  hydrALAZINE (APRESOLINE) 25 MG tablet Take 1 tablet (25 mg total) by mouth 3 (three) times daily. 01/04/14   Aundria Rud, NP  isosorbide mononitrate (IMDUR) 30 MG 24 hr tablet Take 1 tablet (30 mg total) by mouth daily. 01/04/14   Aundria Rud, NP  lisinopril (PRINIVIL,ZESTRIL) 20 MG tablet Take 1 tablet (20 mg total) by mouth daily. 12/29/13   Aundria Rud, NP  metFORMIN (  GLUCOPHAGE) 500 MG tablet Take 1 tablet (500 mg total) by mouth 2 (two) times daily with a meal. 05/12/14   Dolores Patty, MD  omeprazole (PRILOSEC) 20 MG capsule Take 1 capsule (20 mg total) by mouth daily. 08/22/14   April Palumbo, MD  polyethylene glycol powder (MIRALAX) powder Take 17 g by mouth daily. 08/22/14   April Palumbo, MD  spironolactone (ALDACTONE) 25 MG tablet Take 1 tablet (25 mg total) by mouth daily. 02/21/14   Amy D Filbert Schilder, NP  spironolactone (ALDACTONE) 25 MG tablet Take 1 tablet (25 mg total) by mouth daily. 08/22/14   April Palumbo, MD   BP 147/107 mmHg  Pulse 91  Temp(Src) 98.1  F (36.7 C) (Oral)  Resp 33  SpO2 94%   Physical Exam  Constitutional: He is oriented to person, place, and time. He appears well-developed and well-nourished.  Morbidly obese  HENT:  Head: Normocephalic and atraumatic.  Mouth/Throat: Oropharynx is clear and moist.  Eyes: Conjunctivae and EOM are normal. Pupils are equal, round, and reactive to light.  Neck: Normal range of motion.  Cardiovascular: Normal rate, regular rhythm and normal heart sounds.   Pulmonary/Chest: Effort normal and breath sounds normal. He has no wheezes. He has no rhonchi. He has no rales.  No acute distress, normal work of breathing, no accessory use or retractions, speaking in full sentences without difficulty  Abdominal: Soft. Bowel sounds are normal.  Musculoskeletal: Normal range of motion.  Neurological: He is alert and oriented to person, place, and time.  Skin: Skin is warm and dry.  Psychiatric: He has a normal mood and affect.  Nursing note and vitals reviewed.   ED Course  Procedures (including critical care time) Labs Review Labs Reviewed  CBC - Abnormal; Notable for the following:    Hemoglobin 12.5 (*)    HCT 38.1 (*)    All other components within normal limits  BASIC METABOLIC PANEL - Abnormal; Notable for the following:    Glucose, Bld 127 (*)    Creatinine, Ser 1.28 (*)    Calcium 8.5 (*)    All other components within normal limits  BRAIN NATRIURETIC PEPTIDE - Abnormal; Notable for the following:    B Natriuretic Peptide 397.9 (*)    All other components within normal limits  I-STAT TROPOININ, ED    Imaging Review Dg Chest 2 View  12/06/2014   CLINICAL DATA:  Shortness of breath.  EXAM: CHEST  2 VIEW  COMPARISON:  08/22/2014  FINDINGS: There is slight cardiomegaly with Kerley B-lines at both bases consistent with interstitial pulmonary edema. Pulmonary vascularity is normal. No effusions. No lung consolidation. No acute osseous abnormality. Slight cervicothoracic scoliosis,  unchanged.  IMPRESSION: New slight interstitial pulmonary edema.   Electronically Signed   By: Francene Boyers M.D.   On: 12/06/2014 10:39     EKG Interpretation None      MDM   Final diagnoses:  Acute exacerbation of CHF (congestive heart failure)  OSA (obstructive sleep apnea)  Chronic systolic heart failure  Obesity, morbid   39 year old male with known heart failure, here for worsening shortness of breath. He has not taken his Lasix and 4 days. He also has known sleep apnea that was diagnosed 8 months ago, however has not received his CPAP machine.  He denies chest pain.  EKG sinus rhythm without acute ischemic changes.   Labwork as above, troponin negative. BNP elevated at 397. Chest x-ray with slight pulmonary edema. Patient's O2 sats have been relatively  stable on room air. He was ambulated and able to maintain sats of 96%. I went into reassess patient and he was noted to have sats of 85%. He was placed on 4 L supplemental oxygen with improvement of his sats back to 96%. Given his new oxygen requirement, patient will require admission. He was given 40 mg Lasix IV here in the ED.  Patient admitted to family medicine teaching service.  Garlon Hatchet, PA-C 12/06/14 1435  Azalia Bilis, MD 12/06/14 1524

## 2014-12-06 NOTE — ED Provider Notes (Signed)
Vernon Reed is a 39 y.o. male who presents to Urgent Care today for shortness of breath. Patient has worsening shortness of breath last 4 days. He has a history of heart failure and has not been taking his medications regularly. He additionally has a history of sleep apnea but does not use a seatbelt machine because he cannot afford one. He denies any significant leg swelling chest pains or palpitations. He feels well otherwise. He sleeps propped up.   Past Medical History  Diagnosis Date  . Hypertension   . Congenital heart failure   . Diabetes   . Hyperlipemia   . CHF (congestive heart failure)   . Sleep apnea, obstructive    Past Surgical History  Procedure Laterality Date  . Appendectomy    . Abdominal surgery      tumor removed from abd. non milignant  . Left and right heart catheterization with coronary angiogram N/A 07/26/2013    Procedure: LEFT AND RIGHT HEART CATHETERIZATION WITH CORONARY ANGIOGRAM;  Surgeon: Marykay Lex, MD;  Location: [facility] CATH LAB;  Service: Cardiovascular;  Laterality: N/A;   History  Substance Use Topics  . Smoking status: Never Smoker   . Smokeless tobacco: Not on file  . Alcohol Use: Yes     Comment: occasionally   ROS as above Medications: No current facility-administered medications for this encounter.   Current Outpatient Prescriptions  Medication Sig Dispense Refill  . albuterol (PROVENTIL HFA;VENTOLIN HFA) 108 (90 BASE) MCG/ACT inhaler Inhale 1-2 puffs into the lungs every 6 (six) hours as needed for wheezing or shortness of breath. 1 Inhaler 0  . aspirin EC 81 MG EC tablet Take 1 tablet (81 mg total) by mouth daily. 30 tablet 2  . carvedilol (COREG) 12.5 MG tablet Take 1 tablet (12.5 mg total) by mouth 2 (two) times daily with a meal. 30 tablet 3  . furosemide (LASIX) 40 MG tablet Take 40 mg by mouth as needed.    . hydrALAZINE (APRESOLINE) 25 MG tablet Take 1 tablet (25 mg total) by mouth 3 (three) times daily. 90 tablet 6  .  isosorbide mononitrate (IMDUR) 30 MG 24 hr tablet Take 1 tablet (30 mg total) by mouth daily. 30 tablet 3  . lisinopril (PRINIVIL,ZESTRIL) 20 MG tablet Take 1 tablet (20 mg total) by mouth daily. 30 tablet 3  . metFORMIN (GLUCOPHAGE) 500 MG tablet Take 1 tablet (500 mg total) by mouth 2 (two) times daily with a meal. 60 tablet 6  . omeprazole (PRILOSEC) 20 MG capsule Take 1 capsule (20 mg total) by mouth daily. 30 capsule 0  . polyethylene glycol powder (MIRALAX) powder Take 17 g by mouth daily. 255 g 0  . spironolactone (ALDACTONE) 25 MG tablet Take 1 tablet (25 mg total) by mouth daily. 30 tablet 0  . spironolactone (ALDACTONE) 25 MG tablet Take 1 tablet (25 mg total) by mouth daily. 30 tablet 0   No Known Allergies   Exam:  BP 170/120 mmHg  Pulse 102  Temp(Src) 98.6 F (37 C) (Oral)  Resp 26  SpO2 92% room air Gen: Well NAD HEENT: EOMI,  MMM Lungs: Normal work of breathing. Bibasilar crackles Heart: Tachycardia no MRG Abd: NABS, Soft. Nondistended, Nontender Exts: Brisk capillary refill, warm and well perfused. No significant edema  ED ECG REPORT   Date: 12/06/2014  Rate: 99  Rhythm: normal sinus rhythm  QRS Axis: normal  Intervals: QT prolonged  ST/T Wave abnormalities: nonspecific T wave changes and Inverted lateral precordial T waves  Conduction Disutrbances:none  Narrative Interpretation:   Old EKG Reviewed: unchanged  I have personally reviewed the EKG tracing and agree with the computerized printout as noted.   No results found for this or any previous visit (from the past 24 hour(s)). No results found.  Assessment and Plan: 39 y.o. male with heart failure exacerbation likely due to medication and CPAP noncompliance. Transfer to emergency department for further evaluation and management.  Discussed warning signs or symptoms. Please see discharge instructions. Patient expresses understanding.     Rodolph Bong, MD 12/06/14 [phone]

## 2014-12-06 NOTE — H&P (Signed)
Family Medicine Teaching [facility] Admission History and Physical Service Pager: [phone]  Patient name: Vernon Reed Medical record number: [id number] Date of birth: 10-09-1975 Age: 39 y.o. Gender: male  Primary Care Provider: No PCP Per Patient Consultants: None Code Status: FULL per discussion on admission  Chief Complaint: SOB  Assessment and Plan: Vernon Reed is a 39 y.o. male presenting with SOB with hypoxia. PMH is significant for HFrEF, OSA,   Acute respiratory distress: Most likely secondary to CHF exacerbation and worsening OSA given BNP elevated to 397, CXR with slight pulmonary edema, mild crackles, and pitting edema on exam. CXR not consistent with infection, no URI symptoms, no fever or leukocytosis. EKG unchanged from previous and istat troponin negative without chest pain or associated symptoms. Patient s/p Lasix  IV and an albuterol treatment in the ED. Was noted to drop to 85% at rest on RA and then required 4L on [place]. Patient breathing comfortably and was satting well when  was discontinued. - Admit to telemetry, attending Dr. Jennette Kettle  - Lasix  IV  BID  - Daily weights and strict I&Os  - CPAP at night, will attempt to start the process to obtain a CPAP for home use (although it appears his pulmonologist started the process on 5/31).   Congestive systolic heart failure: Last echo 12/28/13 with EF 45-50% with lateral wall hypokinesis and grade 1 diastolic dysfunction. He was last seen by Heart Failure clinic on 03/07/2014, at that time it was noted that his discharge weight from his previous hospitalization was 310lbs.  - NYHA I. Appears volume overloaded on exam and weight is up (although unsure what his true dry weight is).  - Continue coreg 12.5mg  BID - Continue lisinopril  QD - Continue hydralazine  TID adn Imdur  daily  - Holding home lasix  PO PRN and doing Lasix  IV BID for now. - Stressed the importance of low sodium diet, fluid  restriction <2L.  HTN: Blood pressures were well controlled at his last appt in 2015, however grossly elevated at urgent care and in the ED. Repeat manual BP consistent with automated readings.  Reports compliance with home medications.  - Continue coreg, lisinopril, hydralazine as above - Hydralazine  IV q4hrs PRN BP >180/110 - No signs of end organ damage due to elevated BPs, however will continue to monitor for symptoms and labs. - Continue to monitor  CKD stage 2: currently around baseline creatinine. - avoid nephrotoxic agents - will need monitoring as outpatient, especially considering diagnosis of diabetes  Diabetes mellitus, type II: Last A1c was 6.7 on 12/27/13. - Repeat A1c  - Holding home metformin - Monitor CBGs q 6hr, if elevated, will start sensitive SSI  - Given patient's DM, HTN, obesity, and ethnicity, he will most likely need a high intensity statin, will obtain lipid panel in AM.  FEN/GI: Heart healthy/carb modified Prophylaxis: SQ heparin  Disposition: Telemetry pending improvement in SOB and BP  History of Present Illness: Vernon Reed is a 39 y.o. male presenting with SOB. Has has noted being SOB for several months now in the AM, however over the last few days, he's noted it is more severe than usual in the AM and worse while awake. He denies that exertion makes it worse. He has associated intermittent, productive coughing with yellow sputum. No associated fever, chills, chest pain, diaphoresis. No sick contacts. He has two pillow orthopnea which may be a little increased from baseline (he used to have 1 pillow that he'd fold  over). Also endorses PND that has worsened over the last few days. Was [place] the beach over the weekend and feels he hasn't been compliant with salt/fluid restriction.    In the ED, a CXR revealed mild pulmonary edema. He received Lasix  IV and albuterol (no wheezing, however per report to help open the airways). He was going to be discharged  home however then noted to have a new O2 requirement of 4L to maintain O2>90%.   Review Of Systems: Per HPI with the following additions: None Otherwise 12 point review of systems was performed and was unremarkable.  Patient Active Problem List   Diagnosis Date Noted  . CHF (congestive heart failure) 12/06/2014  . OSA (obstructive sleep apnea) 01/10/2014  . Chronic systolic heart failure 01/04/2014  . Obesity, morbid 12/27/2013  . AKI (acute kidney injury) 07/27/2013  . Tachy-brady syndrome 07/26/2013  . Hypokalemia 07/26/2013  . Cardiomyopathy 07/25/2013  . Heart block 07/25/2013  . Tachycardia 07/23/2013  . Hypertension 07/23/2013  . Type II or unspecified type diabetes mellitus without mention of complication, not stated as uncontrolled 07/23/2013   Past Medical History: Past Medical History  Diagnosis Date  . Hypertension   . Congenital heart failure   . Diabetes   . Hyperlipemia   . CHF (congestive heart failure)   . Sleep apnea, obstructive    Past Surgical History: Past Surgical History  Procedure Laterality Date  . Appendectomy    . Abdominal surgery      tumor removed from abd. non milignant  . Left and right heart catheterization with coronary angiogram N/A 07/26/2013    Procedure: LEFT AND RIGHT HEART CATHETERIZATION WITH CORONARY ANGIOGRAM;  Surgeon: Marykay Lex, MD;  Location: [facility] CATH LAB;  Service: Cardiovascular;  Laterality: N/A;   Social History: History  Substance Use Topics  . Smoking status: Never Smoker   . Smokeless tobacco: Not on file  . Alcohol Use: Yes     Comment: occasionally   Additional social history: Not a smoker, social alcohol use (not daily)  Please also refer to relevant sections of EMR.  Family History: Family History  Problem Relation Age of Onset  . Hypertension Mother   . CAD Mother   . Hypertension Father    Allergies and Medications: No Known Allergies No current facility-administered medications on file prior to  encounter.   Current Outpatient Prescriptions on File Prior to Encounter  Medication Sig Dispense Refill  . albuterol (PROVENTIL HFA;VENTOLIN HFA) 108 (90 BASE) MCG/ACT inhaler Inhale 1-2 puffs into the lungs every 6 (six) hours as needed for wheezing or shortness of breath. 1 Inhaler 0  . aspirin EC 81 MG EC tablet Take 1 tablet (81 mg total) by mouth daily. 30 tablet 2  . carvedilol (COREG) 12.5 MG tablet Take 1 tablet (12.5 mg total) by mouth 2 (two) times daily with a meal. 30 tablet 3  . furosemide (LASIX) 40 MG tablet Take 40 mg by mouth as needed for fluid.     . hydrALAZINE (APRESOLINE) 25 MG tablet Take 1 tablet (25 mg total) by mouth 3 (three) times daily. 90 tablet 6  . isosorbide mononitrate (IMDUR) 30 MG 24 hr tablet Take 1 tablet (30 mg total) by mouth daily. 30 tablet 3  . lisinopril (PRINIVIL,ZESTRIL) 20 MG tablet Take 1 tablet (20 mg total) by mouth daily. 30 tablet 3  . metFORMIN (GLUCOPHAGE) 500 MG tablet Take 1 tablet (500 mg total) by mouth 2 (two) times daily with a meal.  60 tablet 6  . omeprazole (PRILOSEC) 20 MG capsule Take 1 capsule (20 mg total) by mouth daily. 30 capsule 0  . polyethylene glycol powder (MIRALAX) powder Take 17 g by mouth daily. (Patient taking differently: Take 17 g by mouth as needed for mild constipation. ) 255 g 0  . spironolactone (ALDACTONE) 25 MG tablet Take 1 tablet (25 mg total) by mouth daily. 30 tablet 0    Objective: BP 147/107 mmHg  Pulse 91  Temp(Src) 98.1 F (36.7 C) (Oral)  Resp 33  SpO2 94% Exam: General: Lying in bed watching TV, resting comfortably with significant other at bedside. Eyes: Conjunctivae non-injected. PERRL. ENTM: MMM, Oropharynx clear. Moist nasal turbinates.  Neck: No LAD.  Cardiovascular: RRR, no m/r/g noted. 1+ pitting edema 3/4 up anterior shin. No JVD Respiratory: No increased WOB. RR 25-31 while in the room. Mild crackles in the left lung base. Was satting 95% on RA when [place] was removed and continued to  be comfortable.  Abdomen: +BS, well healed appendectomy scar. Soft, ND/NT MSK: No gross deformities    Skin: No lesions/rashes noted.   Neuro: A&Ox4. No gross neurologic deficits notes. Psych: Appropriate mood and affect.  Labs and Imaging: CBC BMET   Recent Labs Lab 12/06/14 1027  WBC 7.9  HGB 12.5*  HCT 38.1*  PLT 259    Recent Labs Lab 12/06/14 1027  NA 139  K 4.3  CL 106  CO2 27  BUN 15  CREATININE 1.28*  GLUCOSE 127*  CALCIUM 8.5*     CXR: New slight interstitial pulmonary edema.  EKG: NSR, HR 95. Twaves inversions in V5, V6, III, aVFand flattening in aVR, all present previously. QTC noted to be prolonged to 675.  Joanna Puff, MD 12/06/2014, 2:41 PM PGY-1, [facility] FPTS Intern pager: [phone], text pages welcome   I have seen and examined the patient. I have read and agree with the above note. My changes are noted in blue.  Jacquelin Hawking, MD PGY-2, [facility] 12/06/2014, 9:26 PM

## 2014-12-06 NOTE — ED Notes (Signed)
Pt arrives via POV transferred from urgent care for suspected heart failure. REports SOB for the last 2 days. Tried inhaler and prn lasix with little relief of sysmptoms. Pt reports unable to get cpap machine, prescribed last Nov. PT with clear lungs sounds, 2+ dependent edema, VSS at present.

## 2014-12-06 NOTE — ED Notes (Signed)
MD Jennette Kettle notified of floor RN request of PRN BP medication. Jennette Kettle stated to page Resident [phone]. Resident was paged 10 min ago with no response yet. Will admin scheduled Hydralazine until response from resident.

## 2014-12-06 NOTE — ED Notes (Signed)
Attempted to give report. Floor nurse states she wants a PRN med to lower diastolic BP before they will accept.

## 2014-12-06 NOTE — Progress Notes (Addendum)
Pt. Requesting ambien for sleep. Text page sent to on call for family medicine. RN will continue to monitor pt. Cherina Dhillon, Cheryll Dessert

## 2014-12-06 NOTE — ED Notes (Signed)
Hospitalist at bedside 

## 2014-12-07 ENCOUNTER — Inpatient Hospital Stay (HOSPITAL_COMMUNITY): Payer: Self-pay

## 2014-12-07 DIAGNOSIS — R0902 Hypoxemia: Secondary | ICD-10-CM

## 2014-12-07 DIAGNOSIS — I160 Hypertensive urgency: Secondary | ICD-10-CM | POA: Insufficient documentation

## 2014-12-07 LAB — GLUCOSE, CAPILLARY
GLUCOSE-CAPILLARY: 81 mg/dL (ref 65–99)
Glucose-Capillary: 93 mg/dL (ref 65–99)
Glucose-Capillary: 82 mg/dL (ref 65–99)
Glucose-Capillary: 83 mg/dL (ref 65–99)

## 2014-12-07 LAB — BASIC METABOLIC PANEL
ANION GAP: 9 (ref 5–15)
BUN: 16 mg/dL (ref 6–20)
CALCIUM: 8.7 mg/dL — AB (ref 8.9–10.3)
CO2: 30 mmol/L (ref 22–32)
CREATININE: 1.4 mg/dL — AB (ref 0.61–1.24)
Chloride: 101 mmol/L (ref 101–111)
Glucose, Bld: 105 mg/dL — ABNORMAL HIGH (ref 65–99)
POTASSIUM: 3.6 mmol/L (ref 3.5–5.1)
SODIUM: 140 mmol/L (ref 135–145)

## 2014-12-07 LAB — LIPID PANEL
CHOL/HDL RATIO: 7.5 ratio
Cholesterol: 165 mg/dL (ref 0–200)
HDL: 22 mg/dL — AB (ref >40)
LDL Cholesterol: 116 mg/dL — ABNORMAL HIGH (ref 0–99)
TRIGLYCERIDES: 134 mg/dL (ref <150)
VLDL: 27 mg/dL (ref 0–40)

## 2014-12-07 MED ORDER — ZOLPIDEM TARTRATE 5 MG PO TABS
5.0000 mg | ORAL_TABLET | Freq: Once | ORAL | Status: AC
  Administered 2014-12-08: 5 mg via ORAL
  Filled 2014-12-07: qty 1

## 2014-12-07 MED ORDER — HYDRALAZINE HCL 50 MG PO TABS
50.0000 mg | ORAL_TABLET | Freq: Four times a day (QID) | ORAL | Status: DC
  Administered 2014-12-07 – 2014-12-08 (×4): 50 mg via ORAL
  Filled 2014-12-07 (×7): qty 1

## 2014-12-07 MED ORDER — ATORVASTATIN CALCIUM 40 MG PO TABS
40.0000 mg | ORAL_TABLET | Freq: Every day | ORAL | Status: DC
  Administered 2014-12-07: 40 mg via ORAL
  Filled 2014-12-07 (×2): qty 1

## 2014-12-07 MED ORDER — HYDRALAZINE HCL 25 MG PO TABS
25.0000 mg | ORAL_TABLET | Freq: Four times a day (QID) | ORAL | Status: DC
  Administered 2014-12-07: 25 mg via ORAL
  Filled 2014-12-07 (×4): qty 1

## 2014-12-07 MED ORDER — POTASSIUM CHLORIDE CRYS ER 20 MEQ PO TBCR
40.0000 meq | EXTENDED_RELEASE_TABLET | Freq: Once | ORAL | Status: AC
  Administered 2014-12-07: 40 meq via ORAL
  Filled 2014-12-07: qty 2

## 2014-12-07 NOTE — Progress Notes (Signed)
RN notified by CCMD that pt. HR dropped down to the 30's non-sustained. Pt. Resting in bed quietly. No s/s if distress or discomfort noted. On-coming RN notified. Nasario Czerniak, Cheryll Dessert

## 2014-12-07 NOTE — Progress Notes (Signed)
CM CONSULT FOR CPAP Talked to patient about sleep study, patient stated that he had a sleep study done 05/2014, the prescription was sent to [facility] to see if he qualified for any charity funds but patient stated that he never heard anything from them. TCT Jermaine with [facility] for CPAP, unsure if he will get the CPAP during this hospitalization, [facility] to see if he qualifies for charity funds- they will contact the patient for further information; Alexis Goodell [phone]

## 2014-12-07 NOTE — Progress Notes (Signed)
Family Medicine Teaching Service Daily Progress Note Intern Pager: [phone]  Patient name: Vernon Reed Medical record number: [id number] Date of birth: August 16, 1975 Age: 39 y.o. Gender: male  Primary Care Provider: No PCP Per Patient Consultants: None  Code Status: FULL per discussion on admission  Pt Overview and Major Events to Date:  6/1- admitted for SOB, mostly in the AM but becoming more frequent. BPs also elevated to 140s/110s.   Assessment and Plan: Vernon Reed is a 39 y.o. male presenting with SOB with hypoxia and hypertensive urgency. PMH is significant for HFrEF, OSA, systolic CHF, HTN, T2DM, CKD 2.   Acute respiratory distress: Most likely secondary to CHF exacerbation and worsening OSA given BNP elevated to 397, CXR with slight pulmonary edema, mild crackles, and pitting edema on exam, and symptoms worse in AM (with increased PND frequency). CXR not consistent with infection, no URI symptoms, no fever or leukocytosis. EKG unchanged from previous and istat troponin negative without chest pain or associated symptoms. Patient s/p Lasix  IV and an albuterol treatment in the ED. Was noted to drop to 85% at rest on RA and then required 4L on [place]. Patient breathing comfortably and was satting well when [place] was discontinued and continues to sat 92-96% on RA. Approximately 1 year ago, it looks as though his dry weight was 310, however he's been below that since admission and he does not regularly weigh himself. - Continue on telemetry - Lasix  IV BID > consider discontinuing after AM dose - Daily weights > 302 >301 - Strict I&Os > -2.6L O/N - Echocardiogram ordered - CPAP at night, will attempt to start the process to obtain a CPAP for home use (although it appears his pulmonologist started the process on 5/31).   Congestive systolic heart failure: Last echo 12/28/13 with EF 45-50% with lateral wall hypokinesis and grade 1 diastolic dysfunction. He was last seen by Heart  Failure clinic on 03/07/2014, at that time it was noted that his discharge weight from his previous hospitalization was 310lbs.  - NYHA I. Appears volume overloaded on exam (however reported weight of 312 not consistent with weights obtained here of 302, 301) - Continue coreg 12.5mg  BID - Continue lisinopril  QD - Continue hydralazine  (increased to QID) and Imdur  daily  - Held Lasix  IV BID (as pt not fluid overloaded) - Stressed the importance of low sodium diet, fluid restriction <2L.  Hypertensive urgency: Blood pressures were well controlled at his last appt in 2015, however grossly elevated O/N with 150s-170s/90s-120s. Reports compliance with home medications, however BP this AM wnl at 139/89 (however received 1 PRN of hydralazine) - Continue coreg, lisinopril as above - Will increase hydralazine to QID instead of TID - watch out for HR as tachy/brady on pt's problem list and he was noted to have a HR 38 O/N on telemetry  - Hydralazine  IV q4hrs PRN BP >180/110 - No signs of end organ damage due to elevated BPs, however will continue to monitor for symptoms and labs. - Continue to monitor  CKD stage 2: currently around baseline creatinine (1.2-1.6) - SCr increased from 1.28 >1.4, most likely from diuresis - avoid nephrotoxic agents - will need monitoring as outpatient, especially considering diagnosis of diabetes  Diabetes mellitus, type II: Last A1c was 6.7 on 12/27/13. - Repeat A1c pending - Holding home metformin - Monitor CBGs q 6hr, if elevated, will start sensitive SSI (80s-100s now).  - Given patient's DM, HTN, obesity, and ethnicity, he  will most likely need a high intensity statin, will obtain lipid panel in AM.  FEN/GI: Heart healthy/carb modified Prophylaxis: SQ heparin  Disposition: pending improvement in BPs   Subjective:  No CP or SOB. No diaphoresis. Slept well w/o PND on CPAP.   Objective: Temp:  [98.1 F (36.7 C)-98.9 F (37.2 C)] 98.1  F (36.7 C) (06/02 0500) Pulse Rate:  [84-102] 84 (06/02 0500) Resp:  [12-33] 20 (06/02 0500) BP: (112-170)/(81-129) 139/89 mmHg (06/02 0500) SpO2:  [90 %-100 %] 94 % (06/02 0500) Weight:  [301 lb 12.8 oz (136.896 kg)-302 lb 6.4 oz (137.168 kg)] 301 lb 12.8 oz (136.896 kg) (06/02 0500) Physical Exam: General: sitting up in bed in NAD Cardiovascular: RRR, no m/r/g noted. No JVD. No pitting edema noted.  Respiratory: No increased WOB. CTAB without wheezing, rhonchi, or crackles noted. Currently on RA Abdomen: Soft, ND/NT Extremities: No gross deformities  Laboratory:  Recent Labs Lab 12/06/14 1027  WBC 7.9  HGB 12.5*  HCT 38.1*  PLT 259    Recent Labs Lab 12/06/14 1027  NA 139  K 4.3  CL 106  CO2 27  BUN 15  CREATININE 1.28*  CALCIUM 8.5*  GLUCOSE 127*   CXR: New slight interstitial pulmonary edema.  EKG: NSR, HR 95. Twaves inversions in V5, V6, III, aVFand flattening in aVR, all present previously. QTC noted to be prolonged to 675.  EKG: NSR, HR 79. T wave inversions in V4 (new), V5, V6, III, and aVF with flattening in aVR. QTc improved to 48. Possible incomplete LBBB.   Joanna Puff, MD 12/07/2014, 6:49 AM PGY-1, [facility] FPTS Intern pager: [phone], text pages welcome

## 2014-12-07 NOTE — Progress Notes (Signed)
Heart Failure Navigator Consult Note  Presentation: Vernon Reed is a 39 y.o. male presenting with SOB. Has has noted being SOB for several months now in the AM, however over the last few days, he's noted it is more severe than usual in the AM and worse while awake. He denies that exertion makes it worse. He has associated intermittent, productive coughing with yellow sputum. No associated fever, chills, chest pain, diaphoresis. No sick contacts. He has two pillow orthopnea which may be a little increased from baseline (he used to have 1 pillow that he'd fold over). Also endorses PND that has worsened over the last few days. Was [place] the beach over the weekend and feels he hasn't been compliant with salt/fluid restriction.   Past Medical History  Diagnosis Date  . Hypertension   . Hyperlipemia   . CHF (congestive heart failure)   . OSA (obstructive sleep apnea)     "should wear mask but I don't have one" (12/06/2014)  . Headache     "maybe 5 times/yr; nothing regular" (12/06/2014)  . GERD (gastroesophageal reflux disease)   . Type II diabetes mellitus     History   Social History  . Marital Status: Divorced    Spouse Name: N/A  . Number of Children: 3  . Years of Education: N/A   Occupational History  . Emergency planning/management officer    Social History Main Topics  . Smoking status: Never Smoker   . Smokeless tobacco: Never Used  . Alcohol Use: Yes     Comment: 12/06/2014 "might have a beer a couple times/month"  . Drug Use: No  . Sexual Activity: Yes   Other Topics Concern  . None   Social History Narrative    ECHO:Study Conclusions--12/28/13  - Left ventricle: The cavity size was normal. There was moderate concentric hypertrophy. Systolic function was mildly reduced. The estimated ejection fraction was in the range of 45% to 50%. Lateral wall hypokinesis. Doppler parameters are consistent with abnormal left ventricular relaxation (grade 1 diastolic dysfunction). The E/e&'  ratio is <8, suggesting normal LV filling pressure. - Aortic valve: Sclerosis without stenosis. There was trivial regurgitation. - Left atrium: The atrium was normal in size.  Impressions:  - Compared to the echo in 07/2013, EF has improved significantly. LV filling pressures do not appear elevated. There is relative lateral wall hypokinesis.  BNP    Component Value Date/Time   BNP 397.9* 12/06/2014 1027    ProBNP    Component Value Date/Time   PROBNP 688.2* 12/27/2013 0650     Education Assessment and Provision:  Detailed education and instructions provided on heart failure disease management including the following:  Signs and symptoms of Heart Failure When to call the physician Importance of daily weights Low sodium diet Fluid restriction Medication management Anticipated future follow-up appointments  Patient education given on each of the above topics.  Patient acknowledges understanding and acceptance of all instructions.  I know Mr. Engram from previous admission.  He admits that he was not taking Lasix as prescribed at home and has not been weighing daily.  Of note-- the Lasix is prescribed as needed for fluid.Marland KitchenMarland KitchenI explained that daily weights are very important to assess his signs and symptoms of HF.  He also admits that he is "probably eating the wrong things".   I again reviewed a low sodium diet and high sodium foods to avoid.  He understands that he will need to change his habits in order to benefit his health.  [facility].  I encouraged him to call me with questions after discharge if needed.    Education Materials:  "Living Better With Heart Failure" Booklet, Daily Weight Tracker Tool    High Risk Criteria for Readmission and/or Poor Patient Outcomes:   EF <30%- 45-50% with grade 1 dias dys  2 or more admissions in 6 months- No  Difficult social situation- No  Demonstrates medication noncompliance- yes--admits to not taking  Lasix as directed   Barriers of Care:  Knowledge and compliance  Discharge Planning:   Plans to discharge to home with a roommate

## 2014-12-07 NOTE — Progress Notes (Signed)
Pt states he can place himself on CPAP when ready. RT instructed patient to call if he needed any help.

## 2014-12-08 ENCOUNTER — Observation Stay (HOSPITAL_BASED_OUTPATIENT_CLINIC_OR_DEPARTMENT_OTHER): Payer: Self-pay

## 2014-12-08 DIAGNOSIS — I509 Heart failure, unspecified: Secondary | ICD-10-CM

## 2014-12-08 DIAGNOSIS — R0600 Dyspnea, unspecified: Secondary | ICD-10-CM

## 2014-12-08 LAB — BASIC METABOLIC PANEL
Anion gap: 8 (ref 5–15)
BUN: 20 mg/dL (ref 6–20)
CHLORIDE: 103 mmol/L (ref 101–111)
CO2: 27 mmol/L (ref 22–32)
Calcium: 8.7 mg/dL — ABNORMAL LOW (ref 8.9–10.3)
Creatinine, Ser: 1.26 mg/dL — ABNORMAL HIGH (ref 0.61–1.24)
GFR calc non Af Amer: >60 mL/min (ref >60)
Glucose, Bld: 96 mg/dL (ref 65–99)
Potassium: 3.7 mmol/L (ref 3.5–5.1)
Sodium: 138 mmol/L (ref 135–145)

## 2014-12-08 LAB — GLUCOSE, CAPILLARY
GLUCOSE-CAPILLARY: 76 mg/dL (ref 65–99)
Glucose-Capillary: 96 mg/dL (ref 65–99)
Glucose-Capillary: 83 mg/dL (ref 65–99)

## 2014-12-08 LAB — HEMOGLOBIN A1C
Hgb A1c MFr Bld: 6.1 % — ABNORMAL HIGH (ref 4.8–5.6)
Mean Plasma Glucose: 128 mg/dL

## 2014-12-08 MED ORDER — POTASSIUM CHLORIDE ER 10 MEQ PO TBCR: 10.0000 meq | EXTENDED_RELEASE_TABLET | Freq: Every day | ORAL | Status: DC

## 2014-12-08 MED ORDER — PRAVASTATIN SODIUM 80 MG PO TABS: 80.0000 mg | ORAL_TABLET | Freq: Every day | ORAL | Status: DC

## 2014-12-08 MED ORDER — FUROSEMIDE 40 MG PO TABS: 40.0000 mg | ORAL_TABLET | Freq: Every day | ORAL | Status: DC

## 2014-12-08 MED ORDER — HYDRALAZINE HCL 50 MG PO TABS: 50.0000 mg | ORAL_TABLET | Freq: Four times a day (QID) | ORAL | Status: DC

## 2014-12-08 MED ORDER — FUROSEMIDE 40 MG PO TABS
40.0000 mg | ORAL_TABLET | Freq: Once | ORAL | Status: AC
  Administered 2014-12-08: 40 mg via ORAL
  Filled 2014-12-08: qty 1

## 2014-12-08 NOTE — Progress Notes (Signed)
Reviewed all d/c instructions, including patient education for heart failure (diet, salt and fluid restriction, weighing self daily, taking meds as ordered, and keeping MD appts).  Stated awareness that prescriptions to be picked up at [facility] per d/c sheet information.  No voiced complaints.  CCMD called when telemetry d/c'd.

## 2014-12-08 NOTE — Discharge Instructions (Signed)
Your new dry weight is 301.  Please start taking Lasix on a daily basis (as it appears that you've needed it daily while you're here).  While you are taking Lasix daily, I have prescribed a potassium tablet to make sure that stays at a good level.  We have increased hydralazine from  3 times a day to  4 times a day. I have started a medication to lower your cholesterol called Pravastatin that you should take daily- you can get a 90 day supply at [facility] for $16.  Heart Failure Heart failure is a condition in which the heart has trouble pumping blood. This means your heart does not pump blood efficiently for your body to work well. In some cases of heart failure, fluid may back up into your lungs or you may have swelling (edema) in your lower legs. Heart failure is usually a long-term (chronic) condition. It is important for you to take good care of yourself and follow your health care provider's treatment plan. CAUSES  Some health conditions can cause heart failure. Those health conditions include:  High blood pressure (hypertension). Hypertension causes the heart muscle to work harder than normal. When pressure in the blood vessels is high, the heart needs to pump (contract) with more force in order to circulate blood throughout the body. High blood pressure eventually causes the heart to become stiff and weak.  Coronary artery disease (CAD). CAD is the buildup of cholesterol and fat (plaque) in the arteries of the heart. The blockage in the arteries deprives the heart muscle of oxygen and blood. This can cause chest pain and may lead to a heart attack. High blood pressure can also contribute to CAD.  Heart attack (myocardial infarction). A heart attack occurs when one or more arteries in the heart become blocked. The loss of oxygen damages the muscle tissue of the heart. When this happens, part of the heart muscle dies. The injured tissue does not contract as well and weakens the heart's  ability to pump blood.  Abnormal heart valves. When the heart valves do not open and close properly, it can cause heart failure. This makes the heart muscle pump harder to keep the blood flowing.  Heart muscle disease (cardiomyopathy or myocarditis). Heart muscle disease is damage to the heart muscle from a variety of causes. These can include drug or alcohol abuse, infections, or unknown reasons. These can increase the risk of heart failure.  Lung disease. Lung disease makes the heart work harder because the lungs do not work properly. This can cause a strain on the heart, leading it to fail.  Diabetes. Diabetes increases the risk of heart failure. High blood sugar contributes to high fat (lipid) levels in the blood. Diabetes can also cause slow damage to tiny blood vessels that carry important nutrients to the heart muscle. When the heart does not get enough oxygen and food, it can cause the heart to become weak and stiff. This leads to a heart that does not contract efficiently.  Other conditions can contribute to heart failure. These include abnormal heart rhythms, thyroid problems, and low blood counts (anemia). Certain unhealthy behaviors can increase the risk of heart failure, including:  Being overweight.  Smoking or chewing tobacco.  Eating foods high in fat and cholesterol.  Abusing illicit drugs or alcohol.  Lacking physical activity. SYMPTOMS  Heart failure symptoms may vary and can be hard to detect. Symptoms may include:  Shortness of breath with activity, such as climbing stairs.  Persistent cough.  Swelling of the feet, ankles, legs, or abdomen.  Unexplained weight gain.  Difficulty breathing when lying flat (orthopnea).  Waking from sleep because of the need to sit up and get more air.  Rapid heartbeat.  Fatigue and loss of energy.  Feeling light-headed, dizzy, or close to fainting.  Loss of appetite.  Nausea.  Increased urination during the night  (nocturia). DIAGNOSIS  A diagnosis of heart failure is based on your history, symptoms, physical examination, and diagnostic tests. Diagnostic tests for heart failure may include:  Echocardiography.  Electrocardiography.  Chest X-ray.  Blood tests.  Exercise stress test.  Cardiac angiography.  Radionuclide scans. TREATMENT  Treatment is aimed at managing the symptoms of heart failure. Medicines, behavioral changes, or surgical intervention may be necessary to treat heart failure.  Medicines to help treat heart failure may include:  Angiotensin-converting enzyme (ACE) inhibitors. This type of medicine blocks the effects of a blood protein called angiotensin-converting enzyme. ACE inhibitors relax (dilate) the blood vessels and help lower blood pressure.  Angiotensin receptor blockers (ARBs). This type of medicine blocks the actions of a blood protein called angiotensin. Angiotensin receptor blockers dilate the blood vessels and help lower blood pressure.  Water pills (diuretics). Diuretics cause the kidneys to remove salt and water from the blood. The extra fluid is removed through urination. This loss of extra fluid lowers the volume of blood the heart pumps.  Beta blockers. These prevent the heart from beating too fast and improve heart muscle strength.  Digitalis. This increases the force of the heartbeat.  Healthy behavior changes include:  Obtaining and maintaining a healthy weight.  Stopping smoking or chewing tobacco.  Eating heart-healthy foods.  Limiting or avoiding alcohol.  Stopping illicit drug use.  Physical activity as directed by your health care provider.  Surgical treatment for heart failure may include:  A procedure to open blocked arteries, repair damaged heart valves, or remove damaged heart muscle tissue.  A pacemaker to improve heart muscle function and control certain abnormal heart rhythms.  An internal cardioverter defibrillator to treat  certain serious abnormal heart rhythms.  A left ventricular assist device (LVAD) to assist the pumping ability of the heart. HOME CARE INSTRUCTIONS   Take medicines only as directed by your health care provider. Medicines are important in reducing the workload of your heart, slowing the progression of heart failure, and improving your symptoms.  Do not stop taking your medicine unless directed by your health care provider.  Do not skip any dose of medicine.  Refill your prescriptions before you run out of medicine. Your medicines are needed every day.  Engage in moderate physical activity if directed by your health care provider. Moderate physical activity can benefit some people. The elderly and people with severe heart failure should consult with a health care provider for physical activity recommendations.  Eat heart-healthy foods. Food choices should be free of trans fat and low in saturated fat, cholesterol, and salt (sodium). Healthy choices include fresh or frozen fruits and vegetables, fish, lean meats, legumes, fat-free or low-fat dairy products, and whole grain or high fiber foods. Talk to a dietitian to learn more about heart-healthy foods.  Limit sodium if directed by your health care provider. Sodium restriction may reduce symptoms of heart failure in some people. Talk to a dietitian to learn more about heart-healthy seasonings.  Use healthy cooking methods. Healthy cooking methods include roasting, grilling, broiling, baking, poaching, steaming, or stir-frying. Talk to a dietitian to learn  more about healthy cooking methods.  Limit fluids if directed by your health care provider. Fluid restriction may reduce symptoms of heart failure in some people.  Weigh yourself every day. Daily weights are important in the early recognition of excess fluid. You should weigh yourself every morning after you urinate and before you eat breakfast. Wear the same amount of clothing each time you  weigh yourself. Record your daily weight. Provide your health care provider with your weight record.  Monitor and record your blood pressure if directed by your health care provider.  Check your pulse if directed by your health care provider.  Lose weight if directed by your health care provider. Weight loss may reduce symptoms of heart failure in some people.  Stop smoking or chewing tobacco. Nicotine makes your heart work harder by causing your blood vessels to constrict. Do not use nicotine gum or patches before talking to your health care provider.  Keep all follow-up visits as directed by your health care provider. This is important.  Limit alcohol intake to no more than 1 drink per day for nonpregnant women and 2 drinks per day for men. One drink equals 12 ounces of beer, 5 ounces of wine, or 1 ounces of hard liquor. Drinking more than that is harmful to your heart. Tell your health care provider if you drink alcohol several times a week. Talk with your health care provider about whether alcohol is safe for you. If your heart has already been damaged by alcohol or you have severe heart failure, drinking alcohol should be stopped completely.  Stop illicit drug use.  Stay up-to-date with immunizations. It is especially important to prevent respiratory infections through current pneumococcal and influenza immunizations.  Manage other health conditions such as hypertension, diabetes, thyroid disease, or abnormal heart rhythms as directed by your health care provider.  Learn to manage stress.  Plan rest periods when fatigued.  Learn strategies to manage high temperatures. If the weather is extremely hot:  Avoid vigorous physical activity.  Use air conditioning or fans or seek a cooler location.  Avoid caffeine and alcohol.  Wear loose-fitting, lightweight, and light-colored clothing.  Learn strategies to manage cold temperatures. If the weather is extremely cold:  Avoid vigorous  physical activity.  Layer clothes.  Wear mittens or gloves, a hat, and a scarf when going outside.  Avoid alcohol.  Obtain ongoing education and support as needed.  Participate in or seek rehabilitation as needed to maintain or improve independence and quality of life. SEEK MEDICAL CARE IF:   Your weight increases by 03 lb/1.4 kg in 1 day or 05 lb/2.3 kg in a week.  You have increasing shortness of breath that is unusual for you.  You are unable to participate in your usual physical activities.  You tire easily.  You cough more than normal, especially with physical activity.  You have any or more swelling in areas such as your hands, feet, ankles, or abdomen.  You are unable to sleep because it is hard to breathe.  You feel like your heart is beating fast (palpitations).  You become dizzy or light-headed upon standing up. SEEK IMMEDIATE MEDICAL CARE IF:   You have difficulty breathing.  There is a change in mental status such as decreased alertness or difficulty with concentration.  You have a pain or discomfort in your chest.  You have an episode of fainting (syncope). MAKE SURE YOU:   Understand these instructions.  Will watch your condition.  Will get  help right away if you are not doing well or get worse. Document Released: 06/23/2005 Document Revised: 11/07/2013 Document Reviewed: 07/23/2012 [facility] Patient Information 2015 [place], [place]. This information is not intended to replace advice given to you by your health care provider. Make sure you discuss any questions you have with your health care provider.

## 2014-12-08 NOTE — Discharge Summary (Signed)
Family Medicine [facility] Discharge Summary  Patient name: Vernon Reed Medical record number: [id number] Date of birth: 1975-09-07 Age: 39 y.o. Gender: male Date of Admission: 12/06/2014  Date of Discharge: 12/08/14 Admitting Physician: Nestor Ramp, MD  Primary Care Provider: No PCP Per Patient Consultants: None  Indication for Hospitalization: Dyspnea, hypoxia   Discharge Diagnoses/Problem List:  Combined CHF exacerbation  OSA Hypertension, poorly controlled  Diabetes mellitus, type 2 CKD stage 2  Disposition: Home  Discharge Condition: Stable, improved   Discharge Exam:  Blood pressure 134/93, pulse 82, temperature 98.7 F (37.1 C), temperature source Oral, resp. rate 18, height 6' (1.829 m), weight 303 lb 1.6 oz (137.485 kg), SpO2 96 %. Gen: Sitting up in bed in NAD CV: RRR, no m/r/g noted. No JVD. Trace pitting edema.  Resp: No increased WOB. CTAB without wheezing, rhonchi, or crackles noted. On RA Ext: No gross deformities Abd: +BS, soft, ND/NT  Brief Hospital Course:  Ms. Arons is a 39 y/o male with a PMHx of OSA (not on CPAP due to finances) and combined CHF who presented with dyspnea that has been present intermittently in the AM, however became more frequent and during the day over the last 4 days prior to his presentation. He also noted PND and orthopnea as well. Believes dietary indiscretion over the long holiday is to blame, however has not taken a PRN Lasix in 4 days prior to his presentation. In the ED, a CXR revealed mild pulmonary edema. He received Lasix 40mg  IV and albuterol (no wheezing, however per report to help open the airways). He was going to be discharged home however then noted to have a new O2 requirement of 4L to maintain O2>90%.   On admission, he was breathing comfortably on RA, however his BP was noted to be 140s-150s/110s-120s. He was continued on Lasix 40mg  IV BID and placed on CPAP overnight. The subsequent day his dyspnea,  orthopnea, and PND had resolved. Lasix was discontinued at a weight of 301, however on the subsequent day he was back at 303 with trace pitting edema. A echo revealed worsening EF of 25% with diffuse hypokinesis. He was discharged with Lasix 40mg  PO daily and KDUR .   As for the pt's BP, after increasing hydralazine to 50mg  QID, his BPs improved significantly to 120s-130s-70-90s. His ASCVD 10 yr risk was 15.3%, therefore he was started on pravastatin 80mg  ($16 for a 90d supply at [facility]).   Issues for Follow Up:  1. Follow up need for daily Lasix, could consider returning back to PRN (dry wt appears to be ~301 now). 2. Follow up potassium  3. CSW worked with [facility] to obtain CPAP for home use (pt has f/u with pulm). 4. If patient gets assistance with medications, could switch to Lipitor if possible. 5. Follow up on pt's BPs as significant changes made during this hospitalization.   Significant Procedures: None   Significant Labs and Imaging:   Recent Labs Lab 12/06/14 1027  WBC 7.9  HGB 12.5*  HCT 38.1*  PLT 259    Recent Labs Lab 12/06/14 1027 12/07/14 0539 12/08/14 0442  NA 139 140 138  K 4.3 3.6 3.7  CL 106 101 103  CO2 27 30 27   GLUCOSE 127* 105* 96  BUN 15 16 20   CREATININE 1.28* 1.40* 1.26*  CALCIUM 8.5* 8.7* 8.7*   BNP 397   CXR: New slight interstitial pulmonary edema.  EKG: NSR, HR 95. Twaves inversions in V5, V6, III, aVFand  flattening in aVR, all present previously. QTC noted to be prolonged to 675.  EKG: NSR, HR 79. T wave inversions in V4 (new), V5, V6, III, and aVF with flattening in aVR. QTc improved to 48. Possible incomplete LBBB.   Echo- EF 25% with diffuse hypokinesis.   Results/Tests Pending at Time of Discharge: none  Discharge Medications:    Medication List    TAKE these medications        albuterol 108 (90 BASE) MCG/ACT inhaler  Commonly known as:  PROVENTIL HFA;VENTOLIN HFA  Inhale 1-2 puffs into the lungs every 6 (six) hours as  needed for wheezing or shortness of breath.     aspirin 81 MG EC tablet  Take 1 tablet (81 mg total) by mouth daily.     carvedilol 12.5 MG tablet  Commonly known as:  COREG  Take 1 tablet (12.5 mg total) by mouth 2 (two) times daily with a meal.     furosemide 40 MG tablet  Commonly known as:  LASIX  Take 1 tablet (40 mg total) by mouth daily.     hydrALAZINE 50 MG tablet  Commonly known as:  APRESOLINE  Take 1 tablet (50 mg total) by mouth every 6 (six) hours.     isosorbide mononitrate 30 MG 24 hr tablet  Commonly known as:  IMDUR  Take 1 tablet (30 mg total) by mouth daily.     lisinopril 20 MG tablet  Commonly known as:  PRINIVIL,ZESTRIL  Take 1 tablet (20 mg total) by mouth daily.     metFORMIN 500 MG tablet  Commonly known as:  GLUCOPHAGE  Take 1 tablet (500 mg total) by mouth 2 (two) times daily with a meal.     omeprazole 20 MG capsule  Commonly known as:  PRILOSEC  Take 1 capsule (20 mg total) by mouth daily.     polyethylene glycol powder powder  Commonly known as:  MIRALAX  Take 17 g by mouth daily.     potassium chloride 10 MEQ tablet  Commonly known as:  K-DUR  Take 1 tablet (10 mEq total) by mouth daily.     pravastatin 80 MG tablet  Commonly known as:  PRAVACHOL  Take 1 tablet (80 mg total) by mouth daily.     spironolactone 25 MG tablet  Commonly known as:  ALDACTONE  Take 1 tablet (25 mg total) by mouth daily.     VITAMIN D PO  Take 1 tablet by mouth daily.        Discharge Instructions: Please refer to Patient Instructions section of EMR for full details.  Patient was counseled important signs and symptoms that should prompt return to medical care, changes in medications, dietary instructions, activity restrictions, and follow up appointments.   Follow-Up Appointments: Follow-up Information    Follow up with Marca Ancona, MD On 12/28/2014.   Specialty:  Cardiology   Why:  at 11:40AM (code to get into parking deck is 0080).   Contact  information:   [address] [place] [address] [phone]       Follow up with [facility] On 01/11/2015.   Why:  at 2:15pm    Contact information:   [address] [place] [address]       Follow up with Marca Ancona, MD. Go on 12/29/2014.   Specialty:  Cardiology   Why:  at 3pm in [facility]--gate code 0800--please bring all medications   Contact information:  [address] [place] [address] [phone]       Follow up with [facility]     On 12/11/2014.   Why:  at 2:45 pm; please try to keep your apt or call to reschedule   Contact information:   [address] [place] [address] [phone]      Follow up with [facility]     On 12/28/2014.   Why:  at 4:30 pm for eligability for further assistance/financial/medication   Contact information:   [address] [place] [address] [phone]      Follow up with [facility].   Why:  they will contact you about your CPAP machine at home   Contact information:   [address] [place] [address] [phone]       Joanna Puff, MD 12/08/2014, 4:14 PM PGY-1, [facility]

## 2014-12-08 NOTE — Telephone Encounter (Signed)
Hi Rhonda - Please advise on status of this patient's CPAP.  Thanks.

## 2014-12-08 NOTE — Progress Notes (Signed)
Follow up apt made at the [facility] and [facility], also eligibility apt ( for medication and other assistance. Also informed patient that he can get his prescriptions filled there at discharge- patient currently goes to [place]. [facility] concerning CPAP machine for home, they will contact the patient at home about when the CPAP will be delivered; B Salina RN,BSN,MHA [phone]

## 2014-12-08 NOTE — Progress Notes (Signed)
Order received for patient to take a shower. CCMD notified to place tele on standby while patient showers. Patient independent

## 2014-12-08 NOTE — Progress Notes (Signed)
  Echocardiogram 2D Echocardiogram has been performed.  Shaquila Sigman 12/08/2014, 10:19 AM

## 2014-12-11 ENCOUNTER — Encounter: Payer: Self-pay | Admitting: Family Medicine

## 2014-12-11 ENCOUNTER — Ambulatory Visit: Payer: Self-pay | Attending: Family Medicine | Admitting: Family Medicine

## 2014-12-11 VITALS — BP 116/68 | HR 79 | Temp 98.4°F | Resp 18 | Ht 72.0 in | Wt 303.2 lb

## 2014-12-11 DIAGNOSIS — I5021 Acute systolic (congestive) heart failure: Secondary | ICD-10-CM | POA: Insufficient documentation

## 2014-12-11 DIAGNOSIS — R0902 Hypoxemia: Secondary | ICD-10-CM | POA: Insufficient documentation

## 2014-12-11 DIAGNOSIS — I2510 Atherosclerotic heart disease of native coronary artery without angina pectoris: Secondary | ICD-10-CM | POA: Insufficient documentation

## 2014-12-11 DIAGNOSIS — E669 Obesity, unspecified: Secondary | ICD-10-CM | POA: Insufficient documentation

## 2014-12-11 DIAGNOSIS — Z6841 Body Mass Index (BMI) 40.0 and over, adult: Secondary | ICD-10-CM | POA: Insufficient documentation

## 2014-12-11 DIAGNOSIS — G4733 Obstructive sleep apnea (adult) (pediatric): Secondary | ICD-10-CM | POA: Insufficient documentation

## 2014-12-11 DIAGNOSIS — E119 Type 2 diabetes mellitus without complications: Secondary | ICD-10-CM | POA: Insufficient documentation

## 2014-12-11 DIAGNOSIS — I10 Essential (primary) hypertension: Secondary | ICD-10-CM | POA: Insufficient documentation

## 2014-12-11 MED ORDER — CARVEDILOL 12.5 MG PO TABS: 12.5000 mg | ORAL_TABLET | Freq: Two times a day (BID) | ORAL | Status: DC

## 2014-12-11 MED ORDER — TRUE METRIX METER W/DEVICE KIT: 1.0000 | PACK | Freq: Once | Status: DC | PRN

## 2014-12-11 MED ORDER — ISOSORBIDE MONONITRATE ER 30 MG PO TB24: 30.0000 mg | ORAL_TABLET | Freq: Every day | ORAL | Status: DC

## 2014-12-11 MED ORDER — ASPIRIN 81 MG PO TBEC: 81.0000 mg | DELAYED_RELEASE_TABLET | Freq: Every day | ORAL | Status: DC

## 2014-12-11 MED ORDER — GLUCOSE BLOOD VI STRP: ORAL_STRIP | Status: DC

## 2014-12-11 MED ORDER — FUROSEMIDE 40 MG PO TABS: 40.0000 mg | ORAL_TABLET | Freq: Every day | ORAL | Status: DC

## 2014-12-11 MED ORDER — OMEPRAZOLE 20 MG PO CPDR: 20.0000 mg | DELAYED_RELEASE_CAPSULE | Freq: Every day | ORAL | Status: DC

## 2014-12-11 MED ORDER — PRAVASTATIN SODIUM 80 MG PO TABS: 80.0000 mg | ORAL_TABLET | Freq: Every day | ORAL | Status: DC

## 2014-12-11 MED ORDER — POTASSIUM CHLORIDE ER 10 MEQ PO TBCR: 10.0000 meq | EXTENDED_RELEASE_TABLET | Freq: Every day | ORAL | Status: DC

## 2014-12-11 MED ORDER — LISINOPRIL 20 MG PO TABS: 20.0000 mg | ORAL_TABLET | Freq: Every day | ORAL | Status: DC

## 2014-12-11 MED ORDER — SPIRONOLACTONE 25 MG PO TABS: 25.0000 mg | ORAL_TABLET | Freq: Every day | ORAL | Status: DC

## 2014-12-11 MED ORDER — HYDRALAZINE HCL 50 MG PO TABS: 50.0000 mg | ORAL_TABLET | Freq: Four times a day (QID) | ORAL | Status: DC

## 2014-12-11 MED ORDER — TRUEPLUS LANCETS 30G MISC: 1.0000 | Freq: Every morning | Status: AC

## 2014-12-11 MED ORDER — TRUE METRIX METER W/DEVICE KIT
1.0000 | PACK | Freq: Once | Status: AC | PRN
Start: 2014-12-11 — End: ?

## 2014-12-11 NOTE — Progress Notes (Signed)
Patient here for hospital follow up from fluid around lungs and heart making it difficult for patient to breathe, and to establish care.  Patient states that he is feeling "much better."  Patient has no complaints of pain at this time.

## 2014-12-11 NOTE — Patient Instructions (Signed)

## 2014-12-11 NOTE — Progress Notes (Signed)
Subjective:    Patient ID: Vernon Reed, male    DOB: Mar 27, 1976, 39 y.o.   MRN: [id number]  HPI  Admit date: 12/06/14 Discharge date: 12/08/14  Vernon Reed is a 39 year old male with a history of obstructive sleep apnea, congestive heart failure (with 2-D echo from 12/2013 revealing EF of 45-50%, grade 1 diastolic dysfunction, mildly reduced systolic function and lateral wall hypokinesis) , type 2 diabetes mellitus who had presented to the ED with worsening shortness of breath, orthopnea, paroxysmal nocturnal dyspnea.  He had admitted taking his Lasix on an as needed basis.On admission, he was breathing comfortably on RA, he received some oxygen and albuterol to open his airway;  however his BP was noted to be 140s-150s/110s-120s. His chest x-ray revealed mild pulmonary edema. He was continued on Lasix  IV BID and placed on CPAP overnight. His dyspnea, orthopnea, and PND had resolved. Lasix was discontinued at a weight of 301, however on the subsequent day he was back at 303 with trace pitting edema. A echo revealed worsening EF of 25% with diffuse hypokinesis. He was discharged with Lasix  PO daily and KDUR . He was placed on pravastatin due to an increased ASCVD 10 year risk of 15.3%   Once his condition stabilized he was discharged with the recommendation to follow up on an outpatient basis.  Interval history: He does have an upcoming appointment with cardiology on 12/28/14 and an appointment with pulmonary on 01/11/15. He denies any chest pains, shortness of breath or recent weight gain and is doing well on his medications. He has no acute issues today.  Past Medical History  Diagnosis Date  . Hypertension   . Hyperlipemia   . CHF (congestive heart failure)   . OSA (obstructive sleep apnea)     "should wear mask but I don't have one" (12/06/2014)  . Headache     "maybe 5 times/yr; nothing regular" (12/06/2014)  . GERD (gastroesophageal reflux disease)   . Type II diabetes  mellitus     Past Surgical History  Procedure Laterality Date  . Abdominal surgery  ~ 1987    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  . Left and right heart catheterization with coronary angiogram N/A 07/26/2013    Procedure: LEFT AND RIGHT HEART CATHETERIZATION WITH CORONARY ANGIOGRAM;  Surgeon: Marykay Lex, MD;  Location: [facility] CATH LAB;  Service: Cardiovascular;  Laterality: N/A;  . Appendectomy  ~ 1989  . Cardiac catheterization  07/2013    No Known Allergies  Current Outpatient Prescriptions on File Prior to Visit  Medication Sig Dispense Refill  . Cholecalciferol (VITAMIN D PO) Take 1 tablet by mouth daily.    . polyethylene glycol powder (MIRALAX) powder Take 17 g by mouth daily. (Patient taking differently: Take 17 g by mouth as needed for mild constipation. ) 255 g 0  . albuterol (PROVENTIL HFA;VENTOLIN HFA) 108 (90 BASE) MCG/ACT inhaler Inhale 1-2 puffs into the lungs every 6 (six) hours as needed for wheezing or shortness of breath. (Patient not taking: Reported on 12/11/2014) 1 Inhaler 0   No current facility-administered medications on file prior to visit.        Review of Systems  Constitutional: Negative for activity change and appetite change.  HENT: Negative for sinus pressure and sore throat.   Eyes: Negative for visual disturbance.  Respiratory: Negative for chest tightness and shortness of breath.   Cardiovascular: Negative for chest pain and palpitations.  Gastrointestinal: Negative for abdominal pain and abdominal distention.  Endocrine: Negative for cold intolerance, heat intolerance and polyphagia.  Genitourinary: Negative for dysuria, frequency and difficulty urinating.  Musculoskeletal: Negative for back pain, joint swelling and arthralgias.  Skin: Negative for color change.  Neurological: Negative for dizziness, tremors and weakness.  Psychiatric/Behavioral: Negative for suicidal ideas and behavioral problems.        Objective:   Filed Vitals:    12/11/14 1448  BP: 116/68  Pulse: 79  Temp: 98.4 F (36.9 C)  TempSrc: Oral  Resp: 18  Height: 6' (1.829 m)  Weight: 303 lb 3.2 oz (137.531 kg)  SpO2: 93%      Physical Exam  Constitutional: He is oriented to person, place, and time. He appears well-developed and well-nourished.  Obese  HENT:  Head: Normocephalic and atraumatic.  Right Ear: External ear normal.  Left Ear: External ear normal.  Eyes: Conjunctivae and EOM are normal. Pupils are equal, round, and reactive to light.  Neck: Normal range of motion. Neck supple. No tracheal deviation present.  Cardiovascular: Normal rate, regular rhythm and normal heart sounds.   No murmur heard. Pulmonary/Chest: Effort normal and breath sounds normal. No respiratory distress. He has no wheezes. He exhibits no tenderness.  Abdominal: Soft. Bowel sounds are normal. He exhibits no mass. There is no tenderness.  Musculoskeletal: Normal range of motion. He exhibits no edema or tenderness.  Neurological: He is alert and oriented to person, place, and time.  Skin: Skin is warm and dry.  Psychiatric: He has a normal mood and affect.      Transthoracic Echocardiography  Patient:  Vernon Reed, Vernon Reed MR #:    [id number] Study Date: 12/08/2014 Gender:   M Age:    39 Height:   182.9 cm Weight:   137.4 kg BSA:    2.7 m^2 Pt. Status: Room:    3E06C  ADMITTING  Recardo Evangelist REFERRING  Nestor Ramp SONOGRAPHER Cathie Beams ATTENDING  Azalia Bilis [id number] PERFORMING  [facility], Inpatient  cc:  ------------------------------------------------------------------- LV EF: 25%  ------------------------------------------------------------------- Indications:   CHF - 428.0. Dyspnea 786.09.  ------------------------------------------------------------------- History:  PMH: Obstructive sleep apnea. Reflux. Congestive heart failure. Risk factors: Hypertension. Diabetes  mellitus. Dyslipidemia.  ------------------------------------------------------------------- Study Conclusions  - Left ventricle: The cavity size was mildly to moderately dilated. Wall thickness was increased in a pattern of moderate LVH. The estimated ejection fraction was 25%. Diffuse hypokinesis. - Aortic valve: There was trivial regurgitation. - Mitral valve: There was mild regurgitation. - Right ventricle: The cavity size was mildly dilated. Systolic function was mildly reduced. - Pulmonary arteries: PA peak pressure: 32 mm Hg (S).  Transthoracic echocardiography. M-mode, complete 2D, spectral Doppler, and color Doppler. Birthdate: Patient birthdate: [birth year]. Age: Patient is 39 yr old. Sex: Gender: male. BMI: 41.1 kg/m^2. Blood pressure:   138/94 Patient status: Inpatient. Study date: Study date: 12/08/2014. Study time: 09:34 AM. Location: Bedside.  -------------------------------------------------------------------  ------------------------------------------------------------------- Left ventricle: The cavity size was mildly to moderately dilated. Wall thickness was increased in a pattern of moderate LVH. The estimated ejection fraction was 25%. Diffuse hypokinesis.  ------------------------------------------------------------------- Aortic valve:  Structurally normal valve.  Cusp separation was normal. Doppler: Transvalvular velocity was within the normal range. There was no stenosis. There was trivial regurgitation.  ------------------------------------------------------------------- Aorta: Aortic root: The aortic root was normal in size.  ------------------------------------------------------------------- Mitral valve:  The valve appears to be grossly normal.  Doppler: There was mild regurgitation.  Peak gradient (D): 7 mm Hg.  ------------------------------------------------------------------- Right ventricle: The cavity  size  was mildly dilated. Systolic function was mildly reduced.  ------------------------------------------------------------------- Pulmonic valve:  The valve appears to be grossly normal. Doppler: There was no significant regurgitation.  ------------------------------------------------------------------- Tricuspid valve:  The valve appears to be grossly normal. Doppler: There was mild regurgitation.  ------------------------------------------------------------------- Right atrium: The atrium was at the upper limits of normal in size.  ------------------------------------------------------------------- Pericardium: There was no pericardial effusion.       Assessment & Plan:  39 year old male with a history of obstructive sleep apnea, acute systolic CHF (EF 16% from 12/2014) , type 2 diabetes mellitus recently managed for acute exacerbation of CHF and is now doing well on medications.  Acute systolic CHF: EF 10% from 12/2014 No evidence of fluid overload; review of hospital notes indicated dry weight 301 pounds and his weight today is 2 pounds above that and so I will keep him on his Lasix. We'll monitor his potassium and creatinine; repeat labs at next office visit Continue Lasix as well as other cardiac meds. Daily weights, limit fluid intake to less than 2 L a day, low-sodium diet.  Diabetes mellitus: A1c of 6.1, diet controlled. He states he has not been taking his metformin and so I will discontinue this altogether excessively as this is contraindicated given his history of CHF. Prescription for glucometer and test strips written for him to monitor his sugar at home in the event that blood sugars are elevated above goal he may need low-dose glipizide.  Hypoxia: Initial oxygen saturation is 93% which came up to 97% on repeat.  Hypertension: Controlled, continue antihypertensives.  Obesity: Lifestyle modification advised, increase physical activity as tolerated, reduce  portion sizes.  Obstructive sleep apnea: Not using CPAP due to financial constraints. Keep appointment with [facility]

## 2014-12-12 ENCOUNTER — Other Ambulatory Visit: Payer: Self-pay | Admitting: Family Medicine

## 2014-12-12 DIAGNOSIS — Z202 Contact with and (suspected) exposure to infections with a predominantly sexual mode of transmission: Secondary | ICD-10-CM

## 2014-12-12 MED ORDER — METRONIDAZOLE 500 MG PO TABS: 2000.0000 mg | ORAL_TABLET | Freq: Once | ORAL | Status: DC

## 2014-12-12 NOTE — Progress Notes (Signed)
Patient was exposed to Trichomonasis via partner

## 2014-12-13 NOTE — Telephone Encounter (Signed)
pcc's please advise if this message can be closed.  Thanks! 

## 2014-12-13 NOTE — Telephone Encounter (Signed)
Called and lmoam for pt to return my call about cpap assistance program. Ollen Gross

## 2014-12-14 NOTE — Telephone Encounter (Signed)
Pt returned my call and advised that he had a cpap machine from [facility] that was delivered to him when he was released from the hospital as a loaner. I advised patient to come up the our office and sign form for the cpap assistance program Thurs 12/14/14 or Friday 12/15/14 and ask for [facility]. Once this has been completed and faxed to [employer] CPAP Assistance Program and donation made, he will get his own cpap and can return the loaner to [facility]. Waiting on pt to come and sign form to start this process.  Rhonda J Cobb

## 2014-12-28 ENCOUNTER — Ambulatory Visit: Payer: Self-pay

## 2014-12-28 ENCOUNTER — Telehealth (HOSPITAL_COMMUNITY): Payer: Self-pay | Admitting: Vascular Surgery

## 2014-12-28 ENCOUNTER — Encounter (HOSPITAL_COMMUNITY): Payer: Self-pay

## 2014-12-28 NOTE — Telephone Encounter (Signed)
Left pt another message about appt for 12/29/14

## 2014-12-28 NOTE — Telephone Encounter (Signed)
Left pt a message stating his appt 6/24.Marland Kitchen Needs to be change to another day

## 2014-12-29 ENCOUNTER — Encounter (HOSPITAL_COMMUNITY): Payer: Self-pay

## 2014-12-29 ENCOUNTER — Ambulatory Visit (HOSPITAL_COMMUNITY)
Admission: RE | Admit: 2014-12-29 | Discharge: 2014-12-29 | Disposition: A | Discharge status: Home / Self Care | Payer: Self-pay | Source: Ambulatory Visit | Attending: Internal Medicine | Admitting: Internal Medicine

## 2014-12-29 VITALS — BP 118/78 | HR 84 | Wt 306.5 lb

## 2014-12-29 DIAGNOSIS — I10 Essential (primary) hypertension: Secondary | ICD-10-CM

## 2014-12-29 DIAGNOSIS — Z8249 Family history of ischemic heart disease and other diseases of the circulatory system: Secondary | ICD-10-CM | POA: Insufficient documentation

## 2014-12-29 DIAGNOSIS — E1122 Type 2 diabetes mellitus with diabetic chronic kidney disease: Secondary | ICD-10-CM | POA: Insufficient documentation

## 2014-12-29 DIAGNOSIS — N189 Chronic kidney disease, unspecified: Secondary | ICD-10-CM | POA: Insufficient documentation

## 2014-12-29 DIAGNOSIS — E785 Hyperlipidemia, unspecified: Secondary | ICD-10-CM | POA: Insufficient documentation

## 2014-12-29 DIAGNOSIS — I129 Hypertensive chronic kidney disease with stage 1 through stage 4 chronic kidney disease, or unspecified chronic kidney disease: Secondary | ICD-10-CM | POA: Insufficient documentation

## 2014-12-29 DIAGNOSIS — G4733 Obstructive sleep apnea (adult) (pediatric): Secondary | ICD-10-CM | POA: Insufficient documentation

## 2014-12-29 DIAGNOSIS — Z7982 Long term (current) use of aspirin: Secondary | ICD-10-CM | POA: Insufficient documentation

## 2014-12-29 DIAGNOSIS — Z79899 Other long term (current) drug therapy: Secondary | ICD-10-CM | POA: Insufficient documentation

## 2014-12-29 DIAGNOSIS — I5022 Chronic systolic (congestive) heart failure: Secondary | ICD-10-CM | POA: Insufficient documentation

## 2014-12-29 DIAGNOSIS — K219 Gastro-esophageal reflux disease without esophagitis: Secondary | ICD-10-CM | POA: Insufficient documentation

## 2014-12-29 LAB — BASIC METABOLIC PANEL
ANION GAP: 7 (ref 5–15)
BUN: 13 mg/dL (ref 6–20)
CALCIUM: 8.6 mg/dL — AB (ref 8.9–10.3)
CO2: 28 mmol/L (ref 22–32)
CREATININE: 1.31 mg/dL — AB (ref 0.61–1.24)
Chloride: 104 mmol/L (ref 101–111)
GFR calc Af Amer: >60 mL/min (ref >60)
GFR calc non Af Amer: >60 mL/min (ref >60)
GLUCOSE: 117 mg/dL — AB (ref 65–99)
Potassium: 3.7 mmol/L (ref 3.5–5.1)
Sodium: 139 mmol/L (ref 135–145)

## 2014-12-29 LAB — BRAIN NATRIURETIC PEPTIDE: B NATRIURETIC PEPTIDE 5: 100.7 pg/mL — AB (ref 0.0–100.0)

## 2014-12-29 MED ORDER — HYDRALAZINE HCL 50 MG PO TABS: 75.0000 mg | ORAL_TABLET | Freq: Three times a day (TID) | ORAL | Status: DC

## 2014-12-29 MED ORDER — ISOSORBIDE MONONITRATE ER 60 MG PO TB24: 60.0000 mg | ORAL_TABLET | Freq: Every day | ORAL | Status: DC

## 2014-12-29 NOTE — Patient Instructions (Signed)
Change Hydralazine to 75 mg Three times a day   Change Isosorbide to 60 mg daily  Labs today  Your physician recommends that you schedule a follow-up appointment in: 1 month

## 2014-12-31 NOTE — Progress Notes (Signed)
Patient ID: Vernon Reed, male   DOB: 11-Sep-1975, 39 y.o.   MRN: [id number]  PCP: None  HPI: [name] is a 39 yo male with a history of HTN, HLD, OSA on CPAP, obesity and chronic systolic HF. Admitted to the hospital 1/16-1/20/15 for SOB. Had L/RHC with normal coronaries.   Admitted 6/23-6/25/15 for SOB. He was diuresed and medications restarted which he became hypotensive and were scaled back. He had AKI. Discharge weight was 310 lbs  He quit taking his meds and was admitted again in 6/16 with CHF exacerbation.  He was treated with IV Lasix and diuresed well.  He is now back on all his meds.  Weight is down 4 lbs compared to last appointment in 9/15.  No dyspnea.  He can climb a flight of steps without problems.  No orthopnea.  No lightheadedness.  No chest pain.    RHC/LHC  07/26/13  RA 10 RV 36/10/13 PA 34/16 (23) PCWP 20124 77 Fick CO/CI 6.18/2.4 Normal Cors   ECHO 07/2013: EF 30-35% ECHO 12/28/2013: EF 45-50%, grade 1 DD.  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 ETOH occasionally. No tobacco abuse or drugs.   FH: Mother living: - HTN, HLD       Father- HTN  ROS: All systems negative except as listed in HPI, PMH and Problem List.  Past Medical History  Diagnosis Date  . Hypertension   . Hyperlipemia   . CHF (congestive heart failure)   . OSA (obstructive sleep apnea)     "should wear mask but I don't have one" (12/06/2014)  . Headache     "maybe 5 times/yr; nothing regular" (12/06/2014)  . GERD (gastroesophageal reflux disease)   . Type II diabetes mellitus     Current Outpatient Prescriptions  Medication Sig Dispense Refill  . aspirin 81 MG EC tablet Take 1 tablet (81 mg total) by mouth daily. 30 tablet 2  . Blood Glucose Monitoring  Suppl (TRUE METRIX METER) W/DEVICE KIT 1 each by Does not apply route once as needed. 1 kit 0  . carvedilol (COREG) 12.5 MG tablet Take 1 tablet (12.5 mg total) by mouth 2 (two) times daily with a meal. 30 tablet 3  . Cholecalciferol (VITAMIN D PO) Take 1 tablet by mouth daily.    . furosemide (LASIX) 40 MG tablet Take 1 tablet (40 mg total) by mouth daily. 30 tablet 2  . glucose blood (TRUE METRIX BLOOD GLUCOSE TEST) test strip Use as instructed once daily before breakfast 50 each 12  . hydrALAZINE (APRESOLINE) 50 MG tablet Take 1.5 tablets (75 mg total) by mouth 3 (three) times daily. 135 tablet 2  . isosorbide mononitrate (IMDUR) 60 MG 24 hr tablet Take 1 tablet (60 mg total) by mouth daily. 30 tablet 3  . lisinopril (PRINIVIL,ZESTRIL) 20 MG tablet Take 1 tablet (20 mg total) by mouth daily. 30 tablet 3  . omeprazole (PRILOSEC) 20 MG capsule Take 1 capsule (20 mg total) by mouth daily. 30 capsule 3  . polyethylene glycol powder (MIRALAX) powder Take 17 g by mouth daily. (Patient taking differently: Take 17 g by mouth as needed for mild constipation. ) 255 g 0  . potassium chloride (K-DUR) 10 MEQ tablet Take 1 tablet (10 mEq total)  by mouth daily. 30 tablet 0  . pravastatin (PRAVACHOL) 80 MG tablet Take 1 tablet (80 mg total) by mouth daily. 30 tablet 0  . spironolactone (ALDACTONE) 25 MG tablet Take 1 tablet (25 mg total) by mouth daily. 30 tablet 3  . TRUEPLUS LANCETS 30G MISC 1 each by Does not apply route every morning. 50 each 11  . albuterol (PROVENTIL HFA;VENTOLIN HFA) 108 (90 BASE) MCG/ACT inhaler Inhale 1-2 puffs into the lungs every 6 (six) hours as needed for wheezing or shortness of breath. (Patient not taking: Reported on 12/11/2014) 1 Inhaler 0  . metroNIDAZOLE (FLAGYL) 500 MG tablet Take 4 tablets (2,000 mg total) by mouth once. (Patient not taking: Reported on 12/29/2014) 4 tablet 0   No current facility-administered medications for this encounter.    Filed Vitals:   12/29/14 1417   BP: 118/78  Pulse: 84  Weight: 306 lb 8 oz (139.027 kg)  SpO2: 95%   PHYSICAL EXAM: General:  Well appearing. No resp difficulty. Ambulated in the clinic without difficulty HEENT: normal Neck: supple. JVP flat. Carotids 2+ bilaterally; no bruits. No lymphadenopathy or thryomegaly appreciated. Cor: PMI normal. Regular rate & rhythm. No rubs, gallops or murmurs. Lungs: clear Abdomen: obese, soft, nontender, nondistended. No hepatosplenomegaly. No bruits or masses. Good bowel sounds. Extremities: no cyanosis, clubbing, rash, edema Neuro: alert & orientedx3, cranial nerves grossly intact. Moves all 4 extremities w/o difficulty. Affect pleasant.   ASSESSMENT & PLAN:  1) Chronic systolic HF: NICM, EF 16% 6/16 echo.  He had been off all his medications.  These have now been restarted.  ?Hypertensive cardiomyopathy.  NYHA class II symptoms, he is not volume overloaded.  - Continue Lasix 40 mg daily.  BMET/BNP today.   - Continue coreg 12.5 mg twice a day - Continue lisinopril 20 mg daily.  - I will change hydralazine/Imdur to Bidil 2 tabs tid. - Repeat echo in 6 months on stable medication regimen.  - Reinforced the need and importance of daily weights, a low sodium diet, and fluid restriction (less than 2 L a day). Instructed to call the HF clinic if weight increases more than 3 lbs overnight or 5 lbs in a week.  Check BMET now.   2) HTN: Much  Improved.  Talked to him at length about not stopping his meds.      3) OSA: Uses CPAP.   4) CKD: BMET today.   Follow up in 1 month   Loralie Champagne  12/31/2014

## 2015-01-11 ENCOUNTER — Inpatient Hospital Stay: Payer: Self-pay | Admitting: Adult Health

## 2015-01-11 ENCOUNTER — Ambulatory Visit: Payer: Self-pay | Admitting: Family Medicine

## 2015-01-29 ENCOUNTER — Encounter (HOSPITAL_COMMUNITY): Payer: Self-pay

## 2015-01-29 ENCOUNTER — Ambulatory Visit (HOSPITAL_COMMUNITY)
Admission: RE | Admit: 2015-01-29 | Discharge: 2015-01-29 | Disposition: A | Discharge status: Home / Self Care | Payer: Self-pay | Source: Ambulatory Visit | Attending: Cardiology | Admitting: Cardiology

## 2015-01-29 VITALS — BP 158/90 | HR 91 | Wt 311.4 lb

## 2015-01-29 DIAGNOSIS — I509 Heart failure, unspecified: Secondary | ICD-10-CM | POA: Insufficient documentation

## 2015-01-29 DIAGNOSIS — I5022 Chronic systolic (congestive) heart failure: Secondary | ICD-10-CM

## 2015-01-29 MED ORDER — CARVEDILOL 12.5 MG PO TABS: 12.5000 mg | ORAL_TABLET | Freq: Two times a day (BID) | ORAL | Status: DC

## 2015-01-29 MED ORDER — SPIRONOLACTONE 25 MG PO TABS: 25.0000 mg | ORAL_TABLET | Freq: Every day | ORAL | Status: DC

## 2015-01-29 MED ORDER — SACUBITRIL-VALSARTAN 49-51 MG PO TABS: 1.0000 | ORAL_TABLET | Freq: Two times a day (BID) | ORAL | Status: DC

## 2015-01-29 MED ORDER — POTASSIUM CHLORIDE ER 10 MEQ PO TBCR
10.0000 meq | EXTENDED_RELEASE_TABLET | Freq: Every day | ORAL | Status: DC
Start: 2015-01-29 — End: 2015-03-05

## 2015-01-29 NOTE — Patient Instructions (Addendum)
STOP Lisinopril Wait 36 hours then START Entresto 49/51 mg, one tab twice a day  Labs needed in 10 days (BMET)  Your physician recommends that you schedule a follow-up appointment in: 3 weeks in the NP clinic  Do the following things EVERYDAY: 1) Weigh yourself in the morning before breakfast. Write it down and keep it in a log. 2) Take your medicines as prescribed 3) Eat low salt foods-Limit salt (sodium) to 2000 mg per day.  4) Stay as active as you can everyday 5) Limit all fluids for the day to less than 2 liters 6)

## 2015-01-29 NOTE — Progress Notes (Signed)
Patient ID: Vernon Reed, male   DOB: Jun 16, 1976, 39 y.o.   MRN: [id number] PCP: None  HPI: Vernon Reed is a 39 yo male with a history of HTN, HLD, OSA on CPAP, obesity and chronic systolic HF. Admitted to the hospital 1/16-1/20/15 for SOB. Had L/RHC with normal coronaries.   Admitted 6/23-6/25/15 for SOB. He was diuresed and medications restarted which he became hypotensive and were scaled back. He had AKI. Discharge weight was 310 lbs  He returns for HF follow up. Has been out of carvedilol for the last 4 days. Complaining of fatigue. Mild dyspnea with exertion. Denies orthopnea/PND.  No chest pain.  Not following low salt diet. Did not refill meds.   RHC/LHC  07/26/13  RA 10 RV 36/10/13 PA 34/16 (23) PCWP 20124 77 Fick CO/CI 6.18/2.4 Normal Cors   ECHO 07/2013: EF 30-35% ECHO 12/28/2013: EF 45-50%, grade 1 DD.  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 No tobacco abuse or drugs.   FH: Mother living: - HTN, HLD       Father- HTN  ROS: All systems negative except as listed in HPI, PMH and Problem List.  Past Medical History  Diagnosis Date  . Hypertension   . Hyperlipemia   . CHF (congestive heart failure)   . OSA (obstructive sleep apnea)     "should wear mask but I don't have one" (12/06/2014)  . Headache     "maybe 5 times/yr; nothing regular" (12/06/2014)  . GERD (gastroesophageal reflux disease)   . Type II diabetes mellitus     Current Outpatient Prescriptions  Medication Sig Dispense Refill  . albuterol (PROVENTIL HFA;VENTOLIN HFA) 108 (90 BASE) MCG/ACT inhaler Inhale 1-2 puffs into the lungs every 6 (six) hours as needed for wheezing or shortness of breath. 1 Inhaler 0  . aspirin 81 MG EC tablet Take 1 tablet (81 mg  total) by mouth daily. 30 tablet 2  . Blood Glucose Monitoring Suppl (TRUE METRIX METER) W/DEVICE KIT 1 each by Does not apply route once as needed. 1 kit 0  . carvedilol (COREG) 12.5 MG tablet Take 1 tablet (12.5 mg total) by mouth 2 (two) times daily with a meal. 30 tablet 3  . Cholecalciferol (VITAMIN D PO) Take 1 tablet by mouth daily.    . furosemide (LASIX) 40 MG tablet Take 1 tablet (40 mg total) by mouth daily. 30 tablet 2  . glucose blood (TRUE METRIX BLOOD GLUCOSE TEST) test strip Use as instructed once daily before breakfast 50 each 12  . hydrALAZINE (APRESOLINE) 50 MG tablet Take 1.5 tablets (75 mg total) by mouth 3 (three) times daily. 135 tablet 2  . isosorbide mononitrate (IMDUR) 60 MG 24 hr tablet Take 1 tablet (60 mg total) by mouth daily. 30 tablet 3  . lisinopril (PRINIVIL,ZESTRIL) 20 MG tablet Take 1 tablet (20 mg total) by mouth daily. 30 tablet 3  . omeprazole (PRILOSEC) 20 MG capsule Take 1 capsule (20 mg total) by mouth daily. 30 capsule 3  . polyethylene glycol powder (MIRALAX) powder Take 17 g by mouth daily. (Patient taking differently: Take 17 g by mouth as needed for mild constipation. ) 255 g 0  . potassium chloride (K-DUR) 10 MEQ tablet Take 1 tablet (10 mEq  total) by mouth daily. 30 tablet 0  . pravastatin (PRAVACHOL) 80 MG tablet Take 1 tablet (80 mg total) by mouth daily. 30 tablet 0  . spironolactone (ALDACTONE) 25 MG tablet Take 1 tablet (25 mg total) by mouth daily. 30 tablet 3  . TRUEPLUS LANCETS 30G MISC 1 each by Does not apply route every morning. 50 each 11   No current facility-administered medications for this encounter.    Filed Vitals:   01/29/15 1534  BP: 158/90  Pulse: 91  Weight: 311 lb 6 oz (141.239 kg)  SpO2: 97%   PHYSICAL EXAM: General:  Well appearing. No resp difficulty. Ambulated in the clinic without difficulty HEENT: normal Neck: supple. JVP flat. Carotids 2+ bilaterally; no bruits. No lymphadenopathy or thryomegaly  appreciated. Cor: PMI normal. Regular rate & rhythm. No rubs, gallops or murmurs. Lungs: clear Abdomen: obese, soft, nontender, nondistended. No hepatosplenomegaly. No bruits or masses. Good bowel sounds. Extremities: no cyanosis, clubbing, rash, edema Neuro: alert & orientedx3, cranial nerves grossly intact. Moves all 4 extremities w/o difficulty. Affect pleasant.   ASSESSMENT & PLAN:  1) Chronic systolic HF: NICM, EF 71% 6/16 echo.  He had been off all his medications.  These have now been restarted.  ?Hypertensive cardiomyopathy.  NYHA class II-  III symptoms, he is not volume overloaded. Has been out of carvedilol for the last 4 days. Restart 12.5 mg twice a day today.  - Continue Lasix 40 mg daily.   - Stop lisinopril and allow 36 hour wash out. Start Entresto  49-51 mg twice a day.  BMET in 10 days.  - Continue hydralazaine and imdur at current dose. Can not afford bidil.  - Lengthy discussion about medication compliance.  -- Reinforced the need and importance of daily weights, a low sodium diet, and fluid restriction (less than 2 L a day). Instructed to call the HF clinic if weight increases more than 3 lbs overnight or 5 lbs in a week.  2) HTN:  Elevated but has not had coreg in 4 days. Continue current meds.      3) OSA: Uses CPAP.   4) CKD: BMET reviewed  6/24.   Follow up in 3 weeks.  Check BMET in 10 days. Plan to repeat ECHO after he has been on meds for 3 months.  VernonAMY NP-C 01/29/2015  Patient seen and examined with Vernon Grinder, NP. We discussed all aspects of the encounter. I agree with the assessment and plan as stated above.   NYHA II despite persistent severe LV dysfunction. Noncompliance remains a major issue. Stressed need to take meds as prescribed. Will resume carvedilol and switch lisinopril to Entresto. He wants to defer ICD evaluation at this time. Understands risk of SCD.  Bensimhon, Daniel,MD 11:32 PM

## 2015-02-07 ENCOUNTER — Other Ambulatory Visit (HOSPITAL_COMMUNITY): Payer: Self-pay

## 2015-02-27 ENCOUNTER — Inpatient Hospital Stay (HOSPITAL_COMMUNITY): Admission: RE | Admit: 2015-02-27 | Payer: Self-pay | Source: Ambulatory Visit

## 2015-03-05 ENCOUNTER — Other Ambulatory Visit (HOSPITAL_COMMUNITY): Payer: Self-pay | Admitting: *Deleted

## 2015-03-05 DIAGNOSIS — I509 Heart failure, unspecified: Secondary | ICD-10-CM

## 2015-03-05 MED ORDER — CARVEDILOL 12.5 MG PO TABS: 12.5000 mg | ORAL_TABLET | Freq: Two times a day (BID) | ORAL | Status: DC

## 2015-03-05 MED ORDER — FUROSEMIDE 40 MG PO TABS: 40.0000 mg | ORAL_TABLET | Freq: Every day | ORAL | Status: DC

## 2015-03-05 MED ORDER — HYDRALAZINE HCL 50 MG PO TABS: 75.0000 mg | ORAL_TABLET | Freq: Three times a day (TID) | ORAL | Status: DC

## 2015-03-05 MED ORDER — OMEPRAZOLE 20 MG PO CPDR: 20.0000 mg | DELAYED_RELEASE_CAPSULE | Freq: Every day | ORAL | Status: DC

## 2015-03-05 MED ORDER — SACUBITRIL-VALSARTAN 49-51 MG PO TABS: 1.0000 | ORAL_TABLET | Freq: Two times a day (BID) | ORAL | Status: DC

## 2015-03-05 MED ORDER — PRAVASTATIN SODIUM 80 MG PO TABS: 80.0000 mg | ORAL_TABLET | Freq: Every day | ORAL | Status: DC

## 2015-03-05 MED ORDER — POTASSIUM CHLORIDE ER 10 MEQ PO TBCR
10.0000 meq | EXTENDED_RELEASE_TABLET | Freq: Every day | ORAL | Status: DC
Start: 2015-03-05 — End: 2015-05-15

## 2015-03-05 MED ORDER — SPIRONOLACTONE 25 MG PO TABS: 25.0000 mg | ORAL_TABLET | Freq: Every day | ORAL | Status: DC

## 2015-03-19 ENCOUNTER — Telehealth (HOSPITAL_COMMUNITY): Payer: Self-pay | Admitting: Infectious Diseases

## 2015-03-19 DIAGNOSIS — I5022 Chronic systolic (congestive) heart failure: Secondary | ICD-10-CM

## 2015-03-19 DIAGNOSIS — I10 Essential (primary) hypertension: Secondary | ICD-10-CM

## 2015-03-19 MED ORDER — ISOSORBIDE MONONITRATE ER 60 MG PO TB24: 60.0000 mg | ORAL_TABLET | Freq: Every day | ORAL | Status: DC

## 2015-03-19 NOTE — Telephone Encounter (Signed)
Call received from Mr. Aldi re: need for prescription refill for Entresto and Isosorbide. Checked with Cicero Duck, PharmD re: Sherryll Burger and we are waiting for him to bring his 18 Tax Returns before we can process the patient assistance paperwork. He was given 2 samples at the end of July with expectation he would be back on 8/3 for an appointment. He was a no show for 02/07/15 appt and for 02/27/15 rescheduled appt. Called him and LVM regarding the above information and requested call back. Can provide another sample if he is out of his medication, of which I believe he is at this time.  Isosorbide refill request sent to requested pharmacy at [facility].

## 2015-04-24 ENCOUNTER — Telehealth (HOSPITAL_COMMUNITY): Payer: Self-pay | Admitting: Vascular Surgery

## 2015-04-24 NOTE — Telephone Encounter (Signed)
Pt called he needs all HF  meds refilled and he cant afford the Entrusto he wants to know if he can get samples.. Please advise

## 2015-05-01 NOTE — Telephone Encounter (Signed)
Called patient and LVMTCB.  Need to know if he still needs his HF medications refilled and still needs samples of Entresto.

## 2015-05-15 ENCOUNTER — Telehealth (HOSPITAL_COMMUNITY): Payer: Self-pay

## 2015-05-15 DIAGNOSIS — I10 Essential (primary) hypertension: Secondary | ICD-10-CM

## 2015-05-15 DIAGNOSIS — I5022 Chronic systolic (congestive) heart failure: Secondary | ICD-10-CM

## 2015-05-15 DIAGNOSIS — I509 Heart failure, unspecified: Secondary | ICD-10-CM

## 2015-05-15 MED ORDER — POTASSIUM CHLORIDE ER 10 MEQ PO TBCR
10.0000 meq | EXTENDED_RELEASE_TABLET | Freq: Every day | ORAL | Status: DC
Start: 2015-05-15 — End: 2015-06-22

## 2015-05-15 MED ORDER — ISOSORBIDE MONONITRATE ER 60 MG PO TB24: 60.0000 mg | ORAL_TABLET | Freq: Every day | ORAL | Status: DC

## 2015-05-15 MED ORDER — SACUBITRIL-VALSARTAN 49-51 MG PO TABS: 1.0000 | ORAL_TABLET | Freq: Two times a day (BID) | ORAL | Status: DC

## 2015-05-15 MED ORDER — SPIRONOLACTONE 25 MG PO TABS: 25.0000 mg | ORAL_TABLET | Freq: Every day | ORAL | Status: DC

## 2015-05-15 MED ORDER — ASPIRIN 81 MG PO TBEC: 81.0000 mg | DELAYED_RELEASE_TABLET | Freq: Every day | ORAL | Status: DC

## 2015-05-15 MED ORDER — CARVEDILOL 12.5 MG PO TABS: 12.5000 mg | ORAL_TABLET | Freq: Two times a day (BID) | ORAL | Status: DC

## 2015-05-15 MED ORDER — FUROSEMIDE 40 MG PO TABS: 40.0000 mg | ORAL_TABLET | Freq: Every day | ORAL | Status: DC

## 2015-05-15 MED ORDER — HYDRALAZINE HCL 50 MG PO TABS: 75.0000 mg | ORAL_TABLET | Freq: Three times a day (TID) | ORAL | Status: DC

## 2015-05-15 NOTE — Telephone Encounter (Addendum)
LVMTCB about refilling medications; refills sent in for cardiac medications to [facility]

## 2015-05-16 MED ORDER — PRAVASTATIN SODIUM 80 MG PO TABS: 80.0000 mg | ORAL_TABLET | Freq: Every day | ORAL | Status: DC

## 2015-05-16 NOTE — Telephone Encounter (Signed)
Patient said that he did not receive his Entresto from the pharmacy that was sent in.  Needs to have samples sent over for him to pick up.  Reviewed patient's chart.  Was to bring over paper work for [employer] at a earlier date Routing to [place].

## 2015-05-16 NOTE — Telephone Encounter (Signed)
Called patient and left message for him to bring a tax form, something from [facility] or income verification statement of some type so we can get him his samples and enroll him in the [employer] Program for his prescription medication

## 2015-06-08 IMAGING — CR DG CHEST 2V
2 series · 2 of 2 positions shown · non-contrast
Comparison: 12/27/2013

CLINICAL DATA: Chest tightness and dyspnea

EXAM:
CHEST  2 VIEW

[chest pa]
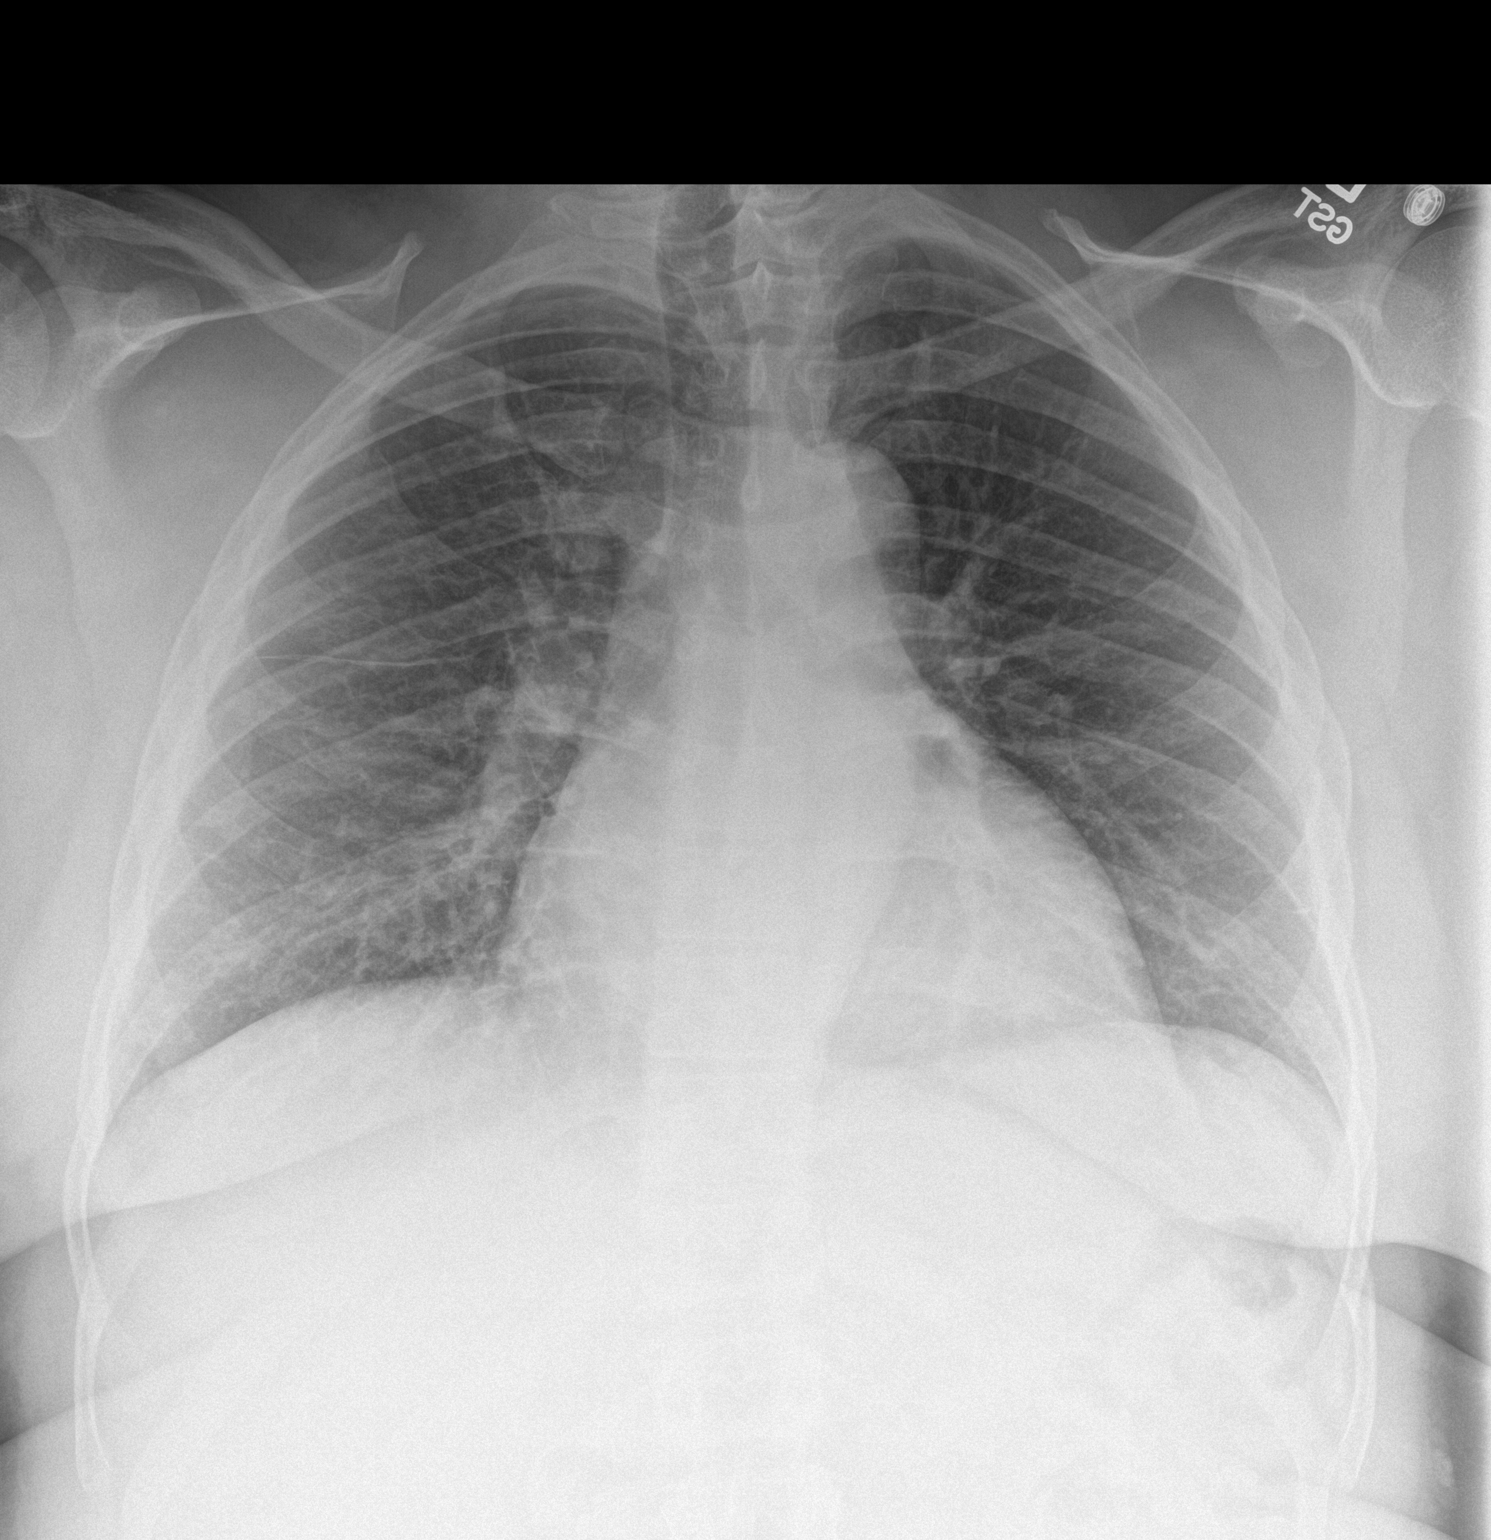

[chest lat]
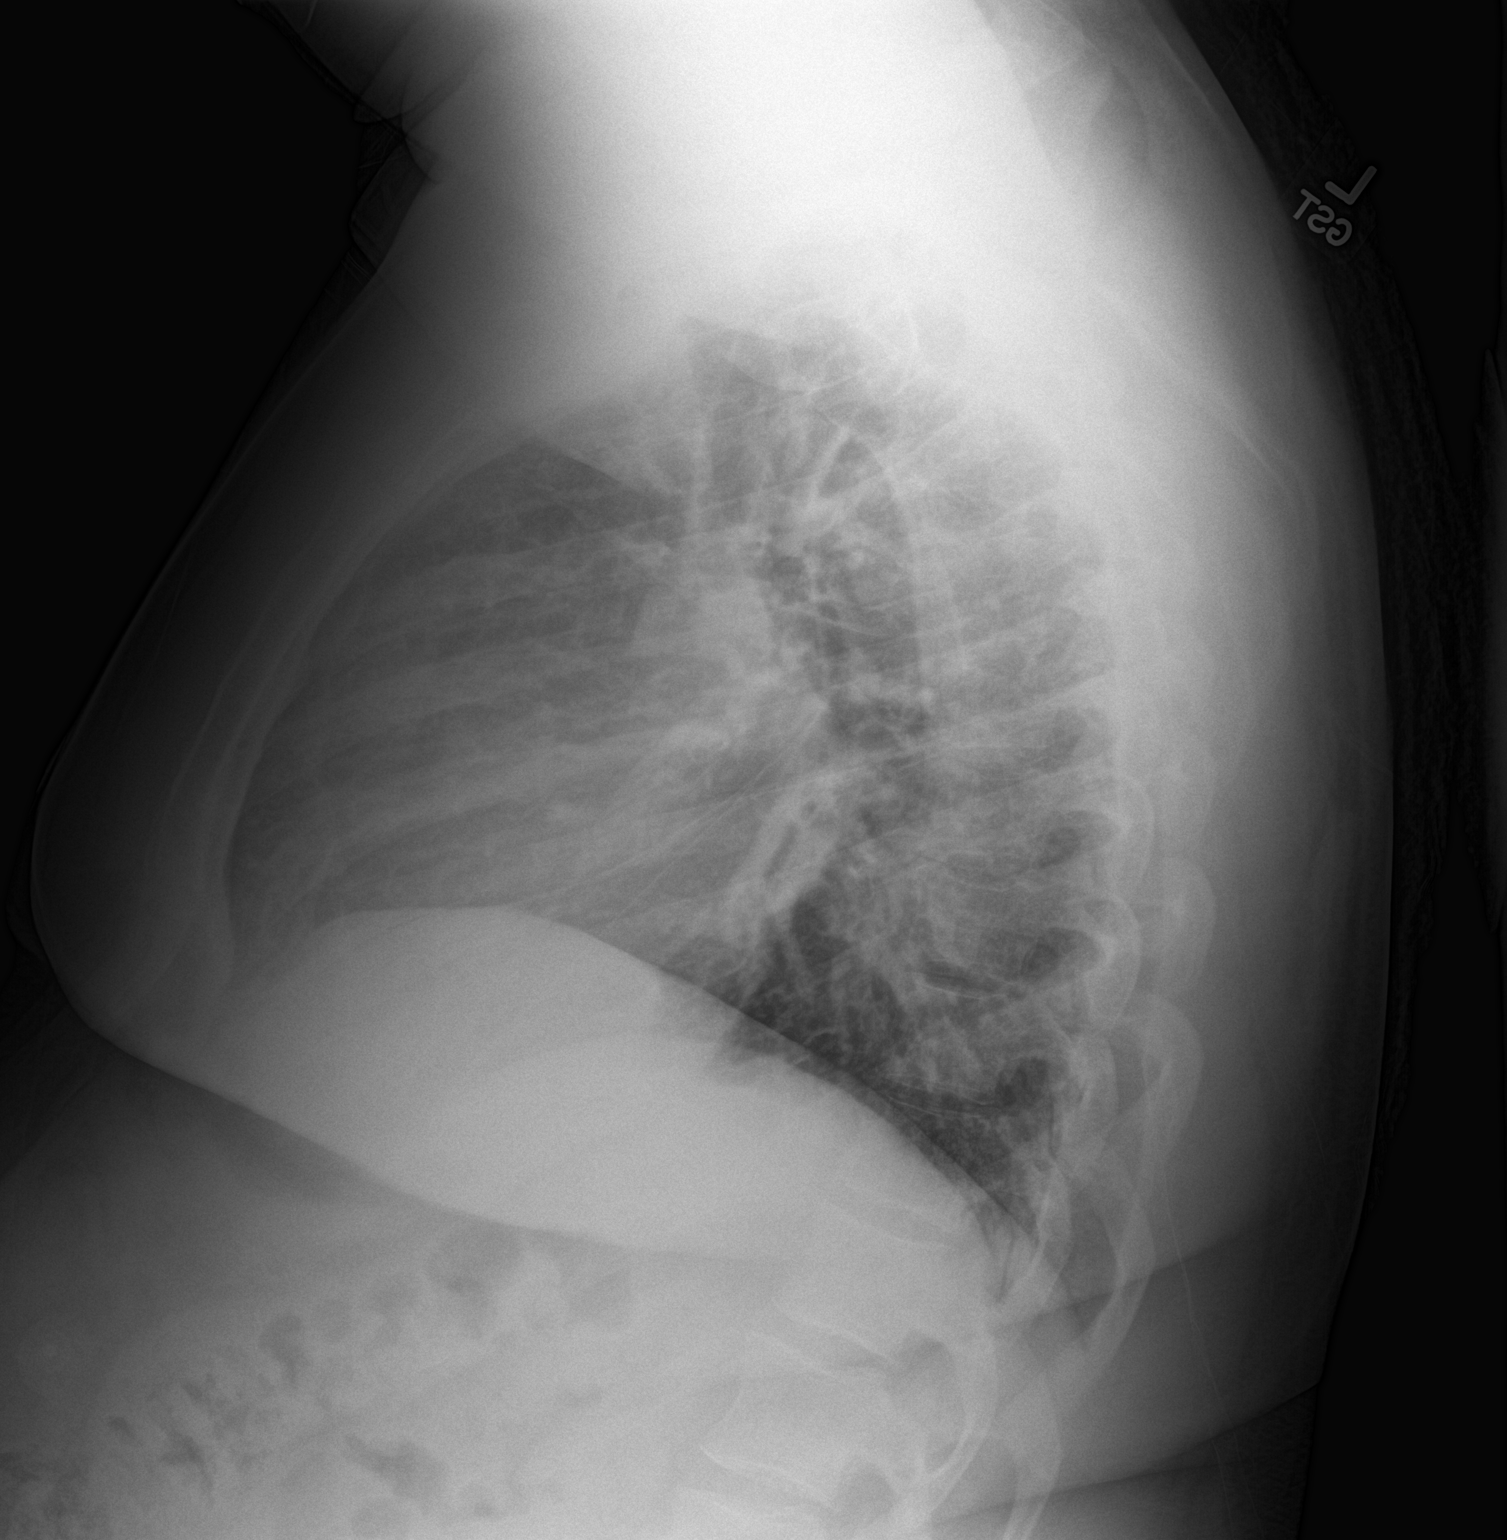

[2 of 2 positions shown; findings below may reference images not displayed]

FINDINGS: There is unchanged cardiomegaly and mild aortic tortuosity. The
lungs are clear. The pulmonary vasculature is normal. There are no
pleural effusions. Hilar, mediastinal and cardiac contours appear
unremarkable and unchanged.
IMPRESSION: Cardiomegaly.  No acute findings.

## 2015-06-11 ENCOUNTER — Other Ambulatory Visit (HOSPITAL_COMMUNITY): Payer: Self-pay

## 2015-06-11 ENCOUNTER — Telehealth (HOSPITAL_COMMUNITY): Payer: Self-pay

## 2015-06-11 MED ORDER — PRAVASTATIN SODIUM 80 MG PO TABS: 80.0000 mg | ORAL_TABLET | Freq: Every day | ORAL | Status: DC

## 2015-06-11 NOTE — Telephone Encounter (Signed)
Pt left VM on CHF triage line that he is unable to get tax returns for [employer] patient assistance foundation, they are currently in a storage building that he is unable to access. If unable to get entresto approved, will need to go back on lisinopril for him to afford. Will forward this to CHF pharmacist erika. Also needs pravastatin refill, will send to preferred pharmacy electronically.  Ave Filter

## 2015-06-12 ENCOUNTER — Telehealth (HOSPITAL_COMMUNITY): Payer: Self-pay | Admitting: Pharmacist

## 2015-06-12 NOTE — Telephone Encounter (Signed)
Left vm for patient to call back. We can also utilize pay stubs as income verification for [facility] patient assistance application.  Vernon Reed. Bonnye Fava, PharmD, BCPS, CPP Clinical Pharmacist Pager: [phone] Phone: [phone] 06/12/2015 11:59 AM

## 2015-06-14 ENCOUNTER — Telehealth (HOSPITAL_COMMUNITY): Payer: Self-pay

## 2015-06-14 ENCOUNTER — Ambulatory Visit (HOSPITAL_COMMUNITY)
Admission: RE | Admit: 2015-06-14 | Discharge: 2015-06-14 | Disposition: A | Discharge status: Home / Self Care | Payer: Self-pay | Source: Ambulatory Visit | Attending: Internal Medicine | Admitting: Internal Medicine

## 2015-06-14 VITALS — BP 154/102 | HR 93 | Wt 291.4 lb

## 2015-06-14 DIAGNOSIS — I158 Other secondary hypertension: Secondary | ICD-10-CM

## 2015-06-14 DIAGNOSIS — N189 Chronic kidney disease, unspecified: Secondary | ICD-10-CM | POA: Insufficient documentation

## 2015-06-14 DIAGNOSIS — E119 Type 2 diabetes mellitus without complications: Secondary | ICD-10-CM | POA: Insufficient documentation

## 2015-06-14 DIAGNOSIS — R000 Tachycardia, unspecified: Secondary | ICD-10-CM

## 2015-06-14 DIAGNOSIS — I509 Heart failure, unspecified: Secondary | ICD-10-CM

## 2015-06-14 DIAGNOSIS — E1165 Type 2 diabetes mellitus with hyperglycemia: Secondary | ICD-10-CM

## 2015-06-14 DIAGNOSIS — G4733 Obstructive sleep apnea (adult) (pediatric): Secondary | ICD-10-CM | POA: Insufficient documentation

## 2015-06-14 DIAGNOSIS — I495 Sick sinus syndrome: Secondary | ICD-10-CM

## 2015-06-14 DIAGNOSIS — I110 Hypertensive heart disease with heart failure: Secondary | ICD-10-CM | POA: Insufficient documentation

## 2015-06-14 DIAGNOSIS — I5022 Chronic systolic (congestive) heart failure: Secondary | ICD-10-CM | POA: Insufficient documentation

## 2015-06-14 LAB — BASIC METABOLIC PANEL
ANION GAP: 8 (ref 5–15)
BUN: 20 mg/dL (ref 6–20)
CALCIUM: 9.4 mg/dL (ref 8.9–10.3)
CO2: 26 mmol/L (ref 22–32)
Chloride: 98 mmol/L — ABNORMAL LOW (ref 101–111)
Creatinine, Ser: 1.48 mg/dL — ABNORMAL HIGH (ref 0.61–1.24)
GFR, EST NON AFRICAN AMERICAN: 58 mL/min — AB (ref >60)
Glucose, Bld: 461 mg/dL — ABNORMAL HIGH (ref 65–99)
Potassium: 4.6 mmol/L (ref 3.5–5.1)
SODIUM: 132 mmol/L — AB (ref 135–145)

## 2015-06-14 MED ORDER — LOSARTAN POTASSIUM 25 MG PO TABS: 25.0000 mg | ORAL_TABLET | Freq: Every day | ORAL | Status: DC

## 2015-06-14 MED ORDER — METFORMIN HCL 500 MG PO TABS: 500.0000 mg | ORAL_TABLET | Freq: Two times a day (BID) | ORAL | Status: DC

## 2015-06-14 NOTE — Patient Instructions (Signed)
START Metformin 500 mg, one tab twice a day START Losartan 25 mg, one tab daily  Your physician recommends that you schedule a follow-up appointment in: 4 weeks in the NP clinic  You have been referred to [facility] and [facility] for further management of your diabetes 06/29/2015 @ 1200 noon with Dr.Amao   [phone]

## 2015-06-14 NOTE — Telephone Encounter (Signed)
Patient called CHG triage line c/o frequent urination, weakness, dizziness with standing, and loss of 30 lbs since Halloween. Oct 31--314 lbs Today--286 lbs. Has not taken any extra diuretics.  Will add on to CHF clinic schedule to evaluate/treat.  Ave Filter

## 2015-06-14 NOTE — Progress Notes (Signed)
Patient ID: Allister Lessley, male   DOB: 1976/01/14, 39 y.o.   MRN: [id number] [facility]CP[facility]   HPI: Mr. Kras is a 39 yo male with a history of HTN, HLD, OSA on CPAP, obesity and chronic systolic HF. Admitted to the hospital 1/16-1/20/15 for SOB. Had L/RHC with normal coronaries.   Admitted 6/23-6/25/15 for SOB. He was diuresed and medications restarted which he became hypotensive and were scaled back. He had AKI. Discharge weight was 310 lbs  He returns for HF follow up. Has been out of entresto 45 days. Never provided Overall feeling bad. Says he has had 20 pound weight loss over the last 6 weeks. Increased urination over the last 6 weeks. Last weight at home 286 pounds. Not following a particular diet. Denies SOB/PND/Orthopnea. 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 Does not have insurance.   RHC/LHC  07/26/13  RA 10 RV 36/10/13 PA 34/16 (23) PCWP 20124 77 Fick CO/CI 6.18/2.4 Normal Cors   ECHO 07/2013: EF 30-35% ECHO 12/28/2013: EF 45-50%, grade 1 DD.  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 No tobacco abuse or drugs.   FH: Mother living: - HTN, HLD       Father- HTN  ROS: All systems negative except as listed in HPI, PMH and Problem List.  Past Medical History  Diagnosis Date  . Hypertension   . Hyperlipemia   . CHF (congestive heart failure)   . OSA (obstructive sleep apnea)     "should wear mask but I don't have one" (12/06/2014)  . Headache     "maybe 5 times/yr; nothing regular" (12/06/2014)  . GERD (gastroesophageal reflux disease)   . Type II diabetes mellitus     Current Outpatient Prescriptions  Medication Sig Dispense Refill  . albuterol (PROVENTIL HFA;VENTOLIN HFA) 108 (90 BASE)  MCG/ACT inhaler Inhale 1-2 puffs into the lungs every 6 (six) hours as needed for wheezing or shortness of breath. 1 Inhaler 0  . aspirin 81 MG EC tablet Take 1 tablet (81 mg total) by mouth daily. 30 tablet 6  . Blood Glucose Monitoring Suppl (TRUE METRIX METER) W/DEVICE KIT 1 each by Does not apply route once as needed. 1 kit 0  . carvedilol (COREG) 12.5 MG tablet Take 1 tablet (12.5 mg total) by mouth 2 (two) times daily with a meal. 60 tablet 6  . Cholecalciferol (VITAMIN D PO) Take 1 tablet by mouth daily.    . furosemide (LASIX) 40 MG tablet Take 1 tablet (40 mg total) by mouth daily. 30 tablet 6  . glucose blood (TRUE METRIX BLOOD GLUCOSE TEST) test strip Use as instructed once daily before breakfast 50 each 12  . hydrALAZINE (APRESOLINE) 50 MG tablet Take 1.5 tablets (75 mg total) by mouth 3 (three) times daily. 135 tablet 6  . isosorbide mononitrate (IMDUR) 60 MG 24 hr tablet Take 1 tablet (60 mg total) by mouth daily. 30 tablet 6  . potassium chloride (K-DUR) 10 MEQ tablet Take 1 tablet (10 mEq total) by mouth daily. 30 tablet 6  . spironolactone (ALDACTONE) 25 MG tablet Take 1 tablet (25 mg total) by mouth daily. 30 tablet 6  . TRUEPLUS LANCETS 30G MISC 1 each by Does not apply route  every morning. 50 each 11  . pravastatin (PRAVACHOL) 80 MG tablet Take 1 tablet (80 mg total) by mouth daily. (Patient not taking: Reported on 06/14/2015) 30 tablet 6   No current facility-administered medications for this encounter.    Filed Vitals:   06/14/15 1359  BP: 154/102  Pulse: 93  Weight: 291 lb 6.4 oz (132.178 kg)  SpO2: 95%   PHYSICAL EXAM: General:  Fatigued appearing. No resp difficulty. Ambulated slowly in the clinic.  HEENT: normal Neck: supple. JVP flat. Carotids 2+ bilaterally; no bruits. No lymphadenopathy or thryomegaly appreciated. Cor: PMI normal. Regular rate & rhythm. No rubs, gallops or murmurs. Lungs: clear Abdomen: obese, soft, nontender, nondistended. No  hepatosplenomegaly. No bruits or masses. Good bowel sounds. Extremities: no cyanosis, clubbing, rash, edema Neuro: alert & orientedx3, cranial nerves grossly intact. Moves all 4 extremities w/o difficulty. Affect pleasant.  EKG: NSR 93 bpm   ASSESSMENT & PLAN:  1) Chronic systolic HF: NICM, EF 48% 6/16 echo.   H  ?Hypertensive cardiomyopathy.   NYHA class II symptoms, he is not volume overloaded but appears tired.  Continue carvedilol 12.5 mg twice a day.   - Continue Lasix 40 mg daily.   -  Stop entresto as he ran out and needs to provided proof of income for [facility] medication assistance.  . Start losartan 25 mg daily.  BMET in 10 days.  - Continue hydralazaine and imdur at current dose. Can not afford bidil.  - Lengthy discussion about medication compliance.  -- Reinforced the need and importance of daily weights, a low sodium diet, and fluid restriction (less than 2 L a day). Instructed to call the HF clinic if weight increases more than 3 lbs overnight or 5 lbs in a week.  2) HTN:  Elevated . Add 25 mg losartan. Continue current meds.      3) OSA: Uses CPAP.   4) CKD: BMET today . 5)  DMII-  CBG checked --> Glucose 436. Start metformin 500 mg twice a day. Needs follow up at [facility].  We have set up appointment. I have stressed the importance of follow up.   Follow up in 4 weeks.   Yumi Insalaco NP-C 06/14/2015

## 2015-06-14 NOTE — Progress Notes (Signed)
Advanced Heart Failure Medication Review by a Pharmacist  Does the patient  feel that his/her medications are working for him/her?  yes  Has the patient been experiencing any side effects to the medications prescribed?  no  Does the patient measure his/her own blood pressure or blood glucose at home?  no   Does the patient have any problems obtaining medications due to transportation or finances?   yes  Understanding of regimen: good Understanding of indications: good Potential of compliance: fair Patient understands to avoid NSAIDs. Patient understands to avoid decongestants.  Issues to address at subsequent visits: Entresto patient assistance   Pharmacist comments:  Mr. Kashat is a pleasant 39 yo M presenting without a medication list but with good recall of his regimen including dosages. He reports good compliance with his medications but has run out of pravastatin recently and has not been able to afford Entresto for about 1.5 months. I have asked him to bring in 30 days worth of pay stubs so that we can complete his application for assistance. He did not have any other medication-related questions or concerns for me at this time.   Tyler Deis. Bonnye Fava, PharmD, BCPS, CPP Clinical Pharmacist Pager: [phone] Phone: [phone] 06/14/2015 2:30 PM      Time with patient: 10 minutes Preparation and documentation time: 2 minutes Total time: 12 minutes

## 2015-06-15 ENCOUNTER — Encounter (HOSPITAL_BASED_OUTPATIENT_CLINIC_OR_DEPARTMENT_OTHER): Payer: Self-pay | Admitting: Emergency Medicine

## 2015-06-15 ENCOUNTER — Emergency Department (HOSPITAL_BASED_OUTPATIENT_CLINIC_OR_DEPARTMENT_OTHER)
Admission: EM | Admit: 2015-06-15 | Discharge: 2015-06-15 | Disposition: A | Discharge status: Home / Self Care | Payer: Self-pay | Attending: Emergency Medicine | Admitting: Emergency Medicine

## 2015-06-15 DIAGNOSIS — Z8669 Personal history of other diseases of the nervous system and sense organs: Secondary | ICD-10-CM | POA: Insufficient documentation

## 2015-06-15 DIAGNOSIS — Z8719 Personal history of other diseases of the digestive system: Secondary | ICD-10-CM | POA: Insufficient documentation

## 2015-06-15 DIAGNOSIS — Z7984 Long term (current) use of oral hypoglycemic drugs: Secondary | ICD-10-CM | POA: Insufficient documentation

## 2015-06-15 DIAGNOSIS — R739 Hyperglycemia, unspecified: Secondary | ICD-10-CM

## 2015-06-15 DIAGNOSIS — I509 Heart failure, unspecified: Secondary | ICD-10-CM | POA: Insufficient documentation

## 2015-06-15 DIAGNOSIS — E785 Hyperlipidemia, unspecified: Secondary | ICD-10-CM | POA: Insufficient documentation

## 2015-06-15 DIAGNOSIS — E1165 Type 2 diabetes mellitus with hyperglycemia: Secondary | ICD-10-CM | POA: Insufficient documentation

## 2015-06-15 DIAGNOSIS — Z79899 Other long term (current) drug therapy: Secondary | ICD-10-CM | POA: Insufficient documentation

## 2015-06-15 DIAGNOSIS — Z7982 Long term (current) use of aspirin: Secondary | ICD-10-CM | POA: Insufficient documentation

## 2015-06-15 DIAGNOSIS — R5383 Other fatigue: Secondary | ICD-10-CM | POA: Insufficient documentation

## 2015-06-15 DIAGNOSIS — Z9889 Other specified postprocedural states: Secondary | ICD-10-CM | POA: Insufficient documentation

## 2015-06-15 DIAGNOSIS — I10 Essential (primary) hypertension: Secondary | ICD-10-CM | POA: Insufficient documentation

## 2015-06-15 LAB — BASIC METABOLIC PANEL
Anion gap: 7 (ref 5–15)
BUN: 25 mg/dL — ABNORMAL HIGH (ref 6–20)
CO2: 26 mmol/L (ref 22–32)
Calcium: 8.8 mg/dL — ABNORMAL LOW (ref 8.9–10.3)
Chloride: 96 mmol/L — ABNORMAL LOW (ref 101–111)
Creatinine, Ser: 1.46 mg/dL — ABNORMAL HIGH (ref 0.61–1.24)
GFR calc Af Amer: >60 mL/min (ref >60)
GFR, EST NON AFRICAN AMERICAN: 59 mL/min — AB (ref >60)
GLUCOSE: 549 mg/dL — AB (ref 65–99)
POTASSIUM: 5.1 mmol/L (ref 3.5–5.1)
SODIUM: 129 mmol/L — AB (ref 135–145)

## 2015-06-15 LAB — URINALYSIS, ROUTINE W REFLEX MICROSCOPIC
BILIRUBIN URINE: NEGATIVE
Glucose, UA: >1000 mg/dL — AB
Hgb urine dipstick: NEGATIVE
KETONES UR: 15 mg/dL — AB
Leukocytes, UA: NEGATIVE
NITRITE: NEGATIVE
Protein, ur: NEGATIVE mg/dL
Specific Gravity, Urine: 1.015 (ref 1.005–1.030)
pH: 5 (ref 5.0–8.0)

## 2015-06-15 LAB — CBC WITH DIFFERENTIAL/PLATELET
BASOS ABS: 0 10*3/uL (ref 0.0–0.1)
Basophils Relative: 0 %
Eosinophils Absolute: 0.2 10*3/uL (ref 0.0–0.7)
Eosinophils Relative: 2 %
HEMATOCRIT: 39.5 % (ref 39.0–52.0)
Hemoglobin: 13.3 g/dL (ref 13.0–17.0)
LYMPHS PCT: 23 %
Lymphs Abs: 2.1 10*3/uL (ref 0.7–4.0)
MCH: 29.8 pg (ref 26.0–34.0)
MCHC: 33.7 g/dL (ref 30.0–36.0)
MCV: 88.6 fL (ref 78.0–100.0)
Monocytes Absolute: 0.6 10*3/uL (ref 0.1–1.0)
Monocytes Relative: 7 %
Neutro Abs: 6.3 10*3/uL (ref 1.7–7.7)
Neutrophils Relative %: 68 %
Platelets: 252 10*3/uL (ref 150–400)
RBC: 4.46 MIL/uL (ref 4.22–5.81)
RDW: 12.2 % (ref 11.5–15.5)
WBC: 9.2 10*3/uL (ref 4.0–10.5)

## 2015-06-15 LAB — URINE MICROSCOPIC-ADD ON: RBC / HPF: NONE SEEN RBC/hpf (ref 0–5)

## 2015-06-15 LAB — CBG MONITORING, ED
GLUCOSE-CAPILLARY: 469 mg/dL — AB (ref 65–99)
Glucose-Capillary: 456 mg/dL — ABNORMAL HIGH (ref 65–99)
Glucose-Capillary: 391 mg/dL — ABNORMAL HIGH (ref 65–99)

## 2015-06-15 LAB — HEMOGLOBIN A1C
Hgb A1c MFr Bld: 12.3 % — ABNORMAL HIGH (ref 4.8–5.6)
MEAN PLASMA GLUCOSE: 306 mg/dL

## 2015-06-15 MED ORDER — INSULIN REGULAR HUMAN 100 UNIT/ML IJ SOLN
10.0000 [IU] | Freq: Once | INTRAMUSCULAR | Status: AC
  Administered 2015-06-15: 10 [IU] via SUBCUTANEOUS
  Filled 2015-06-15: qty 1

## 2015-06-15 MED ORDER — SODIUM CHLORIDE 0.9 % IV BOLUS (SEPSIS)
500.0000 mL | Freq: Once | INTRAVENOUS | Status: AC
  Administered 2015-06-15: 500 mL via INTRAVENOUS

## 2015-06-15 NOTE — Discharge Instructions (Signed)
Hyperglycemia °Hyperglycemia occurs when the glucose (sugar) in your blood is too high. Hyperglycemia can happen for many reasons, but it most often happens to people who do not know they have diabetes or are not managing their diabetes properly.  °CAUSES  °Whether you have diabetes or not, there are other causes of hyperglycemia. Hyperglycemia can occur when you have diabetes, but it can also occur in other situations that you might not be as aware of, such as: °Diabetes °· If you have diabetes and are having problems controlling your blood glucose, hyperglycemia could occur because of some of the following reasons: °· Not following your meal plan. °· Not taking your diabetes medications or not taking it properly. °· Exercising less or doing less activity than you normally do. °· Being sick. °Pre-diabetes °· This cannot be ignored. Before people develop Type 2 diabetes, they almost always have "pre-diabetes." This is when your blood glucose levels are higher than normal, but not yet high enough to be diagnosed as diabetes. Research has shown that some long-term damage to the body, especially the heart and circulatory system, may already be occurring during pre-diabetes. If you take action to manage your blood glucose when you have pre-diabetes, you may delay or prevent Type 2 diabetes from developing. °Stress °· If you have diabetes, you may be "diet" controlled or on oral medications or insulin to control your diabetes. However, you may find that your blood glucose is higher than usual in the hospital whether you have diabetes or not. This is often referred to as "stress hyperglycemia." Stress can elevate your blood glucose. This happens because of hormones put out by the body during times of stress. If stress has been the cause of your high blood glucose, it can be followed regularly by your caregiver. That way he/she can make sure your hyperglycemia does not continue to get worse or progress to  diabetes. °Steroids °· Steroids are medications that act on the infection fighting system (immune system) to block inflammation or infection. One side effect can be a rise in blood glucose. Most people can produce enough extra insulin to allow for this rise, but for those who cannot, steroids make blood glucose levels go even higher. It is not unusual for steroid treatments to "uncover" diabetes that is developing. It is not always possible to determine if the hyperglycemia will go away after the steroids are stopped. A special blood test called an A1c is sometimes done to determine if your blood glucose was elevated before the steroids were started. °SYMPTOMS °· Thirsty. °· Frequent urination. °· Dry mouth. °· Blurred vision. °· Tired or fatigue. °· Weakness. °· Sleepy. °· Tingling in feet or leg. °DIAGNOSIS  °Diagnosis is made by monitoring blood glucose in one or all of the following ways: °· A1c test. This is a chemical found in your blood. °· Fingerstick blood glucose monitoring. °· Laboratory results. °TREATMENT  °First, knowing the cause of the hyperglycemia is important before the hyperglycemia can be treated. Treatment may include, but is not be limited to: °· Education. °· Change or adjustment in medications. °· Change or adjustment in meal plan. °· Treatment for an illness, infection, etc. °· More frequent blood glucose monitoring. °· Change in exercise plan. °· Decreasing or stopping steroids. °· Lifestyle changes. °HOME CARE INSTRUCTIONS  °· Test your blood glucose as directed. °· Exercise regularly. Your caregiver will give you instructions about exercise. Pre-diabetes or diabetes which comes on with stress is helped by exercising. °· Eat wholesome,   balanced meals. Eat often and at regular, fixed times. Your caregiver or nutritionist will give you a meal plan to guide your sugar intake. °· Being at an ideal weight is important. If needed, losing as little as 10 to 15 pounds may help improve blood  glucose levels. °SEEK MEDICAL CARE IF:  °· You have questions about medicine, activity, or diet. °· You continue to have symptoms (problems such as increased thirst, urination, or weight gain). °SEEK IMMEDIATE MEDICAL CARE IF:  °· You are vomiting or have diarrhea. °· Your breath smells fruity. °· You are breathing faster or slower. °· You are very sleepy or incoherent. °· You have numbness, tingling, or pain in your feet or hands. °· You have chest pain. °· Your symptoms get worse even though you have been following your caregiver's orders. °· If you have any other questions or concerns. °  °This information is not intended to replace advice given to you by your health care provider. Make sure you discuss any questions you have with your health care provider. °  °Document Released: 12/17/2000 Document Revised: 09/15/2011 Document Reviewed: 02/27/2015 °Elsevier Interactive Patient Education ©2016 [employer] °Diabetes Mellitus and Food °It is important for you to manage your blood sugar (glucose) level. Your blood glucose level can be greatly affected by what you eat. Eating healthier foods in the appropriate amounts throughout the day at about the same time each day will help you control your blood glucose level. It can also help slow or prevent worsening of your diabetes mellitus. Healthy eating may even help you improve the level of your blood pressure and reach or maintain a healthy weight.  °General recommendations for healthful eating and cooking habits include: °· Eating meals and snacks regularly. Avoid going long periods of time without eating to lose weight. °· Eating a diet that consists mainly of plant-based foods, such as fruits, vegetables, nuts, legumes, and whole grains. °· Using low-heat cooking methods, such as baking, instead of high-heat cooking methods, such as deep frying. °Work with your dietitian to make sure you understand how to use the Nutrition Facts information on food labels. °HOW CAN  FOOD AFFECT ME? °Carbohydrates °Carbohydrates affect your blood glucose level more than any other type of food. Your dietitian will help you determine how many carbohydrates to eat at each meal and teach you how to count carbohydrates. Counting carbohydrates is important to keep your blood glucose at a healthy level, especially if you are using insulin or taking certain medicines for diabetes mellitus. °Alcohol °Alcohol can cause sudden decreases in blood glucose (hypoglycemia), especially if you use insulin or take certain medicines for diabetes mellitus. Hypoglycemia can be a life-threatening condition. Symptoms of hypoglycemia (sleepiness, dizziness, and disorientation) are similar to symptoms of having too much alcohol.  °If your health care provider has given you approval to drink alcohol, do so in moderation and use the following guidelines: °· Women should not have more than one drink per day, and men should not have more than two drinks per day. One drink is equal to: °¨ 12 oz of beer. °¨ 5 oz of wine. °¨ 1½ oz of hard liquor. °· Do not drink on an empty stomach. °· Keep yourself hydrated. Have water, diet soda, or unsweetened iced tea. °· Regular soda, juice, and other mixers might contain a lot of carbohydrates and should be counted. °WHAT FOODS ARE NOT RECOMMENDED? °As you make food choices, it is important to remember that all foods are not the   same. Some foods have fewer nutrients per serving than other foods, even though they might have the same number of calories or carbohydrates. It is difficult to get your body what it needs when you eat foods with fewer nutrients. Examples of foods that you should avoid that are high in calories and carbohydrates but low in nutrients include: °· Trans fats (most processed foods list trans fats on the Nutrition Facts label). °· Regular soda. °· Juice. °· Candy. °· Sweets, such as cake, pie, doughnuts, and cookies. °· Fried foods. °WHAT FOODS CAN I EAT? °Eat  nutrient-rich foods, which will nourish your body and keep you healthy. The food you should eat also will depend on several factors, including: °· The calories you need. °· The medicines you take. °· Your weight. °· Your blood glucose level. °· Your blood pressure level. °· Your cholesterol level. °You should eat a variety of foods, including: °· Protein. °¨ Lean cuts of meat. °¨ Proteins low in saturated fats, such as fish, egg whites, and beans. Avoid processed meats. °· Fruits and vegetables. °¨ Fruits and vegetables that may help control blood glucose levels, such as apples, mangoes, and yams. °· Dairy products. °¨ Choose fat-free or low-fat dairy products, such as milk, yogurt, and cheese. °· Grains, bread, pasta, and rice. °¨ Choose whole grain products, such as multigrain bread, whole oats, and brown rice. These foods may help control blood pressure. °· Fats. °¨ Foods containing healthful fats, such as nuts, avocado, olive oil, canola oil, and fish. °DOES EVERYONE WITH DIABETES MELLITUS HAVE THE SAME MEAL PLAN? °Because every person with diabetes mellitus is different, there is not one meal plan that works for everyone. It is very important that you meet with a dietitian who will help you create a meal plan that is just right for you. °  °This information is not intended to replace advice given to you by your health care provider. Make sure you discuss any questions you have with your health care provider. °  °Document Released: 03/20/2005 Document Revised: 07/14/2014 Document Reviewed: 05/20/2013 °Elsevier Interactive Patient Education ©2016 [employer] ° °

## 2015-06-15 NOTE — ED Notes (Signed)
Extensive education given on diabetes to patient and his girlfriend including written information.  Pt and girlfriend gave verbal under standing.

## 2015-06-15 NOTE — ED Notes (Signed)
Pt has been having lethargy, fatigue, blurry vision, thirst and frequent urination for several weeks.  Weight loss since October.  Pt seen at md office yesterday, blood sugar was over 400 and was put on metformin.  Pt has appt for pcp on December 23rd.

## 2015-06-15 NOTE — ED Notes (Signed)
Pt alert and smiling, conversational laying in bed.  Pt states he is comfortable and "may try to take a nap."  TV turned on for patient comfort.  Pt continues to sip diet gingerale at bedside.

## 2015-06-15 NOTE — ED Provider Notes (Signed)
CSN: [id number]     Arrival date & time 06/15/15  0945 History   First MD Initiated Contact with Patient 06/15/15 1002     Chief Complaint  Patient presents with  . Hyperglycemia     Patient is a 39 y.o. male presenting with hyperglycemia. The history is provided by the patient. No language interpreter was used.  Hyperglycemia  Golden Gilreath is a 39 y.o. male who presents to the Emergency Department complaining of malaise.  Reports generalized fatigue, malaise, not feeling well since yesterday. He had a doctor's appointment yesterday and was told he had an elevated blood sugar and was started on metformin. His first dose was this morning around 9:00. He states around 08 or 0830 he began feeling very poorly and thought his sugar was high. He reports excessive thirst and urination. He was seen by cardiology yesterday for congestive heart failure follow-up. He denies any chest pain, shortness of breath, leg swelling, abdominal pain, vomiting.   Past Medical History  Diagnosis Date  . Hypertension   . Hyperlipemia   . CHF (congestive heart failure)   . OSA (obstructive sleep apnea)     "should wear mask but I don't have one" (12/06/2014)  . Headache     "maybe 5 times/yr; nothing regular" (12/06/2014)  . GERD (gastroesophageal reflux disease)   . Type II diabetes mellitus    Past Surgical History  Procedure Laterality Date  . Abdominal surgery  ~ 1987    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  . Left and right heart catheterization with coronary angiogram N/A 07/26/2013    Procedure: LEFT AND RIGHT HEART CATHETERIZATION WITH CORONARY ANGIOGRAM;  Surgeon: Leonie Man, MD;  Location: [facility] CATH LAB;  Service: Cardiovascular;  Laterality: N/A;  . Appendectomy  ~ 1989  . Cardiac catheterization  07/2013   Family History  Problem Relation Age of Onset  . Hypertension Mother   . Hypertension Father   . Sleep apnea Father    Social History  Substance Use Topics  . Smoking status: Never  Smoker   . Smokeless tobacco: Never Used  . Alcohol Use: No     Comment: 12/06/2014 "might have a beer a couple times/month"    Review of Systems  All other systems reviewed and are negative.     Allergies  Review of patient's allergies indicates no known allergies.  Home Medications   Prior to Admission medications   Medication Sig Start Date End Date Taking? Authorizing Provider  albuterol (PROVENTIL HFA;VENTOLIN HFA) 108 (90 BASE) MCG/ACT inhaler Inhale 1-2 puffs into the lungs every 6 (six) hours as needed for wheezing or shortness of breath. 08/22/14   April Palumbo, MD  aspirin 81 MG EC tablet Take 1 tablet (81 mg total) by mouth daily. 05/15/15   Jolaine Artist, MD  Blood Glucose Monitoring Suppl (TRUE METRIX METER) W/DEVICE KIT 1 each by Does not apply route once as needed. 12/11/14   Arnoldo Morale, MD  carvedilol (COREG) 12.5 MG tablet Take 1 tablet (12.5 mg total) by mouth 2 (two) times daily with a meal. 05/15/15   Jolaine Artist, MD  Cholecalciferol (VITAMIN D PO) Take 1 tablet by mouth daily.    Historical Provider, MD  furosemide (LASIX) 40 MG tablet Take 1 tablet (40 mg total) by mouth daily. 05/15/15   Jolaine Artist, MD  glucose blood (TRUE METRIX BLOOD GLUCOSE TEST) test strip Use as instructed once daily before breakfast 12/11/14   Arnoldo Morale, MD  hydrALAZINE (APRESOLINE) 50 MG tablet Take 1.5 tablets (75 mg total) by mouth 3 (three) times daily. 05/15/15   Jolaine Artist, MD  isosorbide mononitrate (IMDUR) 60 MG 24 hr tablet Take 1 tablet (60 mg total) by mouth daily. 05/15/15   Jolaine Artist, MD  losartan (COZAAR) 25 MG tablet Take 1 tablet (25 mg total) by mouth daily. 06/14/15   Amy D Ninfa Meeker, NP  metFORMIN (GLUCOPHAGE) 500 MG tablet Take 1 tablet (500 mg total) by mouth 2 (two) times daily with a meal. 06/14/15   Amy D Clegg, NP  potassium chloride (K-DUR) 10 MEQ tablet Take 1 tablet (10 mEq total) by mouth daily. 05/15/15   Jolaine Artist, MD   pravastatin (PRAVACHOL) 80 MG tablet Take 1 tablet (80 mg total) by mouth daily. Patient not taking: Reported on 06/14/2015 06/11/15   Jolaine Artist, MD  spironolactone (ALDACTONE) 25 MG tablet Take 1 tablet (25 mg total) by mouth daily. 05/15/15   Jolaine Artist, MD  TRUEPLUS LANCETS 30G MISC 1 each by Does not apply route every morning. 12/11/14   Arnoldo Morale, MD   BP 118/73 mmHg  Pulse 89  Temp(Src) 97.9 F (36.6 C) (Oral)  Resp 18  Ht 6' (1.829 m)  Wt 290 lb (131.543 kg)  BMI 39.32 kg/m2  SpO2 98% Physical Exam  Constitutional: He is oriented to person, place, and time. He appears well-developed and well-nourished.  HENT:  Head: Normocephalic and atraumatic.  Cardiovascular: Normal rate and regular rhythm.   No murmur heard. Pulmonary/Chest: Effort normal and breath sounds normal. No respiratory distress.  Abdominal: Soft. There is no tenderness. There is no rebound and no guarding.  Musculoskeletal: He exhibits no edema or tenderness.  Neurological: He is alert and oriented to person, place, and time.  Skin: Skin is warm and dry.  Psychiatric: He has a normal mood and affect. His behavior is normal.  Nursing note and vitals reviewed.   ED Course  Procedures (including critical care time) Labs Review Labs Reviewed  URINALYSIS, ROUTINE W REFLEX MICROSCOPIC (NOT AT [facility]) - Abnormal; Notable for the following:    Glucose, UA >1000 (*)    Ketones, ur 15 (*)    All other components within normal limits  URINE MICROSCOPIC-ADD ON - Abnormal; Notable for the following:    Squamous Epithelial / LPF 0-5 (*)    Bacteria, UA RARE (*)    Casts HYALINE CASTS (*)    All other components within normal limits  BASIC METABOLIC PANEL - Abnormal; Notable for the following:    Sodium 129 (*)    Chloride 96 (*)    Glucose, Bld 549 (*)    BUN 25 (*)    Creatinine, Ser 1.46 (*)    Calcium 8.8 (*)    GFR calc non Af Amer 59 (*)    All other components within normal limits  CBG  MONITORING, ED - Abnormal; Notable for the following:    Glucose-Capillary 469 (*)    All other components within normal limits  CBG MONITORING, ED - Abnormal; Notable for the following:    Glucose-Capillary 456 (*)    All other components within normal limits  CBG MONITORING, ED - Abnormal; Notable for the following:    Glucose-Capillary 391 (*)    All other components within normal limits  CBC WITH DIFFERENTIAL/PLATELET    Imaging Review No results found. I have personally reviewed and evaluated these images and lab results as part of my medical  decision-making.   EKG Interpretation None      MDM   Final diagnoses:  Hyperglycemia    Patient here for evaluation of elevated blood sugar, malaise. He was diagnosed with diabetes yesterday and started on metformin this morning. He is nontoxic appearing on examination. He is feeling improved after small fluid bolus and insulin in the department. Blood sugars are improving. There is no evidence of DKA. His labs do demonstrate mild dehydration compared to BMP that was checked yesterday. Discussed with patient home care for diabetes with diabetic diet, also discussed congestive heart failure and fluid balance.    Quintella Reichert, MD 06/15/15 [phone]

## 2015-06-18 ENCOUNTER — Telehealth (HOSPITAL_COMMUNITY): Payer: Self-pay | Admitting: Cardiology

## 2015-06-18 DIAGNOSIS — I509 Heart failure, unspecified: Secondary | ICD-10-CM

## 2015-06-18 NOTE — Telephone Encounter (Signed)
Recent office visit and Er visit for elevated blood sugar Pt wouuld like to know if he should increase his Metformin or should he do anything else to help his blood sugars   Pt advised to not adjust medications, pt should called PCP office to move appointment up, check for cancellations, or speak to triage for further evaluation of high glucose  Pt voiced understanding

## 2015-06-22 ENCOUNTER — Telehealth (HOSPITAL_COMMUNITY): Payer: Self-pay | Admitting: *Deleted

## 2015-06-22 DIAGNOSIS — I509 Heart failure, unspecified: Secondary | ICD-10-CM

## 2015-06-22 DIAGNOSIS — I10 Essential (primary) hypertension: Secondary | ICD-10-CM

## 2015-06-22 DIAGNOSIS — I5022 Chronic systolic (congestive) heart failure: Secondary | ICD-10-CM

## 2015-06-22 MED ORDER — SPIRONOLACTONE 25 MG PO TABS: 25.0000 mg | ORAL_TABLET | Freq: Every day | ORAL | Status: DC

## 2015-06-22 MED ORDER — FUROSEMIDE 40 MG PO TABS: 40.0000 mg | ORAL_TABLET | Freq: Every day | ORAL | Status: DC

## 2015-06-22 MED ORDER — ISOSORBIDE MONONITRATE ER 60 MG PO TB24: 60.0000 mg | ORAL_TABLET | Freq: Every day | ORAL | Status: DC

## 2015-06-22 MED ORDER — CARVEDILOL 12.5 MG PO TABS: 12.5000 mg | ORAL_TABLET | Freq: Two times a day (BID) | ORAL | Status: DC

## 2015-06-22 MED ORDER — POTASSIUM CHLORIDE ER 10 MEQ PO TBCR: 10.0000 meq | EXTENDED_RELEASE_TABLET | Freq: Every day | ORAL | Status: DC

## 2015-06-22 NOTE — Telephone Encounter (Signed)
Pt called needed refills sent in, done

## 2015-06-29 ENCOUNTER — Ambulatory Visit: Payer: Self-pay | Admitting: Family Medicine

## 2015-07-12 ENCOUNTER — Encounter (HOSPITAL_COMMUNITY): Payer: Self-pay

## 2015-07-16 ENCOUNTER — Telehealth (HOSPITAL_COMMUNITY): Payer: Self-pay

## 2015-07-16 DIAGNOSIS — I5022 Chronic systolic (congestive) heart failure: Secondary | ICD-10-CM

## 2015-07-16 DIAGNOSIS — I509 Heart failure, unspecified: Secondary | ICD-10-CM

## 2015-07-16 DIAGNOSIS — I10 Essential (primary) hypertension: Secondary | ICD-10-CM

## 2015-07-16 MED ORDER — FUROSEMIDE 40 MG PO TABS: 40.0000 mg | ORAL_TABLET | Freq: Every day | ORAL | Status: DC

## 2015-07-16 MED ORDER — CARVEDILOL 12.5 MG PO TABS: 12.5000 mg | ORAL_TABLET | Freq: Two times a day (BID) | ORAL | Status: DC

## 2015-07-16 MED ORDER — SPIRONOLACTONE 25 MG PO TABS: 25.0000 mg | ORAL_TABLET | Freq: Every day | ORAL | Status: DC

## 2015-07-16 MED ORDER — POTASSIUM CHLORIDE ER 10 MEQ PO TBCR: 10.0000 meq | EXTENDED_RELEASE_TABLET | Freq: Every day | ORAL | Status: DC

## 2015-07-16 MED ORDER — ISOSORBIDE MONONITRATE ER 60 MG PO TB24
60.0000 mg | ORAL_TABLET | Freq: Every day | ORAL | Status: DC
Start: 2015-07-16 — End: 2016-06-24

## 2015-07-16 MED ORDER — ASPIRIN 81 MG PO TBEC: 81.0000 mg | DELAYED_RELEASE_TABLET | Freq: Every day | ORAL | Status: AC

## 2015-07-16 MED ORDER — LOSARTAN POTASSIUM 25 MG PO TABS: 25.0000 mg | ORAL_TABLET | Freq: Every day | ORAL | Status: DC

## 2015-07-16 MED ORDER — PRAVASTATIN SODIUM 80 MG PO TABS: 80.0000 mg | ORAL_TABLET | Freq: Every day | ORAL | Status: DC

## 2015-07-16 MED ORDER — HYDRALAZINE HCL 50 MG PO TABS: 75.0000 mg | ORAL_TABLET | Freq: Three times a day (TID) | ORAL | Status: DC

## 2015-07-16 MED FILL — LOSARTAN POTASSIUM 25 MG TA: 25 | 30 days supply | Qty: 30 | Fill #0

## 2015-07-16 MED FILL — ?FUROSEMIDE 40 MG TABLET: 40 | 30 days supply | Qty: 30 | Fill #0

## 2015-07-16 MED FILL — ISOSORBIDE MN ER 60 MG TAB: 60 | 30 days supply | Qty: 30 | Fill #0

## 2015-07-16 MED FILL — ?CARVEDILOL 12.5 MG TABLET: 12.5 | 30 days supply | Qty: 60 | Fill #0

## 2015-07-16 MED FILL — PRAVASTATIN NA 80 MG TAB: 80 | 30 days supply | Qty: 30 | Fill #0

## 2015-07-16 MED FILL — hydrALAZINE HCL 50 MG TABS: 50 | 30 days supply | Qty: 135 | Fill #0

## 2015-07-16 MED FILL — POTASSIUM CL 10 MEQ TAB SA: 10 | 30 days supply | Qty: 30 | Fill #0

## 2015-07-16 MED FILL — ?SPIRONOLACTONE 25 MG TABLE: 25 | 30 days supply | Qty: 30 | Fill #0

## 2015-07-16 NOTE — Telephone Encounter (Signed)
Patient left VM on CHF triage line requesting all medications be refilled to [facility]. All cardiac meds refilled electronically as requested.  Ave Filter

## 2015-07-17 ENCOUNTER — Encounter: Payer: Self-pay | Admitting: Family Medicine

## 2015-07-17 ENCOUNTER — Ambulatory Visit: Payer: Self-pay | Attending: Family Medicine | Admitting: Family Medicine

## 2015-07-17 VITALS — BP 144/101 | HR 82 | Temp 98.4°F | Resp 12 | Ht 72.0 in | Wt 291.0 lb

## 2015-07-17 DIAGNOSIS — Z79899 Other long term (current) drug therapy: Secondary | ICD-10-CM | POA: Insufficient documentation

## 2015-07-17 DIAGNOSIS — Z7982 Long term (current) use of aspirin: Secondary | ICD-10-CM | POA: Insufficient documentation

## 2015-07-17 DIAGNOSIS — I509 Heart failure, unspecified: Secondary | ICD-10-CM | POA: Insufficient documentation

## 2015-07-17 DIAGNOSIS — I10 Essential (primary) hypertension: Secondary | ICD-10-CM | POA: Insufficient documentation

## 2015-07-17 DIAGNOSIS — Z7984 Long term (current) use of oral hypoglycemic drugs: Secondary | ICD-10-CM | POA: Insufficient documentation

## 2015-07-17 DIAGNOSIS — E1165 Type 2 diabetes mellitus with hyperglycemia: Secondary | ICD-10-CM | POA: Insufficient documentation

## 2015-07-17 DIAGNOSIS — I429 Cardiomyopathy, unspecified: Secondary | ICD-10-CM | POA: Insufficient documentation

## 2015-07-17 DIAGNOSIS — G4733 Obstructive sleep apnea (adult) (pediatric): Secondary | ICD-10-CM | POA: Insufficient documentation

## 2015-07-17 LAB — POCT GLYCOSYLATED HEMOGLOBIN (HGB A1C): HEMOGLOBIN A1C: 11.5

## 2015-07-17 LAB — BASIC METABOLIC PANEL
BUN: 15 mg/dL (ref 7–25)
CHLORIDE: 100 mmol/L (ref 98–110)
CO2: 31 mmol/L (ref 20–31)
Calcium: 9.7 mg/dL (ref 8.6–10.3)
Creat: 1.22 mg/dL (ref 0.60–1.35)
GLUCOSE: 100 mg/dL — AB (ref 65–99)
POTASSIUM: 4.2 mmol/L (ref 3.5–5.3)
Sodium: 139 mmol/L (ref 135–146)

## 2015-07-17 LAB — GLUCOSE, POCT (MANUAL RESULT ENTRY): POC GLUCOSE: 99 mg/dL (ref 70–99)

## 2015-07-17 MED ORDER — METFORMIN HCL 500 MG PO TABS: 1000.0000 mg | ORAL_TABLET | Freq: Two times a day (BID) | ORAL | Status: DC

## 2015-07-17 MED ORDER — GLUCOSE BLOOD VI STRP: ORAL_STRIP | Status: DC

## 2015-07-17 MED FILL — TRUE METRIX TEST STRIP: 33 days supply | Qty: 100 | Fill #0

## 2015-07-17 MED FILL — ?METFORMIN HCL 500MG TABLET: 500 | 30 days supply | Qty: 120 | Fill #0

## 2015-07-17 NOTE — Progress Notes (Signed)
Subjective:  Patient ID: Vernon Reed, male    DOB: 29-May-1976  Age: 40 y.o. MRN: [id number]  CC: Diabetes   HPI Vernon Reed is a 40 year old male with a history of chronic combined systolic and diastolic CHF (EF 06%), hypertension, obstructive sleep apnea, type 2 diabetes mellitus (A1c 11.5) who comes into the clinic for follow-up visit. I had seen him 6 months ago at which time his A1c was 6.1 he was maintained on dietary control but never followed up after. He presented to his cardiology visit as well as the ED at [facility] with malaise and fatigue and was found to have blood sugars in the 400s for which he was commenced on metformin. He reports compliance with his current dose of metformin and reports that his fasting blood sugars have been in the 140s and random blood sugars have remained in the upper 90s but he is not here with his blood sugar log or his glucometer.  His last visit to the heart failure clinic was on 06/14/15 at which time he was commenced on losartan. He has no acute complaints at this time.  Outpatient Prescriptions Prior to Visit  Medication Sig Dispense Refill  . aspirin 81 MG EC tablet Take 1 tablet (81 mg total) by mouth daily. 30 tablet 6  . Blood Glucose Monitoring Suppl (TRUE METRIX METER) W/DEVICE KIT 1 each by Does not apply route once as needed. 1 kit 0  . carvedilol (COREG) 12.5 MG tablet Take 1 tablet (12.5 mg total) by mouth 2 (two) times daily with a meal. 60 tablet 6  . Cholecalciferol (VITAMIN D PO) Take 1 tablet by mouth daily.    . furosemide (LASIX) 40 MG tablet Take 1 tablet (40 mg total) by mouth daily. 30 tablet 6  . hydrALAZINE (APRESOLINE) 50 MG tablet Take 1.5 tablets (75 mg total) by mouth 3 (three) times daily. 135 tablet 6  . isosorbide mononitrate (IMDUR) 60 MG 24 hr tablet Take 1 tablet (60 mg total) by mouth daily. 30 tablet 6  . losartan (COZAAR) 25 MG tablet Take 1 tablet (25 mg total) by mouth daily. 30 tablet 1  . potassium  chloride (K-DUR) 10 MEQ tablet Take 1 tablet (10 mEq total) by mouth daily. 30 tablet 6  . pravastatin (PRAVACHOL) 80 MG tablet Take 1 tablet (80 mg total) by mouth daily. 30 tablet 6  . spironolactone (ALDACTONE) 25 MG tablet Take 1 tablet (25 mg total) by mouth daily. 30 tablet 6  . TRUEPLUS LANCETS 30G MISC 1 each by Does not apply route every morning. 50 each 11  . glucose blood (TRUE METRIX BLOOD GLUCOSE TEST) test strip Use as instructed once daily before breakfast 50 each 12  . metFORMIN (GLUCOPHAGE) 500 MG tablet Take 1 tablet (500 mg total) by mouth 2 (two) times daily with a meal. 60 tablet 1  . albuterol (PROVENTIL HFA;VENTOLIN HFA) 108 (90 BASE) MCG/ACT inhaler Inhale 1-2 puffs into the lungs every 6 (six) hours as needed for wheezing or shortness of breath. (Patient not taking: Reported on 07/17/2015) 1 Inhaler 0   No facility-administered medications prior to visit.    ROS Review of Systems  Constitutional: Negative for activity change and appetite change.  HENT: Negative for sinus pressure and sore throat.   Eyes: Negative for visual disturbance.  Respiratory: Negative for cough, chest tightness and shortness of breath.   Cardiovascular: Negative for chest pain and leg swelling.  Gastrointestinal: Negative for abdominal pain, diarrhea, constipation and abdominal  distention.  Endocrine: Negative.   Genitourinary: Negative for dysuria.  Musculoskeletal: Negative for myalgias and joint swelling.  Skin: Negative for rash.  Allergic/Immunologic: Negative.   Neurological: Negative for weakness, light-headedness and numbness.  Psychiatric/Behavioral: Negative for suicidal ideas and dysphoric mood.    Objective:  BP 144/101 mmHg  Pulse 82  Temp(Src) 98.4 F (36.9 C)  Resp 12  Ht 6' (1.829 m)  Wt 291 lb (131.997 kg)  BMI 39.46 kg/m2  SpO2 96%  BP/Weight 07/17/2015 06/15/2015 69/10/8544  Systolic BP 270 350 093  Diastolic BP 818 74 299  Wt. (Lbs) 291 290 291.4  BMI 39.46  39.32 39.51    Lab Results  Component Value Date   WBC 9.2 06/15/2015   HGB 13.3 06/15/2015   HCT 39.5 06/15/2015   PLT 252 06/15/2015   GLUCOSE 549* 06/15/2015   CHOL 165 12/07/2014   TRIG 134 12/07/2014   HDL 22* 12/07/2014   LDLCALC 116* 12/07/2014   ALT 39 07/23/2013   AST 25 07/23/2013   NA 129* 06/15/2015   K 5.1 06/15/2015   CL 96* 06/15/2015   CREATININE 1.46* 06/15/2015   BUN 25* 06/15/2015   CO2 26 06/15/2015   TSH 0.918 12/27/2013   INR 0.99 07/26/2013   HGBA1C 11.50 07/17/2015    Physical Exam  Constitutional: He is oriented to person, place, and time. He appears well-developed and well-nourished.  HENT:  Head: Normocephalic and atraumatic.  Right Ear: External ear normal.  Left Ear: External ear normal.  Eyes: Conjunctivae and EOM are normal. Pupils are equal, round, and reactive to light.  Neck: Normal range of motion. No tracheal deviation present.  Cardiovascular: Normal rate, regular rhythm and normal heart sounds.   No murmur heard. Pulmonary/Chest: Effort normal and breath sounds normal. No respiratory distress. He has no wheezes. He exhibits no tenderness.  Abdominal: Soft. Bowel sounds are normal. He exhibits no mass. There is no tenderness.  Musculoskeletal: Normal range of motion. He exhibits no edema or tenderness.  Neurological: He is alert and oriented to person, place, and time.  Skin: Skin is warm and dry.  Psychiatric: He has a normal mood and affect.     Assessment & Plan:   1. Type 2 diabetes mellitus with hyperglycemia, without long-term current use of insulin (HCC) A1c is 11.5 which is up from 6.1 seven months ago. Increase metformin dose from 500 twice daily to 1000 twice daily. Advised to keep a blood sugar log which will be reviewed at his next visit at which time he will also be meeting with the clinical pharmacist for diabetic education. Foot exam performed today, he has been advised on annual eye exams and urine microalbumin  will be sent off today. He is deferring Pneumovax to his next visit. - Glucose (CBG) - HgB A1c - metFORMIN (GLUCOPHAGE) 500 MG tablet; Take 2 tablets (1,000 mg total) by mouth 2 (two) times daily with a meal.  Dispense: 120 tablet; Refill: 2 - glucose blood (TRUE METRIX BLOOD GLUCOSE TEST) test strip; Use as instructed 3 times daily before meals  Dispense: 100 each; Refill: 12 - Microalbumin/Creatinine Ratio, Urine - Basic Metabolic Panel  2. Congestive heart failure, unspecified congestive heart failure chronicity, unspecified congestive heart failure type ([place]) Ejection fraction of 25%, diffuse hypokinesis from 2-D echo of 12/2014 Euvolemic at this time Continue Imdur, hydralazine. Advised on daily weight checks, low-sodium diet, limiting fluids to less than 2 L per day. Keep appointment with the heart failure clinic.  3.  Cardiomyopathy [facility]) Nonischemic cardiomyopathy Keep appointment with the heart failure clinic.  4. OSA (obstructive sleep apnea) Stable.   Meds ordered this encounter  Medications  . metFORMIN (GLUCOPHAGE) 500 MG tablet    Sig: Take 2 tablets (1,000 mg total) by mouth 2 (two) times daily with a meal.    Dispense:  120 tablet    Refill:  2  . glucose blood (TRUE METRIX BLOOD GLUCOSE TEST) test strip    Sig: Use as instructed 3 times daily before meals    Dispense:  100 each    Refill:  12    Follow-up: Return in about 2 weeks (around 07/31/2015) for Follow-up of diabetes mellitus.   Arnoldo Morale MD

## 2015-07-17 NOTE — Patient Instructions (Signed)
Diabetes Mellitus and Food It is important for you to manage your blood sugar (glucose) level. Your blood glucose level can be greatly affected by what you eat. Eating healthier foods in the appropriate amounts throughout the day at about the same time each day will help you control your blood glucose level. It can also help slow or prevent worsening of your diabetes mellitus. Healthy eating may even help you improve the level of your blood pressure and reach or maintain a healthy weight.  General recommendations for healthful eating and cooking habits include:  Eating meals and snacks regularly. Avoid going long periods of time without eating to lose weight.  Eating a diet that consists mainly of plant-based foods, such as fruits, vegetables, nuts, legumes, and whole grains.  Using low-heat cooking methods, such as baking, instead of high-heat cooking methods, such as deep frying. Work with your dietitian to make sure you understand how to use the Nutrition Facts information on food labels. HOW CAN FOOD AFFECT ME? Carbohydrates Carbohydrates affect your blood glucose level more than any other type of food. Your dietitian will help you determine how many carbohydrates to eat at each meal and teach you how to count carbohydrates. Counting carbohydrates is important to keep your blood glucose at a healthy level, especially if you are using insulin or taking certain medicines for diabetes mellitus. Alcohol Alcohol can cause sudden decreases in blood glucose (hypoglycemia), especially if you use insulin or take certain medicines for diabetes mellitus. Hypoglycemia can be a life-threatening condition. Symptoms of hypoglycemia (sleepiness, dizziness, and disorientation) are similar to symptoms of having too much alcohol.  If your health care provider has given you approval to drink alcohol, do so in moderation and use the following guidelines:  Women should not have more than one drink per day, and men  should not have more than two drinks per day. One drink is equal to:  12 oz of beer.  5 oz of wine.  1 oz of hard liquor.  Do not drink on an empty stomach.  Keep yourself hydrated. Have water, diet soda, or unsweetened iced tea.  Regular soda, juice, and other mixers might contain a lot of carbohydrates and should be counted. WHAT FOODS ARE NOT RECOMMENDED? As you make food choices, it is important to remember that all foods are not the same. Some foods have fewer nutrients per serving than other foods, even though they might have the same number of calories or carbohydrates. It is difficult to get your body what it needs when you eat foods with fewer nutrients. Examples of foods that you should avoid that are high in calories and carbohydrates but low in nutrients include:  Trans fats (most processed foods list trans fats on the Nutrition Facts label).  Regular soda.  Juice.  Candy.  Sweets, such as cake, pie, doughnuts, and cookies.  Fried foods. WHAT FOODS CAN I EAT? Eat nutrient-rich foods, which will nourish your body and keep you healthy. The food you should eat also will depend on several factors, including:  The calories you need.  The medicines you take.  Your weight.  Your blood glucose level.  Your blood pressure level.  Your cholesterol level. You should eat a variety of foods, including:  Protein.  Lean cuts of meat.  Proteins low in saturated fats, such as fish, egg whites, and beans. Avoid processed meats.  Fruits and vegetables.  Fruits and vegetables that may help control blood glucose levels, such as apples, mangoes, and   yams.  Dairy products.  Choose fat-free or low-fat dairy products, such as milk, yogurt, and cheese.  Grains, bread, pasta, and rice.  Choose whole grain products, such as multigrain bread, whole oats, and brown rice. These foods may help control blood pressure.  Fats.  Foods containing healthful fats, such as nuts,  avocado, olive oil, canola oil, and fish. DOES EVERYONE WITH DIABETES MELLITUS HAVE THE SAME MEAL PLAN? Because every person with diabetes mellitus is different, there is not one meal plan that works for everyone. It is very important that you meet with a dietitian who will help you create a meal plan that is just right for you.   This information is not intended to replace advice given to you by your health care provider. Make sure you discuss any questions you have with your health care provider.   Document Released: 03/20/2005 Document Revised: 07/14/2014 Document Reviewed: 05/20/2013 Elsevier Interactive Patient Education 2016 Elsevier Inc.  

## 2015-07-17 NOTE — Progress Notes (Signed)
Patient here for follow up on his diabetes He reports no pain or concerns He does need refills on medications and has been out since Sunday

## 2015-07-18 LAB — MICROALBUMIN / CREATININE URINE RATIO: Creatinine, Urine: 53 mg/dL (ref 20–370)

## 2015-07-20 ENCOUNTER — Telehealth: Payer: Self-pay

## 2015-07-20 NOTE — Telephone Encounter (Signed)
-----   Message from Jaclyn Shaggy, MD sent at 07/18/2015  8:20 AM EST ----- Please inform the patient that labs are normal. Thank you.

## 2015-07-20 NOTE — Telephone Encounter (Signed)
CMA called patient, patient verified name and DOB. Patient was given lab results, verbalized he understood with no further questions. 

## 2015-08-01 ENCOUNTER — Ambulatory Visit: Payer: Self-pay | Attending: Family Medicine | Admitting: Family Medicine

## 2015-08-01 ENCOUNTER — Encounter: Payer: Self-pay | Admitting: Family Medicine

## 2015-08-01 VITALS — BP 138/88 | HR 98 | Temp 98.8°F | Resp 15 | Ht 72.0 in | Wt 296.8 lb

## 2015-08-01 DIAGNOSIS — I429 Cardiomyopathy, unspecified: Secondary | ICD-10-CM | POA: Insufficient documentation

## 2015-08-01 DIAGNOSIS — I5022 Chronic systolic (congestive) heart failure: Secondary | ICD-10-CM

## 2015-08-01 DIAGNOSIS — I10 Essential (primary) hypertension: Secondary | ICD-10-CM | POA: Insufficient documentation

## 2015-08-01 DIAGNOSIS — G4733 Obstructive sleep apnea (adult) (pediatric): Secondary | ICD-10-CM | POA: Insufficient documentation

## 2015-08-01 DIAGNOSIS — I158 Other secondary hypertension: Secondary | ICD-10-CM

## 2015-08-01 DIAGNOSIS — E1165 Type 2 diabetes mellitus with hyperglycemia: Secondary | ICD-10-CM | POA: Insufficient documentation

## 2015-08-01 DIAGNOSIS — Z23 Encounter for immunization: Secondary | ICD-10-CM

## 2015-08-01 DIAGNOSIS — I159 Secondary hypertension, unspecified: Secondary | ICD-10-CM | POA: Insufficient documentation

## 2015-08-01 DIAGNOSIS — I5042 Chronic combined systolic (congestive) and diastolic (congestive) heart failure: Secondary | ICD-10-CM | POA: Insufficient documentation

## 2015-08-01 DIAGNOSIS — Z79899 Other long term (current) drug therapy: Secondary | ICD-10-CM | POA: Insufficient documentation

## 2015-08-01 DIAGNOSIS — Z7982 Long term (current) use of aspirin: Secondary | ICD-10-CM | POA: Insufficient documentation

## 2015-08-01 DIAGNOSIS — Z7984 Long term (current) use of oral hypoglycemic drugs: Secondary | ICD-10-CM | POA: Insufficient documentation

## 2015-08-01 LAB — GLUCOSE, POCT (MANUAL RESULT ENTRY): POC Glucose: 73 mg/dl (ref 70–99)

## 2015-08-01 NOTE — Patient Instructions (Signed)
Diabetes Mellitus and Food It is important for you to manage your blood sugar (glucose) level. Your blood glucose level can be greatly affected by what you eat. Eating healthier foods in the appropriate amounts throughout the day at about the same time each day will help you control your blood glucose level. It can also help slow or prevent worsening of your diabetes mellitus. Healthy eating may even help you improve the level of your blood pressure and reach or maintain a healthy weight.  General recommendations for healthful eating and cooking habits include:  Eating meals and snacks regularly. Avoid going long periods of time without eating to lose weight.  Eating a diet that consists mainly of plant-based foods, such as fruits, vegetables, nuts, legumes, and whole grains.  Using low-heat cooking methods, such as baking, instead of high-heat cooking methods, such as deep frying. Work with your dietitian to make sure you understand how to use the Nutrition Facts information on food labels. HOW CAN FOOD AFFECT ME? Carbohydrates Carbohydrates affect your blood glucose level more than any other type of food. Your dietitian will help you determine how many carbohydrates to eat at each meal and teach you how to count carbohydrates. Counting carbohydrates is important to keep your blood glucose at a healthy level, especially if you are using insulin or taking certain medicines for diabetes mellitus. Alcohol Alcohol can cause sudden decreases in blood glucose (hypoglycemia), especially if you use insulin or take certain medicines for diabetes mellitus. Hypoglycemia can be a life-threatening condition. Symptoms of hypoglycemia (sleepiness, dizziness, and disorientation) are similar to symptoms of having too much alcohol.  If your health care provider has given you approval to drink alcohol, do so in moderation and use the following guidelines:  Women should not have more than one drink per day, and men  should not have more than two drinks per day. One drink is equal to:  12 oz of beer.  5 oz of wine.  1 oz of hard liquor.  Do not drink on an empty stomach.  Keep yourself hydrated. Have water, diet soda, or unsweetened iced tea.  Regular soda, juice, and other mixers might contain a lot of carbohydrates and should be counted. WHAT FOODS ARE NOT RECOMMENDED? As you make food choices, it is important to remember that all foods are not the same. Some foods have fewer nutrients per serving than other foods, even though they might have the same number of calories or carbohydrates. It is difficult to get your body what it needs when you eat foods with fewer nutrients. Examples of foods that you should avoid that are high in calories and carbohydrates but low in nutrients include:  Trans fats (most processed foods list trans fats on the Nutrition Facts label).  Regular soda.  Juice.  Candy.  Sweets, such as cake, pie, doughnuts, and cookies.  Fried foods. WHAT FOODS CAN I EAT? Eat nutrient-rich foods, which will nourish your body and keep you healthy. The food you should eat also will depend on several factors, including:  The calories you need.  The medicines you take.  Your weight.  Your blood glucose level.  Your blood pressure level.  Your cholesterol level. You should eat a variety of foods, including:  Protein.  Lean cuts of meat.  Proteins low in saturated fats, such as fish, egg whites, and beans. Avoid processed meats.  Fruits and vegetables.  Fruits and vegetables that may help control blood glucose levels, such as apples, mangoes, and   yams.  Dairy products.  Choose fat-free or low-fat dairy products, such as milk, yogurt, and cheese.  Grains, bread, pasta, and rice.  Choose whole grain products, such as multigrain bread, whole oats, and brown rice. These foods may help control blood pressure.  Fats.  Foods containing healthful fats, such as nuts,  avocado, olive oil, canola oil, and fish. DOES EVERYONE WITH DIABETES MELLITUS HAVE THE SAME MEAL PLAN? Because every person with diabetes mellitus is different, there is not one meal plan that works for everyone. It is very important that you meet with a dietitian who will help you create a meal plan that is just right for you.   This information is not intended to replace advice given to you by your health care provider. Make sure you discuss any questions you have with your health care provider.   Document Released: 03/20/2005 Document Revised: 07/14/2014 Document Reviewed: 05/20/2013 Elsevier Interactive Patient Education 2016 Elsevier Inc.  

## 2015-08-01 NOTE — Progress Notes (Signed)
Patient has no complaints and states he has taken his medications and has been watching his diet He received both the Pneumovax and Influenza vaccines today

## 2015-08-01 NOTE — Progress Notes (Signed)
Subjective:  Patient ID: Vernon Reed, male    DOB: 27-Jan-1976  Age: 40 y.o. MRN: [id number]  CC: Follow-up   HPI Jamoni Hewes is a 40 year old male with a history of chronic combined systolic and diastolic CHF (EF 94%), hypertension, obstructive sleep apnea, type 2 diabetes mellitus (A1c 11.5) who comes into the clinic for a follow-up visit. He has been compliant with metformin but states that sometimes he has to go to the bathroom more than usual to move his bowels; fasting sugars have been in the 95-113 range and he denies hypoglycemic episodes. Does not get any much exercise He recently had an annual eye exam.  His blood pressure is controlled on his current antihypertensives. He is followed by the heart failure clinic with his last visit in 06/2015 and he has an upcoming appointment next month. Denies chest pains, shortness of breath and has a good exercise tolerance.   Outpatient Prescriptions Prior to Visit  Medication Sig Dispense Refill  . aspirin 81 MG EC tablet Take 1 tablet (81 mg total) by mouth daily. 30 tablet 6  . Blood Glucose Monitoring Suppl (TRUE METRIX METER) W/DEVICE KIT 1 each by Does not apply route once as needed. 1 kit 0  . carvedilol (COREG) 12.5 MG tablet Take 1 tablet (12.5 mg total) by mouth 2 (two) times daily with a meal. 60 tablet 6  . Cholecalciferol (VITAMIN D PO) Take 1 tablet by mouth daily.    . furosemide (LASIX) 40 MG tablet Take 1 tablet (40 mg total) by mouth daily. 30 tablet 6  . glucose blood (TRUE METRIX BLOOD GLUCOSE TEST) test strip Use as instructed 3 times daily before meals 100 each 12  . hydrALAZINE (APRESOLINE) 50 MG tablet Take 1.5 tablets (75 mg total) by mouth 3 (three) times daily. 135 tablet 6  . isosorbide mononitrate (IMDUR) 60 MG 24 hr tablet Take 1 tablet (60 mg total) by mouth daily. 30 tablet 6  . losartan (COZAAR) 25 MG tablet Take 1 tablet (25 mg total) by mouth daily. 30 tablet 1  . metFORMIN (GLUCOPHAGE) 500 MG  tablet Take 2 tablets (1,000 mg total) by mouth 2 (two) times daily with a meal. 120 tablet 2  . potassium chloride (K-DUR) 10 MEQ tablet Take 1 tablet (10 mEq total) by mouth daily. 30 tablet 6  . pravastatin (PRAVACHOL) 80 MG tablet Take 1 tablet (80 mg total) by mouth daily. 30 tablet 6  . spironolactone (ALDACTONE) 25 MG tablet Take 1 tablet (25 mg total) by mouth daily. 30 tablet 6  . TRUEPLUS LANCETS 30G MISC 1 each by Does not apply route every morning. 50 each 11   No facility-administered medications prior to visit.    ROS Review of Systems  Constitutional: Negative for activity change and appetite change.  HENT: Negative for sinus pressure and sore throat.   Eyes: Negative for visual disturbance.  Respiratory: Negative for cough, chest tightness and shortness of breath.   Cardiovascular: Negative for chest pain and leg swelling.  Gastrointestinal: Negative for abdominal pain, diarrhea, constipation and abdominal distention.  Endocrine: Negative.   Genitourinary: Negative for dysuria.  Musculoskeletal: Negative for myalgias and joint swelling.  Skin: Negative for rash.  Allergic/Immunologic: Negative.   Neurological: Negative for weakness, light-headedness and numbness.  Psychiatric/Behavioral: Negative for suicidal ideas and dysphoric mood.    Objective:  BP 138/88 mmHg  Pulse 98  Temp(Src) 98.8 F (37.1 C)  Resp 15  Ht 6' (1.829 m)  Wt 296 lb  12.8 oz (134.628 kg)  BMI 40.24 kg/m2  SpO2 96%   BP/Weight 08/01/2015 07/17/2015 42/11/9561  Systolic BP 875 643 329  Diastolic BP 88 518 74  Wt. (Lbs) 296.8 291 290  BMI 40.24 39.46 39.32   Wt Readings from Last 3 Encounters:  08/01/15 296 lb 12.8 oz (134.628 kg)  07/17/15 291 lb (131.997 kg)  06/15/15 290 lb (131.543 kg)      Physical Exam  Constitutional: He is oriented to person, place, and time. He appears well-developed and well-nourished.  Cardiovascular: Normal rate, normal heart sounds and intact distal  pulses.   No murmur heard. Pulmonary/Chest: Effort normal and breath sounds normal. He has no wheezes. He has no rales. He exhibits no tenderness.  Abdominal: Soft. Bowel sounds are normal. He exhibits no distension and no mass. There is no tenderness.  Musculoskeletal: Normal range of motion.  Neurological: He is alert and oriented to person, place, and time.     Assessment & Plan:   1. Type 2 diabetes mellitus with hyperglycemia, without long-term current use of insulin (HCC) A1c of 11.5 from 07/2015 Improving based on reported blood sugars Continue metformin Pneumovax today. - Glucose (CBG)  2. Need for prophylactic vaccination and inoculation against influenza - Flu Vaccine QUAD 36+ mos PF IM (Fluarix & Fluzone Quad PF)  3. Other secondary hypertension Controlled. Continue antihypertensives  4. Cardiomyopathy ([place]) Nonischemic cardiomyopathy. Follow closely by cardiology  5. OSA (obstructive sleep apnea) Stable  6. Chronic systolic heart failure (HCC) EF 25% from 2-D echo 12/2014. Euvolemic at this time. He has gained 6 pounds in the last 6 weeks. Advised on daily weight checks, limiting fluids to less than 2 L per day, low sodium, cardiac diet.   No orders of the defined types were placed in this encounter.    Follow-up: Return in about 3 months (around 10/30/2015) for follow up of Diabetes mellitus.   Arnoldo Morale MD

## 2015-08-16 ENCOUNTER — Telehealth: Payer: Self-pay | Admitting: Family Medicine

## 2015-08-16 NOTE — Telephone Encounter (Signed)
Patient called in to report his blood sugar was 65.  He wanted advice.  RN advised him to drink a glass of juice or eat some crackers or the like and sit for 20 minutes and recheck sugar and that it should come up.  Patient states he hasn't eaten anything since noon today and RN explained that that was most likely the reason.  Patient encourage to schedule appointment at clinic for diabetes education and patient in agreement.  Transferred to scheduler to make appointment.  Patient appreciative.

## 2015-08-20 ENCOUNTER — Encounter: Payer: Self-pay | Admitting: Pharmacist

## 2015-08-20 ENCOUNTER — Ambulatory Visit: Payer: Self-pay | Attending: Family Medicine | Admitting: Pharmacist

## 2015-08-20 DIAGNOSIS — E1165 Type 2 diabetes mellitus with hyperglycemia: Secondary | ICD-10-CM

## 2015-08-20 NOTE — Patient Instructions (Signed)
Thank you for coming to see me.  Follow up with Dr. Venetia Night as directed  Hypoglycemia Low blood sugar (hypoglycemia) means that the level of sugar in your blood is lower than it should be. Signs of low blood sugar include:  Getting sweaty.  Feeling hungry.  Feeling dizzy or weak.  Feeling sleepier than normal.  Feeling nervous.  Headaches.  Having a fast heartbeat. Low blood sugar can happen fast and can be an emergency. Your doctor can do tests to check your blood sugar level. You can have low blood sugar and not have diabetes. HOME CARE  Check your blood sugar as told by your doctor. If it is less than 70 mg/dl or as told by your doctor, take 1 of the following:  3 to 4 glucose tablets.   cup clear juice.   cup soda pop, not diet.  1 cup milk.  5 to 6 hard candies.  Recheck blood sugar after 15 minutes. Repeat until it is at the right level.  Eat a snack if it is more than 1 hour until the next meal.  Only take medicine as told by your doctor.  Do not skip meals. Eat on time.  Do not drink alcohol except with meals.  Check your blood glucose before driving.  Check your blood glucose before and after exercise.  Always carry treatment with you, such as glucose pills.  Always wear a medical alert bracelet if you have diabetes. GET HELP RIGHT AWAY IF:   Your blood glucose goes below 70 mg/dl or as told by your doctor, and you:  Are confused.  Are not able to swallow.  Pass out (faint).  You cannot treat yourself. You may need someone to help you.  You have low blood sugar problems often.  You have problems from your medicines.  You are not feeling better after 3 to 4 days.  You have vision changes. MAKE SURE YOU:   Understand these instructions.  Will watch this condition.  Will get help right away if you are not doing well or get worse.   This information is not intended to replace advice given to you by your health care provider. Make sure  you discuss any questions you have with your health care provider.   Document Released: 09/17/2009 Document Revised: 07/14/2014 Document Reviewed: 02/27/2015 Elsevier Interactive Patient Education [employer]

## 2015-08-20 NOTE — Progress Notes (Signed)
S:    Patient arrives in good spirits.  Presents for diabetes evaluation, education, and management at the request of Dr. Venetia Night. Patient was referred on 08/16/15.  Patient was last seen by Primary Care Provider on 08/01/15.    Patient reports adherence with medications.  Current diabetes medications include: metformin 1000 mg BID.  Patient reports hypoglycemic events. He had one reading of 65 and called our office for advice. He has had two other episodes - one of 68 and one of 69. He reports that he felt jittery when he had the episodes.  Patient reported dietary habits: patient reports that he has completely cut out carbs from his diet (though there will be minimal carbs in some of the things that he eats).  Patient reported exercise habits: trying to exercise more.   Patient denies nocturia.  Patient denies neuropathy. Patient denies visual changes. Patient reports self foot exams.    O:  Lab Results  Component Value Date   HGBA1C 11.50 07/17/2015   There were no vitals filed for this visit.  Patient brought meter with him to the visit  Home fasting CBG: 65 - 104  2 hour post-prandial/random CBG: 69 - 156   A/P: Diabetes currently uncontrolled based on A1c of 11.5 but under much improved control based on home CBGs. Patient reports hypoglycemic events and is able to verbalize appropriate hypoglycemia management plan. Patient reports adherence with medication. Control is suboptimal due to hypoglycemic events.  Continue metformin 1000 mg BID. I think the hypoglycemia is from the drastic change in diet and cutting out a lot of carbs rather than the metformin. Patient to make sure that he eats enough at each meal to avoid hypoglycemia. Reviewed hypoglycemia with him and the appropriate treatment. Reviewed blood glucose goals and appropriate carb intake (to prevent hypoglycemia). Patient verbalized understanding and will contact the clinic if he continues to have hypoglycemia.   Next A1C  anticipated April 2017.    Written patient instructions provided, including multiple papers on managing hypoglycemia.  Total time in face to face counseling 20 minutes.   Follow up with Dr. Venetia Night as directed.

## 2015-09-04 MED FILL — POTASSIUM CL 10 MEQ TAB SA: 10 | 30 days supply | Qty: 30 | Fill #1

## 2015-09-04 MED FILL — SPIRONOLACTONE 25 MG TABLET: 25 | 30 days supply | Qty: 30 | Fill #1

## 2015-09-04 MED FILL — PRAVASTATIN NA 80 MG TAB: 80 | 30 days supply | Qty: 30 | Fill #1

## 2015-09-04 MED FILL — ISOSORBIDE MN ER 60 MG TAB: 60 | 30 days supply | Qty: 30 | Fill #1

## 2015-09-04 MED FILL — CARVEDILOL 12.5 MG TABLET: 12.5 | 30 days supply | Qty: 60 | Fill #1

## 2015-09-04 MED FILL — LOSARTAN POTASSIUM 25 MG TA: 25 | 30 days supply | Qty: 30 | Fill #1

## 2015-09-04 MED FILL — FUROSEMIDE 40 MG TABLET: 40 | 30 days supply | Qty: 30 | Fill #1

## 2015-09-04 MED FILL — metFORMIN HCL 500 MG TABS: 500 | 30 days supply | Qty: 120 | Fill #1

## 2015-10-17 MED FILL — CARVEDILOL 12.5 MG TABLET: 12.5 | 30 days supply | Qty: 60 | Fill #2

## 2015-10-17 MED FILL — ISOSORBIDE MN ER 60 MG TAB: 60 | 30 days supply | Qty: 30 | Fill #2

## 2015-10-17 MED FILL — LOSARTAN POTASSIUM 25 MG TA: 25 | 30 days supply | Qty: 30 | Fill #1

## 2015-10-17 MED FILL — PRAVASTATIN NA 80 MG TAB: 80 | 30 days supply | Qty: 30 | Fill #2

## 2015-10-17 MED FILL — POTASSIUM CL 10 MEQ TAB SA: 10 | 30 days supply | Qty: 30 | Fill #2

## 2015-10-17 MED FILL — ?SPIRONOLACTONE 25 MG TABLE: 25 | 30 days supply | Qty: 30 | Fill #2

## 2015-10-17 MED FILL — ?FUROSEMIDE 40 MG TABLET: 40 | 30 days supply | Qty: 30 | Fill #2

## 2015-12-11 ENCOUNTER — Other Ambulatory Visit (HOSPITAL_COMMUNITY): Payer: Self-pay | Admitting: Adult Health

## 2016-01-01 MED FILL — ?METFORMIN HCL 500MG TABLET: 500 | 30 days supply | Qty: 120 | Fill #2

## 2016-01-01 MED FILL — PRAVASTATIN NA 80 MG TAB: 80 | 30 days supply | Qty: 30 | Fill #3

## 2016-01-01 MED FILL — FUROSEMIDE 40 MG TABLET: 40 | 30 days supply | Qty: 30 | Fill #3

## 2016-01-01 MED FILL — POTASSIUM CL 10 MEQ TAB SA: 10 | 30 days supply | Qty: 30 | Fill #3

## 2016-01-01 MED FILL — ?CARVEDILOL 12.5 MG TABLET: 12.5 | 30 days supply | Qty: 60 | Fill #3

## 2016-01-01 MED FILL — ?SPIRONOLACTONE 25 MG TABLE: 25 | 30 days supply | Qty: 30 | Fill #3

## 2016-01-01 MED FILL — LOSARTAN POTASSIUM 25 MG TA: 25 | 30 days supply | Qty: 30 | Fill #0

## 2016-01-01 MED FILL — ISOSORBIDE MN ER 60 MG TAB: 60 | 30 days supply | Qty: 30 | Fill #3

## 2016-01-02 ENCOUNTER — Other Ambulatory Visit: Payer: Self-pay | Admitting: Family Medicine

## 2016-02-21 MED FILL — ?CARVEDILOL 12.5 MG TABLET: 12.5 | 30 days supply | Qty: 60 | Fill #4

## 2016-02-21 MED FILL — POTASSIUM CL 10 MEQ TAB SA: 10 | 30 days supply | Qty: 30 | Fill #4

## 2016-02-21 MED FILL — PRAVASTATIN NA 80 MG TAB: 80 | 30 days supply | Qty: 30 | Fill #4

## 2016-02-21 MED FILL — LOSARTAN POTASSIUM 25 MG TA: 25 | 30 days supply | Qty: 30 | Fill #1

## 2016-02-21 MED FILL — ?HYDRALAZINE 50 MG TABLET: 50 MG | 30 days supply | Qty: 135 | Fill #1

## 2016-02-21 MED FILL — SPIRONOLACTONE 25 MG TABLET: 25 | 30 days supply | Qty: 30 | Fill #4

## 2016-02-21 MED FILL — ?FUROSEMIDE 40 MG TABLET: 40 | 30 days supply | Qty: 30 | Fill #4

## 2016-02-29 MED FILL — TRUE METRIX TEST STRIP: 30 days supply | Qty: 100 | Fill #0

## 2016-03-01 ENCOUNTER — Emergency Department (HOSPITAL_BASED_OUTPATIENT_CLINIC_OR_DEPARTMENT_OTHER)
Admission: EM | Admit: 2016-03-01 | Discharge: 2016-03-01 | Disposition: A | Discharge status: Home / Self Care | Payer: Self-pay | Attending: Emergency Medicine | Admitting: Emergency Medicine

## 2016-03-01 ENCOUNTER — Encounter (HOSPITAL_BASED_OUTPATIENT_CLINIC_OR_DEPARTMENT_OTHER): Payer: Self-pay | Admitting: Emergency Medicine

## 2016-03-01 DIAGNOSIS — I509 Heart failure, unspecified: Secondary | ICD-10-CM | POA: Insufficient documentation

## 2016-03-01 DIAGNOSIS — Z7984 Long term (current) use of oral hypoglycemic drugs: Secondary | ICD-10-CM | POA: Insufficient documentation

## 2016-03-01 DIAGNOSIS — E1165 Type 2 diabetes mellitus with hyperglycemia: Secondary | ICD-10-CM | POA: Insufficient documentation

## 2016-03-01 DIAGNOSIS — I110 Hypertensive heart disease with heart failure: Secondary | ICD-10-CM | POA: Insufficient documentation

## 2016-03-01 DIAGNOSIS — R5383 Other fatigue: Secondary | ICD-10-CM | POA: Insufficient documentation

## 2016-03-01 DIAGNOSIS — Z7982 Long term (current) use of aspirin: Secondary | ICD-10-CM | POA: Insufficient documentation

## 2016-03-01 DIAGNOSIS — R739 Hyperglycemia, unspecified: Secondary | ICD-10-CM

## 2016-03-01 DIAGNOSIS — Z79899 Other long term (current) drug therapy: Secondary | ICD-10-CM | POA: Insufficient documentation

## 2016-03-01 LAB — BASIC METABOLIC PANEL
ANION GAP: 9 (ref 5–15)
BUN: 30 mg/dL — ABNORMAL HIGH (ref 6–20)
CHLORIDE: 97 mmol/L — AB (ref 101–111)
CO2: 25 mmol/L (ref 22–32)
Calcium: 8.9 mg/dL (ref 8.9–10.3)
Creatinine, Ser: 1.61 mg/dL — ABNORMAL HIGH (ref 0.61–1.24)
GFR calc non Af Amer: 52 mL/min — ABNORMAL LOW (ref >60)
GFR, EST AFRICAN AMERICAN: 60 mL/min — AB (ref >60)
Glucose, Bld: 390 mg/dL — ABNORMAL HIGH (ref 65–99)
Potassium: 4.2 mmol/L (ref 3.5–5.1)
SODIUM: 131 mmol/L — AB (ref 135–145)

## 2016-03-01 LAB — CBC
HCT: 40.3 % (ref 39.0–52.0)
HEMOGLOBIN: 13.9 g/dL (ref 13.0–17.0)
MCH: 30.5 pg (ref 26.0–34.0)
MCHC: 34.5 g/dL (ref 30.0–36.0)
MCV: 88.4 fL (ref 78.0–100.0)
Platelets: 259 10*3/uL (ref 150–400)
RBC: 4.56 MIL/uL (ref 4.22–5.81)
RDW: 12.5 % (ref 11.5–15.5)
WBC: 9.8 10*3/uL (ref 4.0–10.5)

## 2016-03-01 LAB — URINALYSIS, ROUTINE W REFLEX MICROSCOPIC
Bilirubin Urine: NEGATIVE
Glucose, UA: >1000 mg/dL — AB
HGB URINE DIPSTICK: NEGATIVE
Ketones, ur: NEGATIVE mg/dL
LEUKOCYTES UA: NEGATIVE
Nitrite: NEGATIVE
PROTEIN: NEGATIVE mg/dL
SPECIFIC GRAVITY, URINE: 1.015 (ref 1.005–1.030)
pH: 5 (ref 5.0–8.0)

## 2016-03-01 LAB — URINE MICROSCOPIC-ADD ON: RBC / HPF: NONE SEEN RBC/hpf (ref 0–5)

## 2016-03-01 LAB — CBG MONITORING, ED
GLUCOSE-CAPILLARY: 277 mg/dL — AB (ref 65–99)
Glucose-Capillary: 376 mg/dL — ABNORMAL HIGH (ref 65–99)
Glucose-Capillary: 348 mg/dL — ABNORMAL HIGH (ref 65–99)

## 2016-03-01 MED ORDER — SODIUM CHLORIDE 0.9 % IV BOLUS (SEPSIS)
1000.0000 mL | Freq: Once | INTRAVENOUS | Status: AC
  Administered 2016-03-01: 1000 mL via INTRAVENOUS

## 2016-03-01 MED ORDER — ACETAMINOPHEN 500 MG PO TABS
1000.0000 mg | ORAL_TABLET | Freq: Once | ORAL | Status: AC
  Administered 2016-03-01: 1000 mg via ORAL
  Filled 2016-03-01: qty 2

## 2016-03-01 NOTE — ED Notes (Signed)
Patient ambulatory to restroom  ?

## 2016-03-01 NOTE — ED Provider Notes (Signed)
[facility] DEPT [facility] Provider Note   CSN: [id number] Arrival date & time: 03/01/16  1358     History   Chief Complaint Chief Complaint  Patient presents with  . Hyperglycemia    HPI Vernon Reed is a 40 y.o. male.  HPI   Vernon Reed is a 40 y.o. male, with a history of DM, HTN, and CHF, presenting to the ED with fatigue, malaise, and nausea for the past month. Pt adds, "That is how I feel when my blood sugar is too high so I know that's what's wrong." Pt states he was keeping his blood sugar under 100. States about a month ago, he began to "eat bad" and stopped taking his metformin. He adds, "I thought once my blood sugar was good, I thought I didn't need to worry about it anymore."  Denies fever/chills, vomiting, chest pain, shortness of breath, abdominal pain, or any other complaints.     Past Medical History:  Diagnosis Date  . CHF (congestive heart failure) ([place])   . GERD (gastroesophageal reflux disease)   . Headache    "maybe 5 times/yr; nothing regular" (12/06/2014)  . Hyperlipemia   . Hypertension   . OSA (obstructive sleep apnea)    "should wear mask but I don't have one" (12/06/2014)  . Type II diabetes mellitus [facility])     Patient Active Problem List   Diagnosis Date Noted  . Type II diabetes mellitus ([place]) 12/11/2014  . Acute exacerbation of CHF (congestive heart failure) ([place])   . Hypertensive urgency   . CHF (congestive heart failure) ([place]) 12/06/2014  . Hypoxia 12/06/2014  . Essential hypertension   . Acute systolic CHF (congestive heart failure) ([place])   . OSA (obstructive sleep apnea) 01/10/2014  . Chronic systolic heart failure ([place]) 01/04/2014  . Obesity, morbid ([place]) 12/27/2013  . AKI (acute kidney injury) ([place]) 07/27/2013  . Tachy-brady syndrome ([place]) 07/26/2013  . Hypokalemia 07/26/2013  . Cardiomyopathy ([place]) 07/25/2013  . Heart block 07/25/2013  . Tachycardia 07/23/2013  . Hypertension 07/23/2013  . Type II or unspecified type  diabetes mellitus without mention of complication, not stated as uncontrolled 07/23/2013    Past Surgical History:  Procedure Laterality Date  . 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 APPENDECTOMY  ~ 1989  . CARDIAC CATHETERIZATION  07/2013  . LEFT AND RIGHT HEART CATHETERIZATION WITH CORONARY ANGIOGRAM N/A 07/26/2013   Procedure: LEFT AND RIGHT HEART CATHETERIZATION WITH CORONARY ANGIOGRAM;  Surgeon: Leonie Man, MD;  Location: [facility] CATH LAB;  Service: Cardiovascular;  Laterality: N/A;    OB History    No data available       Home Medications    Prior to Admission medications   Medication Sig Start Date End Date Taking? Authorizing Provider  Blood Glucose Monitoring Suppl (TRUE METRIX METER) W/DEVICE KIT 1 each by Does not apply route once as needed. 12/11/14  Yes Arnoldo Morale, MD  carvedilol (COREG) 12.5 MG tablet Take 1 tablet (12.5 mg total) by mouth 2 (two) times daily with a meal. 07/16/15  Yes Larey Dresser, MD  Cholecalciferol (VITAMIN D PO) Take 1 tablet by mouth daily.   Yes Historical Provider, MD  furosemide (LASIX) 40 MG tablet Take 1 tablet (40 mg total) by mouth daily. 07/16/15  Yes Larey Dresser, MD  glucose blood test strip USE AS INSTRUCTED ONCE DAILY BEFORE BREAKFAST 01/02/16  Yes Arnoldo Morale, MD  hydrALAZINE (APRESOLINE) 50 MG tablet Take 1.5  tablets (75 mg total) by mouth 3 (three) times daily. 07/16/15  Yes Larey Dresser, MD  isosorbide mononitrate (IMDUR) 60 MG 24 hr tablet Take 1 tablet (60 mg total) by mouth daily. 07/16/15  Yes Larey Dresser, MD  losartan (COZAAR) 25 MG tablet Take 1 tablet (25 mg total) by mouth daily. 07/16/15  Yes Larey Dresser, MD  metFORMIN (GLUCOPHAGE) 500 MG tablet Take 2 tablets (1,000 mg total) by mouth 2 (two) times daily with a meal. 07/17/15  Yes Arnoldo Morale, MD  potassium chloride (K-DUR) 10 MEQ tablet Take 1 tablet (10 mEq total) by mouth daily. 07/16/15  Yes Larey Dresser, MD  pravastatin  (PRAVACHOL) 80 MG tablet Take 1 tablet (80 mg total) by mouth daily. 07/16/15  Yes Larey Dresser, MD  spironolactone (ALDACTONE) 25 MG tablet Take 1 tablet (25 mg total) by mouth daily. 07/16/15  Yes Larey Dresser, MD  TRUEPLUS LANCETS 30G MISC 1 each by Does not apply route every morning. 12/11/14  Yes Arnoldo Morale, MD  aspirin 81 MG EC tablet Take 1 tablet (81 mg total) by mouth daily. 07/16/15   Larey Dresser, MD    Family History Family History  Problem Relation Age of Onset  . Hypertension Mother   . Hypertension Father   . Sleep apnea Father     Social History Social History  Substance Use Topics  . Smoking status: Never Smoker  . Smokeless tobacco: Never Used  . Alcohol use No     Comment: 12/06/2014 "might have a beer a couple times/month"     Allergies   Review of patient's allergies indicates no known allergies.   Review of Systems Review of Systems  Constitutional: Positive for fatigue. Negative for chills, diaphoresis and fever.  Respiratory: Negative for shortness of breath.   Cardiovascular: Negative for chest pain.  Gastrointestinal: Positive for nausea. Negative for vomiting.  All other systems reviewed and are negative.    Physical Exam Updated Vital Signs BP 121/83   Pulse 74   SpO2 95%   Physical Exam  Constitutional: He appears well-developed and well-nourished. No distress.  HENT:  Head: Normocephalic and atraumatic.  Eyes: Conjunctivae are normal.  Neck: Neck supple.  Cardiovascular: Normal rate, regular rhythm, normal heart sounds and intact distal pulses.   Pulmonary/Chest: Effort normal and breath sounds normal. No respiratory distress.  Abdominal: Soft. There is no tenderness. There is no guarding.  Musculoskeletal: He exhibits no edema or tenderness.  Lymphadenopathy:    He has no cervical adenopathy.  Neurological: He is alert.  Skin: Skin is warm and dry. He is not diaphoretic.  Psychiatric: He has a normal mood and affect. His  behavior is normal.  Nursing note and vitals reviewed.    ED Treatments / Results  Labs (all labs ordered are listed, but only abnormal results are displayed) Labs Reviewed  BASIC METABOLIC PANEL - Abnormal; Notable for the following:       Result Value   Sodium 131 (*)    Chloride 97 (*)    Glucose, Bld 390 (*)    BUN 30 (*)    Creatinine, Ser 1.61 (*)    GFR calc non Af Amer 52 (*)    GFR calc Af Amer 60 (*)    All other components within normal limits  URINALYSIS, ROUTINE W REFLEX MICROSCOPIC (NOT AT [facility]) - Abnormal; Notable for the following:    Glucose, UA >1000 (*)    All other components within  normal limits  URINE MICROSCOPIC-ADD ON - Abnormal; Notable for the following:    Squamous Epithelial / LPF 0-5 (*)    Bacteria, UA FEW (*)    Casts HYALINE CASTS (*)    All other components within normal limits  CBG MONITORING, ED - Abnormal; Notable for the following:    Glucose-Capillary 376 (*)    All other components within normal limits  CBG MONITORING, ED - Abnormal; Notable for the following:    Glucose-Capillary 348 (*)    All other components within normal limits  CBC    EKG  EKG Interpretation None       Radiology No results found.  Procedures Procedures (including critical care time)  Medications Ordered in ED Medications  sodium chloride 0.9 % bolus 1,000 mL (1,000 mLs Intravenous New Bag/Given 03/01/16 1634)  sodium chloride 0.9 % bolus 1,000 mL (0 mLs Intravenous Stopped 03/01/16 1628)     Initial Impression / Assessment and Plan / ED Course  I have reviewed the triage vital signs and the nursing notes.  Pertinent labs & imaging results that were available during my care of the patient were reviewed by me and considered in my medical decision making (see chart for details).  Clinical Course    Patient with hyperglycemia with no signs of diabetic ketoacidosis. Patient is nontoxic appearing and has no signs of sepsis or other serious illness.  Patient improved with IV fluids.  End of shift patient care handoff report given to Irena Cords, PA-C. Plan: Finish IV fluids, reevaluate patient's CBG, discharge with PCP follow-up.  Vitals:   03/01/16 1500 03/01/16 1530 03/01/16 1600  BP: 118/74 114/73 121/83  Pulse: 84 77 74  SpO2: 94% 95% 95%     Final Clinical Impressions(s) / ED Diagnoses   Final diagnoses:  Hyperglycemia    New Prescriptions New Prescriptions   No medications on file     Layla Maw 03/01/16 [place], MD 03/02/16 [id number]

## 2016-03-01 NOTE — Discharge Instructions (Signed)
You have been seen today for hyperglycemia. Your blood sugar was over 300 today. Be sure to get back on your diabetic friendly diet and take your medications as prescribed. Follow up with PCP as soon as possible for follow-up. Return to ED should symptoms worsen.

## 2016-03-01 NOTE — ED Triage Notes (Signed)
Pt states he has been feeling bad for several days.  Pt has not been testing his blood sugar.

## 2016-03-12 ENCOUNTER — Encounter: Payer: Self-pay | Admitting: Family Medicine

## 2016-03-12 ENCOUNTER — Ambulatory Visit: Payer: Self-pay | Attending: Family Medicine | Admitting: Family Medicine

## 2016-03-12 VITALS — BP 116/76 | HR 70 | Temp 98.5°F | Ht 72.0 in | Wt 291.4 lb

## 2016-03-12 DIAGNOSIS — I5022 Chronic systolic (congestive) heart failure: Secondary | ICD-10-CM

## 2016-03-12 DIAGNOSIS — Z7984 Long term (current) use of oral hypoglycemic drugs: Secondary | ICD-10-CM | POA: Insufficient documentation

## 2016-03-12 DIAGNOSIS — I429 Cardiomyopathy, unspecified: Secondary | ICD-10-CM

## 2016-03-12 DIAGNOSIS — E669 Obesity, unspecified: Secondary | ICD-10-CM | POA: Insufficient documentation

## 2016-03-12 DIAGNOSIS — I110 Hypertensive heart disease with heart failure: Secondary | ICD-10-CM | POA: Insufficient documentation

## 2016-03-12 DIAGNOSIS — E1165 Type 2 diabetes mellitus with hyperglycemia: Secondary | ICD-10-CM

## 2016-03-12 DIAGNOSIS — K219 Gastro-esophageal reflux disease without esophagitis: Secondary | ICD-10-CM | POA: Insufficient documentation

## 2016-03-12 DIAGNOSIS — G4733 Obstructive sleep apnea (adult) (pediatric): Secondary | ICD-10-CM

## 2016-03-12 DIAGNOSIS — Z6839 Body mass index (BMI) 39.0-39.9, adult: Secondary | ICD-10-CM | POA: Insufficient documentation

## 2016-03-12 DIAGNOSIS — Z7982 Long term (current) use of aspirin: Secondary | ICD-10-CM | POA: Insufficient documentation

## 2016-03-12 LAB — POCT GLYCOSYLATED HEMOGLOBIN (HGB A1C): Hemoglobin A1C: 11

## 2016-03-12 LAB — GLUCOSE, POCT (MANUAL RESULT ENTRY): POC Glucose: 113 mg/dl — AB (ref 70–99)

## 2016-03-12 MED ORDER — METFORMIN HCL 500 MG PO TABS: 1000.0000 mg | ORAL_TABLET | Freq: Two times a day (BID) | ORAL | 5 refills | Status: DC

## 2016-03-12 NOTE — Progress Notes (Signed)
Subjective:    Patient ID: Vernon Reed, male    DOB: [birth year], 40 y.o.   MRN: [id number]  HPI 40 year old male with a history of type 2 diabetes mellitus (A1c 11.0 from today), nonischemic cardiomyopathy (EF 25% from 12/2015), obstructive sleep apnea, hypertension who comes into the clinic for follow-up visit.  He was last seen in 07/2015 at which time his A1c was 11.5 and he had been off his medications. He resumed medications and was doing well but then suddenly stopped adhering to a diabetic diet and stopped his medications as well a few months back. This led him to present to the ED, a few days ago with hyperglycemia of greater than 300 for which he received IV fluids and was subsequently discharged.  He informs me he has gotten back on track and is taking his metformin as prescribed. Denies numbness Remedies of blurry vision. Has not been to see cardiologist in a while; denies chest pains or shortness of breath.  Past Medical History:  Diagnosis Date  . CHF (congestive heart failure) ([place])   . GERD (gastroesophageal reflux disease)   . Headache    "maybe 5 times/yr; nothing regular" (12/06/2014)  . Hyperlipemia   . Hypertension   . OSA (obstructive sleep apnea)    "should wear mask but I don't have one" (12/06/2014)  . Type II diabetes mellitus ([place])     Past Surgical History:  Procedure Laterality Date  . 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 APPENDECTOMY  ~ 1989  . CARDIAC CATHETERIZATION  07/2013  . LEFT AND RIGHT HEART CATHETERIZATION WITH CORONARY ANGIOGRAM N/A 07/26/2013   Procedure: LEFT AND RIGHT HEART CATHETERIZATION WITH CORONARY ANGIOGRAM;  Surgeon: Leonie Man, MD;  Location: [facility] CATH LAB;  Service: Cardiovascular;  Laterality: N/A;    No Known Allergies  Current Outpatient Prescriptions on File Prior to Visit  Medication Sig Dispense Refill  . aspirin 81 MG EC tablet Take 1 tablet (81 mg total) by mouth daily. 30 tablet 6    . Blood Glucose Monitoring Suppl (TRUE METRIX METER) W/DEVICE KIT 1 each by Does not apply route once as needed. 1 kit 0  . carvedilol (COREG) 12.5 MG tablet Take 1 tablet (12.5 mg total) by mouth 2 (two) times daily with a meal. 60 tablet 6  . Cholecalciferol (VITAMIN D PO) Take 1 tablet by mouth daily.    . furosemide (LASIX) 40 MG tablet Take 1 tablet (40 mg total) by mouth daily. 30 tablet 6  . glucose blood test strip USE AS INSTRUCTED ONCE DAILY BEFORE BREAKFAST 50 each 12  . hydrALAZINE (APRESOLINE) 50 MG tablet Take 1.5 tablets (75 mg total) by mouth 3 (three) times daily. 135 tablet 6  . isosorbide mononitrate (IMDUR) 60 MG 24 hr tablet Take 1 tablet (60 mg total) by mouth daily. 30 tablet 6  . losartan (COZAAR) 25 MG tablet Take 1 tablet (25 mg total) by mouth daily. 30 tablet 1  . potassium chloride (K-DUR) 10 MEQ tablet Take 1 tablet (10 mEq total) by mouth daily. 30 tablet 6  . pravastatin (PRAVACHOL) 80 MG tablet Take 1 tablet (80 mg total) by mouth daily. 30 tablet 6  . spironolactone (ALDACTONE) 25 MG tablet Take 1 tablet (25 mg total) by mouth daily. 30 tablet 6  . TRUEPLUS LANCETS 30G MISC 1 each by Does not apply route every morning. 50 each 11   No current facility-administered medications on file  prior to visit.      Review of Systems  Constitutional: Negative for activity change and appetite change.  HENT: Negative for sinus pressure and sore throat.   Eyes: Negative for visual disturbance.  Respiratory: Negative for cough, chest tightness and shortness of breath.   Cardiovascular: Negative for chest pain and leg swelling.  Gastrointestinal: Negative for abdominal distention, abdominal pain, constipation and diarrhea.  Endocrine: Negative.   Genitourinary: Negative for dysuria.  Musculoskeletal: Negative for joint swelling and myalgias.  Skin: Negative for rash.  Allergic/Immunologic: Negative.   Neurological: Negative for weakness, light-headedness and numbness.   Psychiatric/Behavioral: Negative for dysphoric mood and suicidal ideas.       Objective: Vitals:   03/12/16 1616  BP: 116/76  Pulse: 70  Temp: 98.5 F (36.9 C)  TempSrc: Oral  SpO2: 97%  Weight: 291 lb 6.4 oz (132.2 kg)  Height: 6' (1.829 m)      Physical Exam  Constitutional: He is oriented to person, place, and time. He appears well-developed and well-nourished.  Cardiovascular: Normal rate, normal heart sounds and intact distal pulses.   No murmur heard. Pulmonary/Chest: Effort normal and breath sounds normal. He has no wheezes. He has no rales. He exhibits no tenderness.  Abdominal: Soft. Bowel sounds are normal. He exhibits no distension and no mass. There is no tenderness.  Musculoskeletal: Normal range of motion.  Neurological: He is alert and oriented to person, place, and time.          Assessment & Plan:  1. Type 2 diabetes mellitus with hyperglycemia, without long-term current use of insulin (HCC) A1c of 11.0 Due to noncompliance Stressed the importance of compliance and he will keep a blood sugar log which will be reviewed at his next visit. If he still has hyperglycemia glipizide to be added to regimen. Up-to-date on eye exams. Continue metformin - Glucose (CBG)   2. Other secondary hypertension Controlled. Continue antihypertensives  3. Cardiomyopathy ([place]) EF 25% Nonischemic cardiomyopathy. Not in acute failure at this time Advised to schedule appointment with cardiology  4. OSA (obstructive sleep apnea) Stable  5. Obesity Advised on weight loss, reducing portion sizes and exercise

## 2016-03-12 NOTE — Patient Instructions (Signed)
Diabetes Mellitus and Food It is important for you to manage your blood sugar (glucose) level. Your blood glucose level can be greatly affected by what you eat. Eating healthier foods in the appropriate amounts throughout the day at about the same time each day will help you control your blood glucose level. It can also help slow or prevent worsening of your diabetes mellitus. Healthy eating may even help you improve the level of your blood pressure and reach or maintain a healthy weight.  General recommendations for healthful eating and cooking habits include:  Eating meals and snacks regularly. Avoid going long periods of time without eating to lose weight.  Eating a diet that consists mainly of plant-based foods, such as fruits, vegetables, nuts, legumes, and whole grains.  Using low-heat cooking methods, such as baking, instead of high-heat cooking methods, such as deep frying. Work with your dietitian to make sure you understand how to use the Nutrition Facts information on food labels. HOW CAN FOOD AFFECT ME? Carbohydrates Carbohydrates affect your blood glucose level more than any other type of food. Your dietitian will help you determine how many carbohydrates to eat at each meal and teach you how to count carbohydrates. Counting carbohydrates is important to keep your blood glucose at a healthy level, especially if you are using insulin or taking certain medicines for diabetes mellitus. Alcohol Alcohol can cause sudden decreases in blood glucose (hypoglycemia), especially if you use insulin or take certain medicines for diabetes mellitus. Hypoglycemia can be a life-threatening condition. Symptoms of hypoglycemia (sleepiness, dizziness, and disorientation) are similar to symptoms of having too much alcohol.  If your health care provider has given you approval to drink alcohol, do so in moderation and use the following guidelines:  Women should not have more than one drink per day, and men  should not have more than two drinks per day. One drink is equal to:  12 oz of beer.  5 oz of wine.  1 oz of hard liquor.  Do not drink on an empty stomach.  Keep yourself hydrated. Have water, diet soda, or unsweetened iced tea.  Regular soda, juice, and other mixers might contain a lot of carbohydrates and should be counted. WHAT FOODS ARE NOT RECOMMENDED? As you make food choices, it is important to remember that all foods are not the same. Some foods have fewer nutrients per serving than other foods, even though they might have the same number of calories or carbohydrates. It is difficult to get your body what it needs when you eat foods with fewer nutrients. Examples of foods that you should avoid that are high in calories and carbohydrates but low in nutrients include:  Trans fats (most processed foods list trans fats on the Nutrition Facts label).  Regular soda.  Juice.  Candy.  Sweets, such as cake, pie, doughnuts, and cookies.  Fried foods. WHAT FOODS CAN I EAT? Eat nutrient-rich foods, which will nourish your body and keep you healthy. The food you should eat also will depend on several factors, including:  The calories you need.  The medicines you take.  Your weight.  Your blood glucose level.  Your blood pressure level.  Your cholesterol level. You should eat a variety of foods, including:  Protein.  Lean cuts of meat.  Proteins low in saturated fats, such as fish, egg whites, and beans. Avoid processed meats.  Fruits and vegetables.  Fruits and vegetables that may help control blood glucose levels, such as apples, mangoes, and   yams.  Dairy products.  Choose fat-free or low-fat dairy products, such as milk, yogurt, and cheese.  Grains, bread, pasta, and rice.  Choose whole grain products, such as multigrain bread, whole oats, and brown rice. These foods may help control blood pressure.  Fats.  Foods containing healthful fats, such as nuts,  avocado, olive oil, canola oil, and fish. DOES EVERYONE WITH DIABETES MELLITUS HAVE THE SAME MEAL PLAN? Because every person with diabetes mellitus is different, there is not one meal plan that works for everyone. It is very important that you meet with a dietitian who will help you create a meal plan that is just right for you.   This information is not intended to replace advice given to you by your health care provider. Make sure you discuss any questions you have with your health care provider.   Document Released: 03/20/2005 Document Revised: 07/14/2014 Document Reviewed: 05/20/2013 Elsevier Interactive Patient Education 2016 Elsevier Inc.  

## 2016-03-13 MED FILL — ?METFORMIN HCL 500MG TABLET: 500 | 30 days supply | Qty: 120 | Fill #0

## 2016-03-21 MED FILL — ?CARVEDILOL 12.5 MG TABLET: 12.5 | 30 days supply | Qty: 60 | Fill #5

## 2016-03-21 MED FILL — POTASSIUM CL 10 MEQ TAB SA: 10 | 30 days supply | Qty: 30 | Fill #5

## 2016-03-21 MED FILL — LOSARTAN POTASSIUM 25 MG TA: 25 | 30 days supply | Qty: 30 | Fill #2

## 2016-03-21 MED FILL — ISOSORBIDE MN ER 60 MG TAB: 60 | 30 days supply | Qty: 30 | Fill #4

## 2016-03-21 MED FILL — ?FUROSEMIDE 40 MG TABLET: 40 | 30 days supply | Qty: 30 | Fill #5

## 2016-03-21 MED FILL — SPIRONOLACTONE 25 MG TABLET: 25 | 30 days supply | Qty: 30 | Fill #5

## 2016-04-01 ENCOUNTER — Inpatient Hospital Stay (HOSPITAL_COMMUNITY): Admission: RE | Admit: 2016-04-01 | Payer: Self-pay | Source: Ambulatory Visit

## 2016-04-08 ENCOUNTER — Ambulatory Visit: Payer: Self-pay | Attending: Family Medicine | Admitting: Family Medicine

## 2016-04-08 ENCOUNTER — Encounter: Payer: Self-pay | Admitting: Family Medicine

## 2016-04-08 VITALS — BP 136/81 | HR 89 | Temp 98.2°F | Resp 20 | Ht 72.0 in | Wt 300.0 lb

## 2016-04-08 DIAGNOSIS — I428 Other cardiomyopathies: Secondary | ICD-10-CM

## 2016-04-08 DIAGNOSIS — I429 Cardiomyopathy, unspecified: Secondary | ICD-10-CM | POA: Insufficient documentation

## 2016-04-08 DIAGNOSIS — E0800 Diabetes mellitus due to underlying condition with hyperosmolarity without nonketotic hyperglycemic-hyperosmolar coma (NKHHC): Secondary | ICD-10-CM

## 2016-04-08 DIAGNOSIS — I509 Heart failure, unspecified: Secondary | ICD-10-CM | POA: Insufficient documentation

## 2016-04-08 DIAGNOSIS — K219 Gastro-esophageal reflux disease without esophagitis: Secondary | ICD-10-CM | POA: Insufficient documentation

## 2016-04-08 DIAGNOSIS — G4733 Obstructive sleep apnea (adult) (pediatric): Secondary | ICD-10-CM | POA: Insufficient documentation

## 2016-04-08 DIAGNOSIS — E669 Obesity, unspecified: Secondary | ICD-10-CM | POA: Insufficient documentation

## 2016-04-08 DIAGNOSIS — Z7982 Long term (current) use of aspirin: Secondary | ICD-10-CM | POA: Insufficient documentation

## 2016-04-08 DIAGNOSIS — E1165 Type 2 diabetes mellitus with hyperglycemia: Secondary | ICD-10-CM | POA: Insufficient documentation

## 2016-04-08 DIAGNOSIS — Z79899 Other long term (current) drug therapy: Secondary | ICD-10-CM | POA: Insufficient documentation

## 2016-04-08 DIAGNOSIS — I10 Essential (primary) hypertension: Secondary | ICD-10-CM | POA: Insufficient documentation

## 2016-04-08 LAB — GLUCOSE, POCT (MANUAL RESULT ENTRY): POC Glucose: 122 mg/dl — AB (ref 70–99)

## 2016-04-08 MED ORDER — METFORMIN HCL 500 MG PO TABS: 500.0000 mg | ORAL_TABLET | Freq: Two times a day (BID) | ORAL | 5 refills | Status: DC

## 2016-04-08 MED ORDER — PRAVASTATIN SODIUM 80 MG PO TABS: 80.0000 mg | ORAL_TABLET | Freq: Every day | ORAL | 6 refills | Status: DC

## 2016-04-08 MED ORDER — GLUCOSE BLOOD VI STRP: ORAL_STRIP | 12 refills | Status: AC

## 2016-04-08 NOTE — Progress Notes (Signed)
CC: Follow-up on diabetes mellitus  HPI: Vernon Reed is a 40 y.o. male with a history of type 2 diabetes mellitus (A1c 11.0 ), nonischemic cardiomyopathy (EF 25% from 12/2015), obstructive sleep apnea, hypertension who comes into the clinic for a follow-up visit.  At his last visit he had committed to being more compliant with his diet and medications and he has his blood sugar log with him which reveals sugars under 160 and he has had fasting sugars in the 66-101 range. He has had symptomatic hypoglycemia which he notice  some lightheadedness on one occasion but then went on to eat something. His chart reveals he is on metformin 1000 mg twice daily however he informs me he has only been taking 500 mg twice daily.   Patient has No headache, No chest pain, No abdominal pain - No Nausea, No new weakness tingling or numbness, No Cough - SOB. Of note he has gained 9 pounds in the last 1 month and has not kept track of his weight.  No Known Allergies Past Medical History:  Diagnosis Date  . CHF (congestive heart failure) ([place])   . GERD (gastroesophageal reflux disease)   . Headache    "maybe 5 times/yr; nothing regular" (12/06/2014)  . Hyperlipemia   . Hypertension   . OSA (obstructive sleep apnea)    "should wear mask but I don't have one" (12/06/2014)  . Type II diabetes mellitus ([place])    Current Outpatient Prescriptions on File Prior to Visit  Medication Sig Dispense Refill  . aspirin 81 MG EC tablet Take 1 tablet (81 mg total) by mouth daily. 30 tablet 6  . carvedilol (COREG) 12.5 MG tablet Take 1 tablet (12.5 mg total) by mouth 2 (two) times daily with a meal. 60 tablet 6  . Cholecalciferol (VITAMIN D PO) Take 1 tablet by mouth daily.    . furosemide (LASIX) 40 MG tablet Take 1 tablet (40 mg total) by mouth daily. 30 tablet 6  . hydrALAZINE (APRESOLINE) 50 MG tablet Take 1.5 tablets (75 mg total) by mouth 3 (three) times daily. 135 tablet 6  . isosorbide mononitrate (IMDUR) 60 MG  24 hr tablet Take 1 tablet (60 mg total) by mouth daily. 30 tablet 6  . losartan (COZAAR) 25 MG tablet Take 1 tablet (25 mg total) by mouth daily. 30 tablet 1  . metFORMIN (GLUCOPHAGE) 500 MG tablet Take 2 tablets (1,000 mg total) by mouth 2 (two) times daily with a meal. 120 tablet 5  . potassium chloride (K-DUR) 10 MEQ tablet Take 1 tablet (10 mEq total) by mouth daily. 30 tablet 6  . pravastatin (PRAVACHOL) 80 MG tablet Take 1 tablet (80 mg total) by mouth daily. 30 tablet 6  . spironolactone (ALDACTONE) 25 MG tablet Take 1 tablet (25 mg total) by mouth daily. 30 tablet 6  . Blood Glucose Monitoring Suppl (TRUE METRIX METER) W/DEVICE KIT 1 each by Does not apply route once as needed. 1 kit 0  . glucose blood test strip USE AS INSTRUCTED ONCE DAILY BEFORE BREAKFAST 50 each 12  . TRUEPLUS LANCETS 30G MISC 1 each by Does not apply route every morning. 50 each 11   No current facility-administered medications on file prior to visit.    Family History  Problem Relation Age of Onset  . Hypertension Mother   . Hypertension Father   . Sleep apnea Father    Social History   Social History  . Marital status: Divorced    Spouse name:  N/A  . Number of children: 3  . Years of education: N/A   Occupational History  . TEFL teacher Dx   Social History Main Topics  . Smoking status: Never Smoker  . Smokeless tobacco: Never Used  . Alcohol use No     Comment: 12/06/2014 "might have a beer a couple times/month"  . Drug use: No  . Sexual activity: Yes   Other Topics Concern  . Not on file   Social History Narrative  . No narrative on file    Review of Systems: Constitutional: Negative for fever, chills, diaphoresis, activity change, appetite change and fatigue. HENT: Negative for ear pain, nosebleeds, congestion, facial swelling, rhinorrhea, neck pain, neck stiffness and ear discharge.  Eyes: Negative for pain, discharge, redness, itching and visual disturbance. Respiratory:  Negative for cough, choking, chest tightness, shortness of breath, wheezing and stridor.  Cardiovascular: Negative for chest pain, palpitations and leg swelling. Gastrointestinal: Negative for abdominal distention. Genitourinary: Negative for dysuria, urgency, frequency, hematuria, flank pain, decreased urine volume, difficulty urinating and dyspareunia.  Musculoskeletal: Negative for back pain, joint swelling, arthralgias and gait problem. Neurological: Negative for dizziness, tremors, seizures, syncope, facial asymmetry, speech difficulty, weakness, light-headedness, numbness and headaches.  Hematological: Negative for adenopathy. Does not bruise/bleed easily. Psychiatric/Behavioral: Negative for hallucinations, behavioral problems, confusion, dysphoric mood, decreased concentration and agitation.    Objective:   Vitals:   04/08/16 1413  BP: 136/81  Pulse: 89  Resp: 20  Temp: 98.2 F (36.8 C)   Filed Weights   04/08/16 1413  Weight: 300 lb (136.1 kg)    Physical Exam: Constitutional: Patient appears obese, well-developed and well-nourished. No distress. HENT: Normocephalic, atraumatic, External right and left ear normal. Oropharynx is clear and moist.  Eyes: Conjunctivae and EOM are normal. PERRLA, no scleral icterus. Neck: Normal ROM. Neck supple. No JVD. No tracheal deviation. No thyromegaly. CVS: RRR, S1/S2 +, no murmurs, no gallops, no carotid bruit.  Pulmonary: Effort and breath sounds normal, no stridor, rhonchi, wheezes, rales.  Abdominal: Soft. BS +,  no distension, tenderness, rebound or guarding.  Musculoskeletal: Normal range of motion. No edema and no tenderness.  Lymphadenopathy: No lymphadenopathy noted, cervical, inguinal or axillary Neuro: Alert. Normal reflexes, muscle tone coordination. No cranial nerve deficit. Skin: Skin is warm and dry. No rash noted. Not diaphoretic. No erythema. No pallor. Psychiatric: Normal mood and affect. Behavior, judgment, thought  content normal.  Lab Results  Component Value Date   WBC 9.8 03/01/2016   HGB 13.9 03/01/2016   HCT 40.3 03/01/2016   MCV 88.4 03/01/2016   PLT 259 03/01/2016   Lab Results  Component Value Date   CREATININE 1.61 (H) 03/01/2016   BUN 30 (H) 03/01/2016   NA 131 (L) 03/01/2016   K 4.2 03/01/2016   CL 97 (L) 03/01/2016   CO2 25 03/01/2016    Lab Results  Component Value Date   HGBA1C 11.0 03/12/2016   Lipid Panel     Component Value Date/Time   CHOL 165 12/07/2014 0539   TRIG 134 12/07/2014 0539   HDL 22 (L) 12/07/2014 0539   CHOLHDL 7.5 12/07/2014 0539   VLDL 27 12/07/2014 0539   LDLCALC 116 (H) 12/07/2014 0539       Assessment and plan:  1. Type 2 diabetes mellitus with hyperglycemia, without long-term current use of insulin (HCC) A1c of 11.0 Due to noncompliance Blood sugar log reveals improvement and he does have some lows of 66-70 despite taking 500 mg of metformin  twice daily. If this persists we will have to cut back to 500 mg daily - Glucose (CBG)   2. Essential hypertension Controlled. Continue antihypertensives  3. Cardiomyopathy ([place]) EF 25% Nonischemic cardiomyopathy. Not in acute failure at this time He has gained 9 pounds in the last 1 month Advised to keep check on his weight and if this increases he may need an increase in dose of Lasix Fluid restriction to less than 2 L per day, low sodium, cardiac diet. Advised to schedule appointment with cardiology  4. OSA (obstructive sleep apnea) Stable  5. Obesity Advised on weight loss, reducing portion sizes and exercise        Arnoldo Morale, MD. [facility] [phone] 04/08/2016, 2:22 PM

## 2016-04-08 NOTE — Progress Notes (Signed)
F/u diabetes. Refill glucometer strips and Pravachol.

## 2016-04-11 ENCOUNTER — Telehealth (HOSPITAL_COMMUNITY): Payer: Self-pay | Admitting: Vascular Surgery

## 2016-04-11 ENCOUNTER — Encounter (HOSPITAL_COMMUNITY): Payer: Self-pay

## 2016-04-11 ENCOUNTER — Ambulatory Visit (HOSPITAL_COMMUNITY)
Admission: RE | Admit: 2016-04-11 | Discharge: 2016-04-11 | Disposition: A | Discharge status: Home / Self Care | Payer: Self-pay | Source: Ambulatory Visit | Attending: Cardiology | Admitting: Cardiology

## 2016-04-11 VITALS — BP 152/102 | HR 70 | Wt 296.0 lb

## 2016-04-11 DIAGNOSIS — I158 Other secondary hypertension: Secondary | ICD-10-CM

## 2016-04-11 DIAGNOSIS — E785 Hyperlipidemia, unspecified: Secondary | ICD-10-CM | POA: Insufficient documentation

## 2016-04-11 DIAGNOSIS — R0602 Shortness of breath: Secondary | ICD-10-CM | POA: Insufficient documentation

## 2016-04-11 DIAGNOSIS — K219 Gastro-esophageal reflux disease without esophagitis: Secondary | ICD-10-CM | POA: Insufficient documentation

## 2016-04-11 DIAGNOSIS — Z8249 Family history of ischemic heart disease and other diseases of the circulatory system: Secondary | ICD-10-CM | POA: Insufficient documentation

## 2016-04-11 DIAGNOSIS — N182 Chronic kidney disease, stage 2 (mild): Secondary | ICD-10-CM | POA: Insufficient documentation

## 2016-04-11 DIAGNOSIS — Z7982 Long term (current) use of aspirin: Secondary | ICD-10-CM | POA: Insufficient documentation

## 2016-04-11 DIAGNOSIS — E1122 Type 2 diabetes mellitus with diabetic chronic kidney disease: Secondary | ICD-10-CM | POA: Insufficient documentation

## 2016-04-11 DIAGNOSIS — I5022 Chronic systolic (congestive) heart failure: Secondary | ICD-10-CM

## 2016-04-11 DIAGNOSIS — G4733 Obstructive sleep apnea (adult) (pediatric): Secondary | ICD-10-CM

## 2016-04-11 DIAGNOSIS — N179 Acute kidney failure, unspecified: Secondary | ICD-10-CM | POA: Insufficient documentation

## 2016-04-11 DIAGNOSIS — Z7984 Long term (current) use of oral hypoglycemic drugs: Secondary | ICD-10-CM | POA: Insufficient documentation

## 2016-04-11 DIAGNOSIS — Z9119 Patient's noncompliance with other medical treatment and regimen: Secondary | ICD-10-CM | POA: Insufficient documentation

## 2016-04-11 DIAGNOSIS — I130 Hypertensive heart and chronic kidney disease with heart failure and stage 1 through stage 4 chronic kidney disease, or unspecified chronic kidney disease: Secondary | ICD-10-CM | POA: Insufficient documentation

## 2016-04-11 DIAGNOSIS — E1165 Type 2 diabetes mellitus with hyperglycemia: Secondary | ICD-10-CM

## 2016-04-11 DIAGNOSIS — Z6841 Body Mass Index (BMI) 40.0 and over, adult: Secondary | ICD-10-CM | POA: Insufficient documentation

## 2016-04-11 DIAGNOSIS — E669 Obesity, unspecified: Secondary | ICD-10-CM | POA: Insufficient documentation

## 2016-04-11 LAB — BASIC METABOLIC PANEL
ANION GAP: 11 (ref 5–15)
BUN: 15 mg/dL (ref 6–20)
CALCIUM: 9.4 mg/dL (ref 8.9–10.3)
CO2: 28 mmol/L (ref 22–32)
CREATININE: 1.18 mg/dL (ref 0.61–1.24)
Chloride: 101 mmol/L (ref 101–111)
Glucose, Bld: 128 mg/dL — ABNORMAL HIGH (ref 65–99)
Potassium: 3.7 mmol/L (ref 3.5–5.1)
SODIUM: 140 mmol/L (ref 135–145)

## 2016-04-11 LAB — BRAIN NATRIURETIC PEPTIDE: B NATRIURETIC PEPTIDE 5: 17.7 pg/mL (ref 0.0–100.0)

## 2016-04-11 NOTE — Telephone Encounter (Signed)
sent message to Trina Ao to call pt for echo

## 2016-04-11 NOTE — Patient Instructions (Signed)
Routine lab work today. Will notify you of abnormal results, otherwise no news is good news!  No changes in medication today.  Will schedule you for an echocardiogram at [facility]. Address: [address] ([address]), [place], [place] [address]  Phone: [phone]  Follow up 4 weeks with Otilio Saber, PA-C.  Do the following things EVERYDAY: 1) Weigh yourself in the morning before breakfast. Write it down and keep it in a log. 2) Take your medicines as prescribed 3) Eat low salt foods-Limit salt (sodium) to 2000 mg per day.  4) Stay as active as you can everyday 5) Limit all fluids for the day to less than 2 liters

## 2016-04-11 NOTE — Progress Notes (Signed)
Patient ID: [facility], male   DOB: 1976/05/26, 40 y.o.   MRN: [id number]     Advanced Heart Failure Clinic Note   PCP: None--[facility]   HPI: [facility] is a 40 yo male with a history of HTN, HLD, OSA on CPAP, obesity and chronic systolic HF. Admitted to the hospital 1/16-1/20/15 for SOB. Had L/RHC with normal coronaries.   Admitted 6/23-6/25/15 for SOB. He was diuresed and medications restarted which he became hypotensive and were scaled back. He had AKI. Discharge weight was 310 lbs  He returns today for follow up. Last seen 06/2015. Was diagnosed with Diabetes at that time. Has poor follow up overall.    States he felt better so stopped following up, and then "fell off the wagon". Admitted 02/2016 for hyperglycemia. Most recent Hgb A1c 11.0. Does not smoke. Occasional ETOH.  Denies SOBPND/Orthopnea. Denies lightheadedness, dizziness., or CP. Sleeps on 2 pillows chronically for comfort. Not working currently.  No insurance. Uses [facility], says medicine prices are "OK". Takes hydralazine BID some days. Tries to watch fluid and salt intake, but difficult with limited income.  Works as able as a Therapist, nutritional ( keyboards) for money.   RHC/LHC  07/26/13  RA 10 RV 36/10/13 PA 34/16 (23) PCWP 20124 77 Fick CO/CI 6.18/2.4 Normal Cors   ECHO 07/2013: EF 30-35% ECHO 12/28/2013: EF 45-50%, grade 1 DD.  ECHO 6/16: EF 25%, moderate LVH, mild LV dilation, RV mildly dilated with mildly decreased systolic function  Labs 5/0/93 K 4.2 Creatinine 1.64  Labs 6/16 K 3.6, creatinine 1.4, LDL 116, HDL 22  SH: No job currently. ETOH occasionally. No tobacco abuse or drugs.   FH: Mother living: - HTN, HLD       Father- HTN  ROS: All systems negative except as listed in HPI, PMH and Problem List.  Past Medical History:  Diagnosis Date  . CHF (congestive heart failure) ([facility])   . GERD (gastroesophageal reflux disease)   . Headache    "maybe 5 times/yr; nothing regular" (12/06/2014)   . Hyperlipemia   . Hypertension   . OSA (obstructive sleep apnea)    "should wear mask but I don't have one" (12/06/2014)  . Type II diabetes mellitus ([place])     Current Outpatient Prescriptions  Medication Sig Dispense Refill  . aspirin 81 MG EC tablet Take 1 tablet (81 mg total) by mouth daily. 30 tablet 6  . Blood Glucose Monitoring Suppl (TRUE METRIX METER) W/DEVICE KIT 1 each by Does not apply route once as needed. 1 kit 0  . carvedilol (COREG) 12.5 MG tablet Take 1 tablet (12.5 mg total) by mouth 2 (two) times daily with a meal. 60 tablet 6  . Cholecalciferol (VITAMIN D PO) Take 1 tablet by mouth daily.    . furosemide (LASIX) 40 MG tablet Take 1 tablet (40 mg total) by mouth daily. 30 tablet 6  . glucose blood (TRUE METRIX BLOOD GLUCOSE TEST) test strip Used 3 times daily before meals. 100 each 12  . hydrALAZINE (APRESOLINE) 50 MG tablet Take 1.5 tablets (75 mg total) by mouth 3 (three) times daily. 135 tablet 6  . isosorbide mononitrate (IMDUR) 60 MG 24 hr tablet Take 1 tablet (60 mg total) by mouth daily. 30 tablet 6  . losartan (COZAAR) 25 MG tablet Take 1 tablet (25 mg total) by mouth daily. 30 tablet 1  . metFORMIN (GLUCOPHAGE) 500 MG tablet Take 1 tablet (500 mg total) by mouth 2 (two) times daily  with a meal. 60 tablet 5  . potassium chloride (K-DUR) 10 MEQ tablet Take 1 tablet (10 mEq total) by mouth daily. 30 tablet 6  . pravastatin (PRAVACHOL) 80 MG tablet Take 1 tablet (80 mg total) by mouth daily. 30 tablet 6  . spironolactone (ALDACTONE) 25 MG tablet Take 1 tablet (25 mg total) by mouth daily. 30 tablet 6  . TRUEPLUS LANCETS 30G MISC 1 each by Does not apply route every morning. 50 each 11   No current facility-administered medications for this encounter.     Vitals:   04/11/16 0941  BP: (!) 152/102  BP Location: Right Arm  Patient Position: Sitting  Cuff Size: Large  Pulse: 70  SpO2: 98%  Weight: 296 lb (134.3 kg)   Wt Readings from Last 3 Encounters:    04/11/16 296 lb (134.3 kg)  04/08/16 300 lb (136.1 kg)  03/12/16 291 lb 6.4 oz (132.2 kg)    PHYSICAL EXAM: General:  Well appearing. Obese. Ambulated into clinic without difficulty.  HEENT: normal Neck: supple. JVP difficult to assess with body habitus, does not appear elevated.. Carotids 2+ bilaterally; no bruits. No lymphadenopathy or thyromegaly appreciated. Cor: PMI normal. RRR. No M/G/R Lungs: CTAB, normal effort Abdomen: obese, soft, NT, ND, no HSM. No bruits or masses. +BS  Extremities: no cyanosis, clubbing, rash. No peripheral edema.  Neuro: alert & orientedx3, cranial nerves grossly intact. Moves all 4 extremities w/o difficulty. Affect pleasant.  ASSESSMENT & PLAN:  1) Chronic systolic HF: NICM, EF 22% 6/16 echo.   H  ?Hypertensive cardiomyopathy.   - NYHA class II symptoms. - Needs repeat Echo.  - Continue carvedilol 12.5 mg twice a day.   - Continue Lasix 40 mg daily.   - Continue losartan 25 mg daily.  BMET today.  Has been non-compliant with Entresto in the past, plus has no proof of income for [facility] Medication assistance.  - Continue hydralazaine 75 mg TID and imdur 60 mg daily.  Stressed importance of TID dosing of hydralazine with short acting properties.  Cannot afford Bidil.   - Lengthy and frank discussion about medication compliance and his co-morbidities. - Reinforced the need and importance of daily weights, a low sodium diet, and fluid restriction (less than 2 L a day). Instructed to call the HF clinic if weight increases more than 3 lbs overnight or 5 lbs in a week.  2) HTN:   - Meds as above.       3) OSA: Uses CPAP.   4) CKD II - BMET today.  5)  DMII - Following at [facility].  6) Social - Unemployed, No insurance.  Will have HFSW call, and see at next visit.   BMET, BNP today.  Stressed TID dosing of hydralazine. Repeat Echo.  Follow up 4 weeks for med titration and to meet with HFSW.   Satira Mccallum Jaramiah Bossard  PA-C 04/11/2016

## 2016-04-21 ENCOUNTER — Other Ambulatory Visit (HOSPITAL_COMMUNITY): Payer: Self-pay | Admitting: Adult Health

## 2016-04-21 MED FILL — ISOSORBIDE MN ER 60 MG TAB: 60 | 30 days supply | Qty: 30 | Fill #5

## 2016-04-21 MED FILL — POTASSIUM CL 10 MEQ TAB SA: 10 | 30 days supply | Qty: 30 | Fill #6

## 2016-04-21 MED FILL — SPIRONOLACTONE 25 MG TABLET: 25 | 30 days supply | Qty: 30 | Fill #6

## 2016-04-21 MED FILL — TRUE METRIX TEST STRIP: 10 days supply | Qty: 50 | Fill #1

## 2016-04-21 MED FILL — FUROSEMIDE 40 MG TABLET: 40 | 30 days supply | Qty: 30 | Fill #6

## 2016-04-22 ENCOUNTER — Telehealth: Payer: Self-pay | Admitting: Licensed Clinical Social Worker

## 2016-04-22 MED FILL — LOSARTAN POTASSIUM 25 MG TA: 25 | 30 days supply | Qty: 30 | Fill #0

## 2016-04-22 NOTE — Telephone Encounter (Signed)
CSW was referred for insurance assistance. CSW contacted patient and gave information on applying for medicaid and the orange card program. Pt stated he would apply and verbalized understanding of infomation given to him and stated he had no more pressing issues. CSW will continue to be available as needed.Lasandra Beech, LCSW [phone]

## 2016-04-25 ENCOUNTER — Other Ambulatory Visit: Payer: Self-pay

## 2016-04-25 ENCOUNTER — Ambulatory Visit (HOSPITAL_COMMUNITY): Payer: Self-pay | Attending: Cardiology

## 2016-04-25 DIAGNOSIS — E785 Hyperlipidemia, unspecified: Secondary | ICD-10-CM | POA: Insufficient documentation

## 2016-04-25 DIAGNOSIS — E119 Type 2 diabetes mellitus without complications: Secondary | ICD-10-CM | POA: Insufficient documentation

## 2016-04-25 DIAGNOSIS — I351 Nonrheumatic aortic (valve) insufficiency: Secondary | ICD-10-CM | POA: Insufficient documentation

## 2016-04-25 DIAGNOSIS — G4733 Obstructive sleep apnea (adult) (pediatric): Secondary | ICD-10-CM | POA: Insufficient documentation

## 2016-04-25 DIAGNOSIS — I340 Nonrheumatic mitral (valve) insufficiency: Secondary | ICD-10-CM | POA: Insufficient documentation

## 2016-04-25 DIAGNOSIS — I071 Rheumatic tricuspid insufficiency: Secondary | ICD-10-CM | POA: Insufficient documentation

## 2016-04-25 DIAGNOSIS — I5022 Chronic systolic (congestive) heart failure: Secondary | ICD-10-CM | POA: Insufficient documentation

## 2016-04-25 DIAGNOSIS — I110 Hypertensive heart disease with heart failure: Secondary | ICD-10-CM | POA: Insufficient documentation

## 2016-04-29 ENCOUNTER — Telehealth (HOSPITAL_COMMUNITY): Payer: Self-pay

## 2016-04-29 NOTE — Telephone Encounter (Signed)
ECHOCARDIOGRAM COMPLETE  Order: [id number]  Status:  Final result Visible to patient:  Yes (MyChart) Dx:  Chronic systolic heart failure (HCC)  Notes Recorded by Chyrl Civatte, RN on 04/29/2016 at 10:21 AM EDT Patient aware, reminded up next weeks apt ------  Notes Recorded by Graciella Freer, PA-C on 04/28/2016 at 7:22 AM EDT Improved from previous. Will also forward to Dr. Shirlee Latch.  (Had not been seen in > a year, but recently followed up. Originally seen by Dr. Shirlee Latch)   Baldwin Crown" Holland, PA-C 04/28/2016 7:22 AM

## 2016-05-07 ENCOUNTER — Ambulatory Visit (HOSPITAL_COMMUNITY)
Admission: RE | Admit: 2016-05-07 | Discharge: 2016-05-07 | Disposition: A | Discharge status: Home / Self Care | Payer: Self-pay | Source: Ambulatory Visit | Attending: Cardiology | Admitting: Cardiology

## 2016-05-07 VITALS — BP 162/104 | HR 71 | Wt 298.6 lb

## 2016-05-07 DIAGNOSIS — G4733 Obstructive sleep apnea (adult) (pediatric): Secondary | ICD-10-CM | POA: Insufficient documentation

## 2016-05-07 DIAGNOSIS — Z8249 Family history of ischemic heart disease and other diseases of the circulatory system: Secondary | ICD-10-CM | POA: Insufficient documentation

## 2016-05-07 DIAGNOSIS — I158 Other secondary hypertension: Secondary | ICD-10-CM

## 2016-05-07 DIAGNOSIS — N179 Acute kidney failure, unspecified: Secondary | ICD-10-CM | POA: Insufficient documentation

## 2016-05-07 DIAGNOSIS — I130 Hypertensive heart and chronic kidney disease with heart failure and stage 1 through stage 4 chronic kidney disease, or unspecified chronic kidney disease: Secondary | ICD-10-CM | POA: Insufficient documentation

## 2016-05-07 DIAGNOSIS — N182 Chronic kidney disease, stage 2 (mild): Secondary | ICD-10-CM | POA: Insufficient documentation

## 2016-05-07 DIAGNOSIS — Z7982 Long term (current) use of aspirin: Secondary | ICD-10-CM | POA: Insufficient documentation

## 2016-05-07 DIAGNOSIS — I509 Heart failure, unspecified: Secondary | ICD-10-CM

## 2016-05-07 DIAGNOSIS — I5022 Chronic systolic (congestive) heart failure: Secondary | ICD-10-CM | POA: Insufficient documentation

## 2016-05-07 DIAGNOSIS — E1165 Type 2 diabetes mellitus with hyperglycemia: Secondary | ICD-10-CM

## 2016-05-07 DIAGNOSIS — I43 Cardiomyopathy in diseases classified elsewhere: Secondary | ICD-10-CM | POA: Insufficient documentation

## 2016-05-07 DIAGNOSIS — E1122 Type 2 diabetes mellitus with diabetic chronic kidney disease: Secondary | ICD-10-CM | POA: Insufficient documentation

## 2016-05-07 DIAGNOSIS — E669 Obesity, unspecified: Secondary | ICD-10-CM | POA: Insufficient documentation

## 2016-05-07 DIAGNOSIS — Z7984 Long term (current) use of oral hypoglycemic drugs: Secondary | ICD-10-CM | POA: Insufficient documentation

## 2016-05-07 DIAGNOSIS — Z9119 Patient's noncompliance with other medical treatment and regimen: Secondary | ICD-10-CM | POA: Insufficient documentation

## 2016-05-07 DIAGNOSIS — K219 Gastro-esophageal reflux disease without esophagitis: Secondary | ICD-10-CM | POA: Insufficient documentation

## 2016-05-07 DIAGNOSIS — E785 Hyperlipidemia, unspecified: Secondary | ICD-10-CM | POA: Insufficient documentation

## 2016-05-07 LAB — BASIC METABOLIC PANEL
ANION GAP: 5 (ref 5–15)
BUN: 15 mg/dL (ref 6–20)
CHLORIDE: 107 mmol/L (ref 101–111)
CO2: 29 mmol/L (ref 22–32)
Calcium: 8.8 mg/dL — ABNORMAL LOW (ref 8.9–10.3)
Creatinine, Ser: 1.11 mg/dL (ref 0.61–1.24)
GFR calc non Af Amer: >60 mL/min (ref >60)
Glucose, Bld: 110 mg/dL — ABNORMAL HIGH (ref 65–99)
POTASSIUM: 3.8 mmol/L (ref 3.5–5.1)
SODIUM: 141 mmol/L (ref 135–145)

## 2016-05-07 NOTE — Addendum Note (Signed)
Encounter addended by: Marcy Siren, LCSW on: 05/07/2016 12:10 PM<BR>    Actions taken: Sign clinical note

## 2016-05-07 NOTE — Patient Instructions (Signed)
Labs today We will only contact you if something comes back abnormal or we need to make some changes. Otherwise no news is good news!   Your physician recommends that you schedule a follow-up appointment in: 6-8 weeks with Dr McLean   Do the following things EVERYDAY: 1) Weigh yourself in the morning before breakfast. Write it down and keep it in a log. 2) Take your medicines as prescribed 3) Eat low salt foods-Limit salt (sodium) to 2000 mg per day.  4) Stay as active as you can everyday 5) Limit all fluids for the day to less than 2 liters   

## 2016-05-07 NOTE — Progress Notes (Signed)
Advanced Heart Failure Medication Review by a Pharmacist  Does the patient  feel that his/her medications are working for him/her?  yes  Has the patient been experiencing any side effects to the medications prescribed?  no  Does the patient measure his/her own blood pressure or blood glucose at home?  no   Does the patient have any problems obtaining medications due to transportation or finances?   Yes - no insurance, but has new job and will have insurance by end of the year  Understanding of regimen: good Understanding of indications: good Potential of compliance: good Patient understands to avoid NSAIDs. Patient understands to avoid decongestants.  Issues to address at subsequent visits: None   Pharmacist comments:  [facility] is a pleasant 40 yo M presenting without a medication list but with good recall of his regimen. He reports good compliance with his regimen and did not have any specific medication-related questions or concerns for me at this time.   Tyler Deis. Bonnye Fava, PharmD, BCPS, CPP Clinical Pharmacist Pager: [phone] Phone: [phone] 05/07/2016 10:21 AM      Time with patient: 10 minutes Preparation and documentation time: 2 minutes Total time: 12 minutes

## 2016-05-07 NOTE — Progress Notes (Addendum)
Patient ID: [facility], male   DOB: 12/13/1975, 40 y.o.   MRN: [id number]     Advanced Heart Failure Clinic Note   [facility]   HPI: [facility] is a 40 yo male with a history of HTN, HLD, OSA on CPAP, obesity and chronic systolic HF. Admitted to the hospital 1/16-1/20/15 for SOB. Had L/RHC with normal coronaries.   Admitted 6/23-6/25/15 for SOB. He was diuresed and medications restarted which he became hypotensive and were scaled back. He had AKI. Discharge weight was 310 lbs  He returns today for regular follow. BP elevated but no meds this am. Only taking hydralazine BID most days.  Following with [facility].  Sugars averaging around ~100. No smoking. occasional ETOH. Denies DOE on flat ground.  Occasionally SOB on with hurrying (he states jogging/running).  Mild SOB with inclines. No orthopnea, Sleeps on 2 pillows chronically. 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 Says the holidays are a good time for him work and money wise. Trying to watch salt and fluid. Works as Therapist, nutritional. Says his BPs usually average in 120-130s.  RHC/LHC  07/26/13  RA 10 RV 36/10/13 PA 34/16 (23) PCWP 20124 77 Fick CO/CI 6.18/2.4 Normal Cors   ECHO 07/2013: EF 30-35% ECHO 12/28/2013: EF 45-50%, grade 1 DD.  ECHO 6/16: EF 25%, moderate LVH, mild LV dilation, RV mildly dilated with mildly decreased systolic function ECHO 75/88/32: EF 35-40%, Grade 1 DD. Trivial AI, Mild MR  Labs 01/04/14 K 4.2 Creatinine 1.64  Labs 6/16 K 3.6, creatinine 1.4, LDL 116, HDL 22  SH: No job currently. ETOH occasionally. No tobacco abuse or drugs.   FH: Mother living: - HTN, HLD       Father- HTN  ROS: All systems negative except as listed in HPI, PMH and Problem List.  Past Medical History:  Diagnosis Date  . CHF (congestive heart failure) ([place])   . GERD (gastroesophageal reflux disease)   . Headache    "maybe 5 times/yr; nothing regular" (12/06/2014)  . Hyperlipemia     . Hypertension   . OSA (obstructive sleep apnea)    "should wear mask but I don't have one" (12/06/2014)  . Type II diabetes mellitus ([place])     Current Outpatient Prescriptions  Medication Sig Dispense Refill  . aspirin 81 MG EC tablet Take 1 tablet (81 mg total) by mouth daily. 30 tablet 6  . Blood Glucose Monitoring Suppl (TRUE METRIX METER) W/DEVICE KIT 1 each by Does not apply route once as needed. 1 kit 0  . carvedilol (COREG) 12.5 MG tablet Take 1 tablet (12.5 mg total) by mouth 2 (two) times daily with a meal. 60 tablet 6  . Cholecalciferol (VITAMIN D PO) Take 1 tablet by mouth daily.    . furosemide (LASIX) 40 MG tablet Take 1 tablet (40 mg total) by mouth daily. 30 tablet 6  . glucose blood (TRUE METRIX BLOOD GLUCOSE TEST) test strip Used 3 times daily before meals. 100 each 12  . hydrALAZINE (APRESOLINE) 50 MG tablet Take 1.5 tablets (75 mg total) by mouth 3 (three) times daily. 135 tablet 6  . isosorbide mononitrate (IMDUR) 60 MG 24 hr tablet Take 1 tablet (60 mg total) by mouth daily. 30 tablet 6  . losartan (COZAAR) 25 MG tablet Take 1 tablet (25 mg total) by mouth daily. 30 tablet 1  . metFORMIN (GLUCOPHAGE) 500 MG tablet Take 1 tablet (500 mg total) by mouth 2 (two) times daily  with a meal. 60 tablet 5  . potassium chloride (K-DUR) 10 MEQ tablet Take 1 tablet (10 mEq total) by mouth daily. 30 tablet 6  . pravastatin (PRAVACHOL) 80 MG tablet Take 1 tablet (80 mg total) by mouth daily. 30 tablet 6  . spironolactone (ALDACTONE) 25 MG tablet Take 1 tablet (25 mg total) by mouth daily. 30 tablet 6  . TRUEPLUS LANCETS 30G MISC 1 each by Does not apply route every morning. 50 each 11   No current facility-administered medications for this encounter.     Vitals:   05/07/16 0914  BP: (!) 162/104  BP Location: Left Arm  Patient Position: Sitting  Cuff Size: Large  Pulse: 71  SpO2: 94%  Weight: 298 lb 9.6 oz (135.4 kg)   Wt Readings from Last 3 Encounters:  05/07/16 298 lb  9.6 oz (135.4 kg)  04/11/16 296 lb (134.3 kg)  04/08/16 300 lb (136.1 kg)    PHYSICAL EXAM: General:  Well appearing. Obese. Ambulated into clinic without difficulty.  HEENT: normal Neck: supple. JVP does not appear elevated. Carotids 2+ bilaterally; no bruits. No thyromegaly or nodule noted.  Cor: PMI normal. RRR. No M/G/R Lungs: Clear, normal effort Abdomen: obese, soft, NT, ND, no HSM. No bruits or masses. +BS  Extremities: no cyanosis, clubbing, rash. No edema.  Neuro: alert & orientedx3, cranial nerves grossly intact. Moves all 4 extremities w/o difficulty. Affect pleasant.  ASSESSMENT & PLAN:  1) Chronic systolic HF: NICM, EF 35% 6/16 echo improved to 35-40% on repeat.   H  ?Hypertensive cardiomyopathy.   - ECHO 04/25/16: EF 35-40%, Grade 1 DD. Trivial AI, Mild MR - NYHA class II symptoms. - Continue carvedilol 12.5 mg BID - Continue Lasix 40 mg daily.  Can take extra as needed for weight gain.  - Continue losartan 25 mg daily.  BMET today.  Has been non-compliant with Entresto in the past, plus has no proof of income for [facility] Medication assistance.  - Missed meds this morning and states he didn't know he could take his third hydralazine before bed.  - Increase hydralazine to 75 mg TID and imdur 60 mg daily. Cannot afford Bidil.   - Continued to stress importance of medication compliance, daily weights, low sodium diet, and fluid restriction.  - Knows to call the HF clinic if weight increases more than 3 lbs overnight or 5 lbs in a week.  2) HTN:   - Elevated without meds today.  - Should be taking hydralazine TID.  States his BP runs at 120-130 at home when he checks it.  - Asked him to record BPs and call if running > 150  3) OSA:  - Continue nightly CPAP.    4) CKD II - Recheck BMET today.  5)  DMII - Following at [place] has been much improved per patient.  6) Social - HFSW saw today. Pt is starting a job next week and will  have Insurance starting 06/06/16.     BMET today.  EF improved on recent Echo. Again stressed importance of TID hydralazine.   Follow up 6-8 weeks with MD.    Shirley Friar PA-C 05/07/2016

## 2016-05-07 NOTE — Progress Notes (Signed)
CSW referred to follow up with patient regarding need for insurance. Patient reports he will start a new job at [employer] next Monday and should have healthcare by June 06, 2016. Patient reports he currently receives health services at [facility] and obtains his medications there as well. Patient denies any other concerns at this time. CSW available as needed. Lasandra Beech, LCSW [phone]

## 2016-05-09 ENCOUNTER — Encounter (HOSPITAL_COMMUNITY): Payer: Self-pay

## 2016-05-23 MED FILL — hydrALAZINE HCL 50 MG TABS: 50 | 30 days supply | Qty: 135 | Fill #1

## 2016-05-23 MED FILL — ISOSORBIDE MN ER 60 MG TAB: 60 | 30 days supply | Qty: 30 | Fill #6

## 2016-05-23 MED FILL — SPIRONOLACTONE 25 MG TABLET: 25 | 30 days supply | Qty: 30 | Fill #1

## 2016-05-23 MED FILL — ?CARVEDILOL 12.5 MG TABLET: 12.5 | 30 days supply | Qty: 60 | Fill #6

## 2016-05-23 MED FILL — LOSARTAN POTASSIUM 25 MG TA: 25 | 30 days supply | Qty: 30 | Fill #1

## 2016-05-23 MED FILL — POTASSIUM CL 10 MEQ TAB SA: 10 | 30 days supply | Qty: 30 | Fill #0

## 2016-05-27 ENCOUNTER — Telehealth: Payer: Self-pay | Admitting: Family Medicine

## 2016-05-27 ENCOUNTER — Other Ambulatory Visit (HOSPITAL_COMMUNITY): Payer: Self-pay | Admitting: Cardiology

## 2016-05-27 MED ORDER — FUROSEMIDE 40 MG PO TABS: 40.0000 mg | ORAL_TABLET | Freq: Every day | ORAL | 2 refills | Status: DC

## 2016-05-27 MED FILL — FUROSEMIDE 40 MG TABLET: 40 | 30 days supply | Qty: 30 | Fill #0

## 2016-05-27 NOTE — Telephone Encounter (Signed)
Patient called the office to request medication refill for furosemide (LASIX) 40 MG tablet. Please call it in to our pharmacy.   Thank you.

## 2016-05-27 NOTE — Telephone Encounter (Signed)
Requested medication sent to [facility] pharmacy.

## 2016-06-20 ENCOUNTER — Encounter (HOSPITAL_COMMUNITY): Payer: Self-pay

## 2016-06-20 ENCOUNTER — Ambulatory Visit (HOSPITAL_COMMUNITY)
Admission: RE | Admit: 2016-06-20 | Discharge: 2016-06-20 | Disposition: A | Discharge status: Home / Self Care | Payer: BC Managed Care – PPO | Source: Ambulatory Visit | Attending: Cardiology | Admitting: Cardiology

## 2016-06-20 VITALS — BP 142/84 | HR 84 | Wt 307.0 lb

## 2016-06-20 DIAGNOSIS — I428 Other cardiomyopathies: Secondary | ICD-10-CM | POA: Insufficient documentation

## 2016-06-20 DIAGNOSIS — Z79899 Other long term (current) drug therapy: Secondary | ICD-10-CM | POA: Diagnosis not present

## 2016-06-20 DIAGNOSIS — E1122 Type 2 diabetes mellitus with diabetic chronic kidney disease: Secondary | ICD-10-CM | POA: Diagnosis not present

## 2016-06-20 DIAGNOSIS — E785 Hyperlipidemia, unspecified: Secondary | ICD-10-CM | POA: Insufficient documentation

## 2016-06-20 DIAGNOSIS — Z7984 Long term (current) use of oral hypoglycemic drugs: Secondary | ICD-10-CM | POA: Diagnosis not present

## 2016-06-20 DIAGNOSIS — G4733 Obstructive sleep apnea (adult) (pediatric): Secondary | ICD-10-CM | POA: Insufficient documentation

## 2016-06-20 DIAGNOSIS — I5022 Chronic systolic (congestive) heart failure: Secondary | ICD-10-CM | POA: Diagnosis not present

## 2016-06-20 DIAGNOSIS — I130 Hypertensive heart and chronic kidney disease with heart failure and stage 1 through stage 4 chronic kidney disease, or unspecified chronic kidney disease: Secondary | ICD-10-CM | POA: Diagnosis not present

## 2016-06-20 DIAGNOSIS — Z7982 Long term (current) use of aspirin: Secondary | ICD-10-CM | POA: Diagnosis not present

## 2016-06-20 DIAGNOSIS — N182 Chronic kidney disease, stage 2 (mild): Secondary | ICD-10-CM | POA: Diagnosis not present

## 2016-06-20 DIAGNOSIS — E669 Obesity, unspecified: Secondary | ICD-10-CM | POA: Diagnosis not present

## 2016-06-20 LAB — BASIC METABOLIC PANEL
ANION GAP: 9 (ref 5–15)
BUN: 15 mg/dL (ref 6–20)
CHLORIDE: 101 mmol/L (ref 101–111)
CO2: 27 mmol/L (ref 22–32)
Calcium: 9.1 mg/dL (ref 8.9–10.3)
Creatinine, Ser: 1.21 mg/dL (ref 0.61–1.24)
GFR calc non Af Amer: >60 mL/min (ref >60)
Glucose, Bld: 109 mg/dL — ABNORMAL HIGH (ref 65–99)
Potassium: 3.9 mmol/L (ref 3.5–5.1)
SODIUM: 137 mmol/L (ref 135–145)

## 2016-06-20 MED ORDER — SACUBITRIL-VALSARTAN 24-26 MG PO TABS: 1.0000 | ORAL_TABLET | Freq: Two times a day (BID) | ORAL | 3 refills | Status: DC

## 2016-06-20 MED FILL — ENTRESTO 24 MG-26 MG TABLET: 24-26 | 30 days supply | Qty: 60 | Fill #0

## 2016-06-20 NOTE — Patient Instructions (Signed)
Labs today (will call for abnormal results, otherwise no news is good news)  STOP taking Losartan  Start taking Entresto 24mg /26mg  (1 Tab) Two times daily  Labs in 10 days  Follow up in 3 months

## 2016-06-21 NOTE — Progress Notes (Signed)
Patient ID: [facility], male   DOB: July 26, 1975, 40 y.o.   MRN: [id number]     Advanced Heart Failure Clinic Note   [facility]  Cardiology: [facility]  HPI: [facility] is a 40 yo male with a history of HTN, HLD, OSA on CPAP, obesity and chronic systolic HF. Admitted to the hospital 1/16-1/20/15 for SOB. Had L/RHC with normal coronaries.   Admitted 6/23-6/25/15 for SOB. He was diuresed and medications restarted which he became hypotensive and were scaled back. He had AKI. Discharge weight was 310 lbs  He returns today for regular follow. BP remains elevated but not as badly as in the past.  He is taking hydralazine at least twice daily and is fully compliant with other meds.  No exertional dyspnea or chest pain.  No orthopnea/PND.  No lightheadedness/dizziness.  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    RHC/LHC  07/26/13  RA 10 RV 36/10/13 PA 34/16 (23) PCWP 20124 77 Fick CO/CI 6.18/2.4 Normal Cors   ECHO 07/2013: EF 30-35% ECHO 12/28/2013: EF 45-50%, grade 1 DD.  ECHO 6/16: EF 25%, moderate LVH, mild LV dilation, RV mildly dilated with mildly decreased systolic function ECHO 79/39/03: EF 35-40%, Grade 1 DD. Trivial AI, Mild MR  Labs 01/04/14 K 4.2 Creatinine 1.64  Labs 6/16 K 3.6, creatinine 1.4, LDL 116, HDL 22 Labs 11/17 K 3.8, creatinine 1.11  SH: Has band and works at [employer]. ETOH occasionally. No tobacco abuse or drugs.   FH: Mother living: - HTN, HLD       Father- HTN  ROS: All systems negative except as listed in HPI, PMH and Problem List.  Past Medical History:  Diagnosis Date  . CHF (congestive heart failure) ([place])   . GERD (gastroesophageal reflux disease)   . Headache    "maybe 5 times/yr; nothing regular" (12/06/2014)  . Hyperlipemia   . Hypertension   . OSA (obstructive sleep apnea)    "should wear mask but I don't have one" (12/06/2014)  . Type II diabetes mellitus ([place])     Current Outpatient Prescriptions  Medication Sig Dispense Refill    . aspirin 81 MG EC tablet Take 1 tablet (81 mg total) by mouth daily. 30 tablet 6  . Blood Glucose Monitoring Suppl (TRUE METRIX METER) W/DEVICE KIT 1 each by Does not apply route once as needed. 1 kit 0  . carvedilol (COREG) 12.5 MG tablet Take 1 tablet (12.5 mg total) by mouth 2 (two) times daily with a meal. 60 tablet 6  . Cholecalciferol (VITAMIN D PO) Take 1 tablet by mouth daily.    . furosemide (LASIX) 40 MG tablet Take 1 tablet (40 mg total) by mouth daily. 30 tablet 2  . glucose blood (TRUE METRIX BLOOD GLUCOSE TEST) test strip Used 3 times daily before meals. 100 each 12  . hydrALAZINE (APRESOLINE) 50 MG tablet Take 1.5 tablets (75 mg total) by mouth 3 (three) times daily. 135 tablet 6  . isosorbide mononitrate (IMDUR) 60 MG 24 hr tablet Take 1 tablet (60 mg total) by mouth daily. 30 tablet 6  . metFORMIN (GLUCOPHAGE) 500 MG tablet Take 1 tablet (500 mg total) by mouth 2 (two) times daily with a meal. 60 tablet 5  . potassium chloride (K-DUR) 10 MEQ tablet Take 1 tablet (10 mEq total) by mouth daily. 30 tablet 6  . pravastatin (PRAVACHOL) 80 MG tablet Take 1 tablet (80 mg total) by mouth daily. 30 tablet 6  . spironolactone (  ALDACTONE) 25 MG tablet Take 1 tablet (25 mg total) by mouth daily. 30 tablet 6  . TRUEPLUS LANCETS 30G MISC 1 each by Does not apply route every morning. 50 each 11  . sacubitril-valsartan (ENTRESTO) 24-26 MG Take 1 tablet by mouth 2 (two) times daily. 60 tablet 3   No current facility-administered medications for this encounter.     Vitals:   06/20/16 0906  BP: (!) 142/84  Pulse: 84  SpO2: 93%  Weight: (!) 307 lb (139.3 kg)   Wt Readings from Last 3 Encounters:  06/20/16 (!) 307 lb (139.3 kg)  05/07/16 298 lb 9.6 oz (135.4 kg)  04/11/16 296 lb (134.3 kg)    PHYSICAL EXAM: General:  Well appearing. Obese. Ambulated into clinic without difficulty.  HEENT: normal Neck: supple. JVP does not appear elevated. Carotids 2+ bilaterally; no bruits. No  thyromegaly or nodule noted.  Cor: PMI normal. RRR. No M/G/R Lungs: Clear, normal effort Abdomen: obese, soft, NT, ND, no HSM. No bruits or masses. +BS  Extremities: no cyanosis, clubbing, rash. No edema.  Neuro: alert & orientedx3, cranial nerves grossly intact. Moves all 4 extremities w/o difficulty. Affect pleasant.  ASSESSMENT & PLAN:  1) Chronic systolic HF: NICM, EF 67% 6/16 echo improved to 35-40% on repeat in 10/17. ?Hypertensive cardiomyopathy.  NYHA class II symptoms. - Continue carvedilol 12.5 mg BID - Continue Lasix 40 mg daily.  Can take extra as needed for weight gain.  - Stop losartan, start Entresto 24/26 bid. BMET today and in 10 days.   - Continue hydralazine/Imdur.  - Continued to stress importance of medication compliance, daily weights, low sodium diet, and fluid restriction.  2) HTN:  BP improved but still runs mildly high.  Stopping losartan and starting Entresto as above.  3) OSA: Continue nightly CPAP.    4) CKD II: Recheck BMET today.  5)  DMII: Following at [facility].   Follow up 3 months.    Loralie Champagne  06/21/2016

## 2016-06-24 ENCOUNTER — Other Ambulatory Visit (HOSPITAL_COMMUNITY): Payer: Self-pay | Admitting: Cardiology

## 2016-06-24 ENCOUNTER — Other Ambulatory Visit (HOSPITAL_COMMUNITY): Payer: Self-pay | Admitting: Internal Medicine

## 2016-06-24 ENCOUNTER — Telehealth (HOSPITAL_COMMUNITY): Payer: Self-pay | Admitting: Pharmacist

## 2016-06-24 DIAGNOSIS — I10 Essential (primary) hypertension: Secondary | ICD-10-CM

## 2016-06-24 DIAGNOSIS — I509 Heart failure, unspecified: Secondary | ICD-10-CM

## 2016-06-24 DIAGNOSIS — I5022 Chronic systolic (congestive) heart failure: Secondary | ICD-10-CM

## 2016-06-24 MED FILL — FUROSEMIDE 40 MG TABLET: 40 | 30 days supply | Qty: 30 | Fill #1

## 2016-06-24 MED FILL — PRAVASTATIN NA 80 MG TAB: 80 | 30 days supply | Qty: 30 | Fill #5

## 2016-06-24 MED FILL — metFORMIN HCL 500 MG TABS: 500 | 30 days supply | Qty: 120 | Fill #1

## 2016-06-24 NOTE — Telephone Encounter (Signed)
Entresto 24-26 mg BID PA approved by [facility] through 06/24/17.   Tyler Deis. Bonnye Fava, PharmD, BCPS, CPP Clinical Pharmacist Pager: [phone] Phone: [phone] 06/24/2016 11:29 AM

## 2016-06-25 MED FILL — CARVEDILOL 12.5 MG TABLET: 12.5 | 30 days supply | Qty: 60 | Fill #0 | Status: TO

## 2016-06-25 MED FILL — POTASSIUM CL 10 MEQ TAB SA: 10 | 30 days supply | Qty: 30 | Fill #0

## 2016-06-25 MED FILL — ISOSORBIDE MN ER 60 MG TAB: 60 | 30 days supply | Qty: 30 | Fill #0 | Status: TO

## 2016-06-25 MED FILL — ?SPIRONOLACTONE 25 MG TABLE: 25 MG | 30 days supply | Qty: 30 | Fill #0

## 2016-07-01 ENCOUNTER — Ambulatory Visit (HOSPITAL_COMMUNITY)
Admission: RE | Admit: 2016-07-01 | Discharge: 2016-07-01 | Disposition: A | Discharge status: Home / Self Care | Payer: BC Managed Care – PPO | Source: Ambulatory Visit | Attending: Cardiology | Admitting: Cardiology

## 2016-07-01 DIAGNOSIS — I5022 Chronic systolic (congestive) heart failure: Secondary | ICD-10-CM | POA: Diagnosis not present

## 2016-07-01 LAB — BASIC METABOLIC PANEL
Anion gap: 6 (ref 5–15)
BUN: 11 mg/dL (ref 6–20)
CALCIUM: 9 mg/dL (ref 8.9–10.3)
CO2: 27 mmol/L (ref 22–32)
CREATININE: 1.05 mg/dL (ref 0.61–1.24)
Chloride: 106 mmol/L (ref 101–111)
GFR calc non Af Amer: >60 mL/min (ref >60)
GLUCOSE: 84 mg/dL (ref 65–99)
Potassium: 3.6 mmol/L (ref 3.5–5.1)
Sodium: 139 mmol/L (ref 135–145)

## 2016-08-04 ENCOUNTER — Other Ambulatory Visit (HOSPITAL_COMMUNITY): Payer: Self-pay | Admitting: Cardiology

## 2016-08-04 MED FILL — SPIRONOLACTONE 25 MG TABLET: 25 | 30 days supply | Qty: 30 | Fill #1

## 2016-08-04 MED FILL — FUROSEMIDE 40 MG TABLET: 40 | 30 days supply | Qty: 30 | Fill #2

## 2016-08-04 MED FILL — PRAVASTATIN NA 80 MG TAB: 80 | 30 days supply | Qty: 30 | Fill #0

## 2016-08-04 MED FILL — ENTRESTO 24 MG-26 MG TABLET: 24-26 | 30 days supply | Qty: 60 | Fill #1

## 2016-08-04 MED FILL — POTASSIUM CL 10 MEQ TAB SA: 10 | 30 days supply | Qty: 30 | Fill #1

## 2016-08-04 MED FILL — ISOSORBIDE MN ER 60 MG TAB: 60 | 30 days supply | Qty: 30 | Fill #1 | Status: TO

## 2016-08-04 MED FILL — hydrALAZINE HCL 50 MG TABS: 50 | 30 days supply | Qty: 135 | Fill #0 | Status: TO

## 2016-08-26 ENCOUNTER — Emergency Department (HOSPITAL_BASED_OUTPATIENT_CLINIC_OR_DEPARTMENT_OTHER): Payer: BC Managed Care – PPO

## 2016-08-26 ENCOUNTER — Encounter (HOSPITAL_BASED_OUTPATIENT_CLINIC_OR_DEPARTMENT_OTHER): Payer: Self-pay | Admitting: *Deleted

## 2016-08-26 ENCOUNTER — Emergency Department (HOSPITAL_BASED_OUTPATIENT_CLINIC_OR_DEPARTMENT_OTHER)
Admission: EM | Admit: 2016-08-26 | Discharge: 2016-08-26 | Disposition: A | Discharge status: Home / Self Care | Payer: BC Managed Care – PPO | Attending: Emergency Medicine | Admitting: Emergency Medicine

## 2016-08-26 DIAGNOSIS — Z7984 Long term (current) use of oral hypoglycemic drugs: Secondary | ICD-10-CM | POA: Insufficient documentation

## 2016-08-26 DIAGNOSIS — Z7982 Long term (current) use of aspirin: Secondary | ICD-10-CM | POA: Diagnosis not present

## 2016-08-26 DIAGNOSIS — M79674 Pain in right toe(s): Secondary | ICD-10-CM | POA: Diagnosis not present

## 2016-08-26 DIAGNOSIS — I110 Hypertensive heart disease with heart failure: Secondary | ICD-10-CM | POA: Insufficient documentation

## 2016-08-26 DIAGNOSIS — Z79899 Other long term (current) drug therapy: Secondary | ICD-10-CM | POA: Insufficient documentation

## 2016-08-26 DIAGNOSIS — E119 Type 2 diabetes mellitus without complications: Secondary | ICD-10-CM | POA: Insufficient documentation

## 2016-08-26 DIAGNOSIS — I5022 Chronic systolic (congestive) heart failure: Secondary | ICD-10-CM | POA: Diagnosis not present

## 2016-08-26 DIAGNOSIS — M79671 Pain in right foot: Secondary | ICD-10-CM | POA: Diagnosis present

## 2016-08-26 MED ORDER — IBUPROFEN 800 MG PO TABS: 800.0000 mg | ORAL_TABLET | Freq: Three times a day (TID) | ORAL | 0 refills | Status: DC

## 2016-08-26 MED ORDER — KETOROLAC TROMETHAMINE 60 MG/2ML IM SOLN
60.0000 mg | Freq: Once | INTRAMUSCULAR | Status: AC
  Administered 2016-08-26: 60 mg via INTRAMUSCULAR
  Filled 2016-08-26: qty 2

## 2016-08-26 MED ORDER — COLCHICINE 0.6 MG PO TABS: 0.6000 mg | ORAL_TABLET | Freq: Every day | ORAL | 0 refills | Status: DC

## 2016-08-26 MED FILL — IBUPROFEN 800 MG TABLET: 800 | 7 days supply | Qty: 21 | Fill #0

## 2016-08-26 NOTE — Discharge Instructions (Signed)
You may have a gout flare. Take 800 mg of ibuprofen every 8 hours for the next 3 days. Take this medication with food to avoid upset stomach. If this does not improve the pain after three days, begin taking the colchicine. Take 1.2 mg (2 tablets) the first day, then 0.6 mg (1 tablet) daily starting on the second day. Take one pill daily until one day after pain subsides. Follow up with a primary care provider on this matter as soon as possible.  Return to the ED should symptoms worsen.

## 2016-08-26 NOTE — ED Triage Notes (Signed)
Pt reports right foot, great toe pain x yesterday, pt states gout runs in his family and he has never had it before.

## 2016-08-26 NOTE — ED Notes (Signed)
ED Provider at bedside. 

## 2016-08-26 NOTE — ED Provider Notes (Signed)
[facility] DEPT [facility] Provider Note   CSN: [id number] Arrival date & time: 08/26/16  1016     History   Chief Complaint Chief Complaint  Patient presents with  . Foot Pain    HPI Vernon Reed is a 41 y.o. male.  HPI   Vernon Reed is a 41 y.o. male, with a history of CHF, HTN, DM, and GERD, presenting to the ED with Right foot pain that began yesterday. Patient states that he was at work when the joint between his foot and his right toe began to ache. This pain increased and then became accompanied by swelling. Current pain is aching, throbbing, rated 8 out of 10, nonradiating. Patient has not taken any medications for the pain. Denies known injury or history of gout. Denies neuro deficits, fever, or other pain.       Past Medical History:  Diagnosis Date  . CHF (congestive heart failure) ([place])   . GERD (gastroesophageal reflux disease)   . Headache    "maybe 5 times/yr; nothing regular" (12/06/2014)  . Hyperlipemia   . Hypertension   . OSA (obstructive sleep apnea)    "should wear mask but I don't have one" (12/06/2014)  . Type II diabetes mellitus [facility])     Patient Active Problem List   Diagnosis Date Noted  . Type II diabetes mellitus ([place]) 12/11/2014  . Hypoxia 12/06/2014  . OSA (obstructive sleep apnea) 01/10/2014  . Chronic systolic heart failure ([place]) 01/04/2014  . Obesity, morbid ([place]) 12/27/2013  . Tachy-brady syndrome ([place]) 07/26/2013  . Cardiomyopathy ([place]) 07/25/2013  . Heart block 07/25/2013  . Hypertension 07/23/2013    Past Surgical History:  Procedure Laterality Date  . 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 APPENDECTOMY  ~ 1989  . CARDIAC CATHETERIZATION  07/2013  . LEFT AND RIGHT HEART CATHETERIZATION WITH CORONARY ANGIOGRAM N/A 07/26/2013   Procedure: LEFT AND RIGHT HEART CATHETERIZATION WITH CORONARY ANGIOGRAM;  Surgeon: Leonie Man, MD;  Location: [facility] CATH LAB;  Service: Cardiovascular;  Laterality:  N/A;       Home Medications    Prior to Admission medications   Medication Sig Start Date End Date Taking? Authorizing Provider  aspirin 81 MG EC tablet Take 1 tablet (81 mg total) by mouth daily. 07/16/15   Larey Dresser, MD  Blood Glucose Monitoring Suppl (TRUE METRIX METER) W/DEVICE KIT 1 each by Does not apply route once as needed. 12/11/14   Arnoldo Morale, MD  carvedilol (COREG) 12.5 MG tablet TAKE 1 TABLET BY MOUTH 2 TIMES DAILY WITH A MEAL. 06/25/16   Larey Dresser, MD  Cholecalciferol (VITAMIN D PO) Take 1 tablet by mouth daily.    Historical Provider, MD  colchicine 0.6 MG tablet Take 1 tablet (0.6 mg total) by mouth daily. 08/26/16   Shawn C Joy, PA-C  furosemide (LASIX) 40 MG tablet Take 1 tablet (40 mg total) by mouth daily. 05/27/16   Arnoldo Morale, MD  glucose blood (TRUE METRIX BLOOD GLUCOSE TEST) test strip Used 3 times daily before meals. 04/08/16   Arnoldo Morale, MD  hydrALAZINE (APRESOLINE) 50 MG tablet TAKE 1 & 1/2 TABLETS BY MOUTH THREE TIMES DAILY 08/04/16   Larey Dresser, MD  ibuprofen (ADVIL,MOTRIN) 800 MG tablet Take 1 tablet (800 mg total) by mouth 3 (three) times daily. 08/26/16   Shawn C Joy, PA-C  isosorbide mononitrate (IMDUR) 60 MG 24 hr tablet TAKE 1 TABLET BY MOUTH DAILY. 06/25/16  Larey Dresser, MD  metFORMIN (GLUCOPHAGE) 500 MG tablet Take 1 tablet (500 mg total) by mouth 2 (two) times daily with a meal. 04/08/16   Arnoldo Morale, MD  potassium chloride (K-DUR) 10 MEQ tablet Take 1 tablet (10 mEq total) by mouth daily. 07/16/15   Larey Dresser, MD  potassium chloride (K-DUR) 10 MEQ tablet TAKE 1 TABLET BY MOUTH DAILY. 06/25/16   Larey Dresser, MD  pravastatin (PRAVACHOL) 80 MG tablet Take 1 tablet (80 mg total) by mouth daily. 04/08/16   Arnoldo Morale, MD  sacubitril-valsartan (ENTRESTO) 24-26 MG Take 1 tablet by mouth 2 (two) times daily. 06/20/16   Larey Dresser, MD  spironolactone (ALDACTONE) 25 MG tablet Take 1 tablet (25 mg total) by mouth daily. 07/16/15    Larey Dresser, MD  spironolactone (ALDACTONE) 25 MG tablet TAKE 1 TABLET BY MOUTH DAILY. 06/25/16   Larey Dresser, MD  TRUEPLUS LANCETS 30G MISC 1 each by Does not apply route every morning. 12/11/14   Arnoldo Morale, MD    Family History Family History  Problem Relation Age of Onset  . Hypertension Mother   . Hypertension Father   . Sleep apnea Father     Social History Social History  Substance Use Topics  . Smoking status: Never Smoker  . Smokeless tobacco: Never Used  . Alcohol use No     Comment: 12/06/2014 "might have a beer a couple times/month"     Allergies   Patient has no known allergies.   Review of Systems Review of Systems  Constitutional: Negative for fever.  Musculoskeletal: Positive for arthralgias and joint swelling.  Skin: Negative for color change.  Neurological: Negative for weakness and numbness.     Physical Exam Updated Vital Signs BP 144/97 (BP Location: Right Arm)   Pulse 68   Temp 98.6 F (37 C) (Oral)   Resp 16   Ht 6' (1.829 m)   Wt 134.7 kg   SpO2 98%   BMI 40.28 kg/m   Physical Exam  Constitutional: He appears well-developed and well-nourished. No distress.  HENT:  Head: Normocephalic and atraumatic.  Eyes: Conjunctivae are normal.  Neck: Neck supple.  Cardiovascular: Normal rate, regular rhythm and intact distal pulses.   Pulmonary/Chest: Effort normal.  Musculoskeletal: He exhibits tenderness. He exhibits no deformity.  Exquisite tenderness over the right first MT joint. Questionable swelling. Full range of motion intact. No noted increased warmth or erythema. No signs of injury noted to the overlying skin.  Neurological: He is alert.  No sensory deficits noted. Strength in the toes 5 out of 5 bilaterally.  Skin: Skin is warm and dry. Capillary refill takes less than 2 seconds. He is not diaphoretic.  Psychiatric: He has a normal mood and affect. His behavior is normal.  Nursing note and vitals reviewed.    ED  Treatments / Results  Labs (all labs ordered are listed, but only abnormal results are displayed) Labs Reviewed - No data to display  EKG  EKG Interpretation None       Radiology Dg Foot Complete Right  Result Date: 08/26/2016 CLINICAL DATA:  Right great toe pain for 1 day, no known injury EXAM: RIGHT FOOT COMPLETE - 3+ VIEW COMPARISON:  None. FINDINGS: Three views of the right foot submitted. No acute fracture or subluxation. Mild hallux valgus deformity. There is probable benign lytic minimal expansile lesion in distal aspect of fourth metatarsal measures about 1.8 cm in length. 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 IMPRESSION: No acute fracture or subluxation. Mild hallux valgus deformity. 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 No cortical destruction periosteal reaction or aggressive features. Electronically Signed   By: Lahoma Crocker M.D.   On: 08/26/2016 13:58    Procedures Procedures (including critical care time)  Medications Ordered in ED Medications  ketorolac (TORADOL) injection 60 mg (60 mg Intramuscular Given 08/26/16 1349)     Initial Impression / Assessment and Plan / ED Course  I have reviewed the triage vital signs and the nursing notes.  Pertinent labs & imaging results that were available during my care of the patient were reviewed by me and considered in my medical decision making (see chart for details).     Patient presents with right great toe pain beginning yesterday. Symptoms and presentation are consistent with gout arthritis. Doubt septic arthritis. Treatment regimen and PCP follow-up recommended. Resources given. Return precautions discussed.    Shared decision-making regarding arthrocentesis and uric acid blood testing was discussed. Patient declined this testing. He acknowledges the possible risks of declining.  Vitals:   08/26/16 1028 08/26/16 1511  BP: 144/97 152/97  Pulse:  68 66  Resp: 16 16  Temp: 98.6 F (37 C)   TempSrc: Oral   SpO2: 98% 94%  Weight: 134.7 kg   Height: 6' (1.829 m)      Final Clinical Impressions(s) / ED Diagnoses   Final diagnoses:  Pain of toe of right foot    New Prescriptions Discharge Medication List as of 08/26/2016  3:10 PM    START taking these medications   Details  colchicine 0.6 MG tablet Take 1 tablet (0.6 mg total) by mouth daily., Starting Tue 08/26/2016, Print    ibuprofen (ADVIL,MOTRIN) 800 MG tablet Take 1 tablet (800 mg total) by mouth 3 (three) times daily., Starting Tue 08/26/2016, Print         Lorayne Bender, PA-C 08/27/16 1143    Duffy Bruce, MD 08/27/16 1201

## 2016-09-08 MED FILL — CARVEDILOL 12.5 MG TABLET: 12.5 | 30 days supply | Qty: 60 | Fill #1 | Status: TO

## 2016-09-17 ENCOUNTER — Other Ambulatory Visit: Payer: Self-pay | Admitting: Family Medicine

## 2016-09-17 MED FILL — ISOSORBIDE MN ER 60 MG TAB: 60 | 30 days supply | Qty: 30 | Fill #2 | Status: TO

## 2016-09-17 MED FILL — PRAVASTATIN NA 80 MG TAB: 80 | 30 days supply | Qty: 30 | Fill #1

## 2016-09-17 MED FILL — SPIRONOLACTONE 25 MG TABLET: 25 | 30 days supply | Qty: 30 | Fill #2

## 2016-09-17 MED FILL — POTASSIUM CL 10 MEQ TAB SA: 10 | 30 days supply | Qty: 30 | Fill #2

## 2016-09-17 MED FILL — FUROSEMIDE 40 MG TABLET: 40 | 30 days supply | Qty: 30 | Fill #0

## 2016-09-18 ENCOUNTER — Inpatient Hospital Stay (HOSPITAL_COMMUNITY): Admission: RE | Admit: 2016-09-18 | Payer: BC Managed Care – PPO | Source: Ambulatory Visit

## 2016-09-29 ENCOUNTER — Other Ambulatory Visit: Payer: Self-pay | Admitting: Family Medicine

## 2016-09-29 DIAGNOSIS — E1165 Type 2 diabetes mellitus with hyperglycemia: Secondary | ICD-10-CM

## 2016-09-29 MED FILL — metFORMIN HCL 500 MG TABS: 500 | 30 days supply | Qty: 120 | Fill #0

## 2016-09-29 MED FILL — ENTRESTO 24 MG-26 MG TABLET: 24-26 | 30 days supply | Qty: 60 | Fill #2

## 2016-10-13 ENCOUNTER — Ambulatory Visit (HOSPITAL_COMMUNITY)
Admission: RE | Admit: 2016-10-13 | Discharge: 2016-10-13 | Disposition: A | Discharge status: Home / Self Care | Payer: BC Managed Care – PPO | Source: Ambulatory Visit | Attending: Cardiology | Admitting: Cardiology

## 2016-10-13 ENCOUNTER — Encounter (HOSPITAL_COMMUNITY): Payer: Self-pay

## 2016-10-13 VITALS — BP 138/80 | HR 87 | Wt 301.5 lb

## 2016-10-13 DIAGNOSIS — K219 Gastro-esophageal reflux disease without esophagitis: Secondary | ICD-10-CM | POA: Insufficient documentation

## 2016-10-13 DIAGNOSIS — Z79899 Other long term (current) drug therapy: Secondary | ICD-10-CM | POA: Insufficient documentation

## 2016-10-13 DIAGNOSIS — G4733 Obstructive sleep apnea (adult) (pediatric): Secondary | ICD-10-CM | POA: Diagnosis not present

## 2016-10-13 DIAGNOSIS — Z8249 Family history of ischemic heart disease and other diseases of the circulatory system: Secondary | ICD-10-CM | POA: Diagnosis not present

## 2016-10-13 DIAGNOSIS — E1165 Type 2 diabetes mellitus with hyperglycemia: Secondary | ICD-10-CM

## 2016-10-13 DIAGNOSIS — I5022 Chronic systolic (congestive) heart failure: Secondary | ICD-10-CM | POA: Diagnosis not present

## 2016-10-13 DIAGNOSIS — E1122 Type 2 diabetes mellitus with diabetic chronic kidney disease: Secondary | ICD-10-CM | POA: Insufficient documentation

## 2016-10-13 DIAGNOSIS — E785 Hyperlipidemia, unspecified: Secondary | ICD-10-CM | POA: Diagnosis not present

## 2016-10-13 DIAGNOSIS — N182 Chronic kidney disease, stage 2 (mild): Secondary | ICD-10-CM

## 2016-10-13 DIAGNOSIS — Z7984 Long term (current) use of oral hypoglycemic drugs: Secondary | ICD-10-CM | POA: Diagnosis not present

## 2016-10-13 DIAGNOSIS — I158 Other secondary hypertension: Secondary | ICD-10-CM

## 2016-10-13 DIAGNOSIS — I130 Hypertensive heart and chronic kidney disease with heart failure and stage 1 through stage 4 chronic kidney disease, or unspecified chronic kidney disease: Secondary | ICD-10-CM | POA: Insufficient documentation

## 2016-10-13 DIAGNOSIS — Z7982 Long term (current) use of aspirin: Secondary | ICD-10-CM | POA: Insufficient documentation

## 2016-10-13 DIAGNOSIS — Z6841 Body Mass Index (BMI) 40.0 and over, adult: Secondary | ICD-10-CM | POA: Diagnosis not present

## 2016-10-13 LAB — BASIC METABOLIC PANEL
Anion gap: 6 (ref 5–15)
BUN: 12 mg/dL (ref 6–20)
CO2: 28 mmol/L (ref 22–32)
CREATININE: 1.25 mg/dL — AB (ref 0.61–1.24)
Calcium: 8.6 mg/dL — ABNORMAL LOW (ref 8.9–10.3)
Chloride: 104 mmol/L (ref 101–111)
GFR calc Af Amer: >60 mL/min (ref >60)
GLUCOSE: 135 mg/dL — AB (ref 65–99)
Potassium: 4.2 mmol/L (ref 3.5–5.1)
SODIUM: 138 mmol/L (ref 135–145)

## 2016-10-13 MED ORDER — SACUBITRIL-VALSARTAN 49-51 MG PO TABS: 1.0000 | ORAL_TABLET | Freq: Two times a day (BID) | ORAL | 3 refills | Status: DC

## 2016-10-13 NOTE — Patient Instructions (Signed)
Increase Entresto to 49/51 mg Twice daily   Lab today  Labs in 10-14 days  Your physician recommends that you schedule a follow-up appointment in: 3 months

## 2016-10-13 NOTE — Progress Notes (Signed)
Patient ID: Vernon Reed, male   DOB: July 29, 1975, 41 y.o.   MRN: [id number]     Advanced Heart Failure Clinic Note   [facility]  Cardiology: [facility] Reed  HPI: [name] is a 41 yo male with a history of HTN, HLD, OSA on CPAP, obesity and chronic systolic HF. Admitted to [facility] 1[facility]-1[facility]20[facility]15 for SOB. Had L/RHC with normal coronaries.   Admitted 6/23-6/25/15 for SOB. He was diuresed and medications restarted which he became hypotensive and were scaled back. He had AKI. Discharge weight was 310 lbs  He returns today for regular follow up . At last visit started on Entresto, Denies any problems.  Feeling good overall. No DOE on flat ground. Mild dyspnea with stairs or getting in a hurry. No orthopnea/PND. No lightheadedness or dizziness. 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 Wearing CPAP nightly. Taking all medications as directed. Hasn't needed any extra lasix.   Review of systems complete and found to be negative unless listed in HPI.    RHC/LHC  07/26/13  RA 10 RV 36/10/13 PA 34/16 (23) PCWP 20124 77 Fick CO/CI 6.18/2.4 Normal Cors   ECHO 07/2013: EF 30-35% ECHO 12/28/2013: EF 45-50%, grade 1 DD.  ECHO 6/16: EF 25%, moderate LVH, mild LV dilation, RV mildly dilated with mildly decreased systolic function ECHO 85/88/50: EF 35-40%, Grade 1 DD. Trivial AI, Mild MR  Labs 01/04/14 K 4.2 Creatinine 1.64  Labs 6/16 K 3.6, creatinine 1.4, LDL 116, HDL 22 Labs 11/17 K 3.8, creatinine 1.11  SH: Has band and works at [facility]. ETOH occasionally. No tobacco abuse or drugs.   FH: Mother living: - HTN, HLD       Father- HTN  Past Medical History:  Diagnosis Date  . CHF (congestive heart failure) ([place])   . GERD (gastroesophageal reflux disease)   . Headache    "maybe 5 times/yr; nothing regular" (12/06/2014)  . Hyperlipemia   . Hypertension   . OSA (obstructive sleep apnea)    "should wear mask but I don't have one" (12/06/2014)  . Type II diabetes mellitus ([place])      Current Outpatient Prescriptions  Medication Sig Dispense Refill  . aspirin 81 MG EC tablet Take 1 tablet (81 mg total) by mouth daily. 30 tablet 6  . Blood Glucose Monitoring Suppl (TRUE METRIX METER) W/DEVICE KIT 1 each by Does not apply route once as needed. 1 kit 0  . carvedilol (COREG) 12.5 MG tablet TAKE 1 TABLET BY MOUTH 2 TIMES DAILY WITH A MEAL. 60 tablet 6  . Cholecalciferol (VITAMIN D PO) Take 1 tablet by mouth daily.    . colchicine 0.6 MG tablet Take 1 tablet (0.6 mg total) by mouth daily. 14 tablet 0  . furosemide (LASIX) 40 MG tablet TAKE 1 TABLET BY MOUTH DAILY 30 tablet 2  . glucose blood (TRUE METRIX BLOOD GLUCOSE TEST) test strip Used 3 times daily before meals. 100 each 12  . hydrALAZINE (APRESOLINE) 50 MG tablet TAKE 1 & 1/2 TABLETS BY MOUTH THREE TIMES DAILY 135 tablet 6  . ibuprofen (ADVIL,MOTRIN) 800 MG tablet Take 1 tablet (800 mg total) by mouth 3 (three) times daily. 21 tablet 0  . isosorbide mononitrate (IMDUR) 60 MG 24 hr tablet TAKE 1 TABLET BY MOUTH DAILY. 30 tablet 6  . metFORMIN (GLUCOPHAGE) 500 MG tablet TAKE 2 TABLETS BY MOUTH 2 TIMES DAILY WITH A MEAL. 120 tablet 0  . potassium chloride (K-DUR) 10 MEQ tablet TAKE 1 TABLET  BY MOUTH DAILY. 30 tablet 6  . pravastatin (PRAVACHOL) 80 MG tablet Take 1 tablet (80 mg total) by mouth daily. 30 tablet 6  . sacubitril-valsartan (ENTRESTO) 24-26 MG Take 1 tablet by mouth 2 (two) times daily. 60 tablet 3  . spironolactone (ALDACTONE) 25 MG tablet Take 1 tablet (25 mg total) by mouth daily. 30 tablet 6  . TRUEPLUS LANCETS 30G MISC 1 each by Does not apply route every morning. 50 each 11   No current facility-administered medications for this encounter.     Vitals:   10/13/16 1027  BP: 138/80  Pulse: 87  SpO2: 98%  Weight: (!) 301 lb 8 oz (136.8 kg)   Wt Readings from Last 3 Encounters:  10/13/16 (!) 301 lb 8 oz (136.8 kg)  08/26/16 297 lb (134.7 kg)  06/20/16 (!) 307 lb (139.3 kg)    PHYSICAL  EXAM: General: Well appearing Vernon Reed in NAD.  HEENT: Normal Neck: Supple. JVD 5-6. Carotids 2+ bilat; no bruits. No thyromegaly or nodule noted. Cor: PMI nondisplaced. RRR, No M/G/R noted Lungs: CTAB, normal effort. Abdomen: Soft, NT, ND, no HSM. No bruits or masses. +BS  Extremities: No cyanosis, clubbing, rash, R and LLE no edema.  Neuro: Alert & oriented x 3. Cranial nerves grossly intact. Moves all 4 extremities w/o difficulty. Affect pleasant    ASSESSMENT & PLAN:  1) Chronic systolic HF: NICM, EF 44% 6/16 echo improved to 35-40% on repeat in 10/17. ?Hypertensive cardiomyopathy.   - NYHA Class II symptoms.  - Volume status stable on exam. Continue lasix 40 mg daily with extra 40 mg prn for weight gain or edema.  - Continue carvedilol 12.5 mg BID - Increase entresto to 49/51. BMET today and repeat in 10-14 days.  - Continue hydralazine 75 mg TID and Imdur 60 mg daily.  - Reinforced fluid restriction to < 2 L daily, sodium restriction to less than 2000 mg daily, and the importance of daily weights.   2) HTN:   - Improved. Meds as above.  3) OSA:  - Continue nightly CPAP.     4) CKD II: - BMET today.   5)  DMII: - Per Vernon Reed. No change.   6) Morbid Obesity - Encouraged to limit portion size and increase activity as able.   Doing well. Labs and meds as above. Follow up 3 months.   Vernon Reed Vernon Reed  10/13/2016  Greater than 50% of the 25 minute visit was spent in counseling/coordination of care regarding disease state education, medication reconciliation, and fluid/salt restriction.

## 2016-10-23 ENCOUNTER — Inpatient Hospital Stay (HOSPITAL_COMMUNITY): Admission: RE | Admit: 2016-10-23 | Payer: BC Managed Care – PPO | Source: Ambulatory Visit

## 2016-10-27 MED FILL — FUROSEMIDE 40 MG TABLET: 40 | 30 days supply | Qty: 30 | Fill #1

## 2016-10-27 MED FILL — SPIRONOLACTONE 25 MG TABLET: 25 | 30 days supply | Qty: 30 | Fill #3

## 2016-10-27 MED FILL — ISOSORBIDE MN ER 60 MG TAB: 60 | 30 days supply | Qty: 30 | Fill #3 | Status: TO

## 2016-10-27 MED FILL — PRAVASTATIN NA 80 MG TAB: 80 | 30 days supply | Qty: 30 | Fill #2

## 2016-10-27 MED FILL — POTASSIUM CL 10 MEQ TAB SA: 10 | 30 days supply | Qty: 30 | Fill #3

## 2016-10-27 MED FILL — CARVEDILOL 12.5 MG TABLET: 12.5 | 30 days supply | Qty: 60 | Fill #2 | Status: TO

## 2016-10-30 MED FILL — ENTRESTO 49 MG-51 MG TABLET: 49-51 | 30 days supply | Qty: 60 | Fill #0

## 2016-12-08 MED FILL — CARVEDILOL 12.5 MG TABLET: 12.5 | 30 days supply | Qty: 60 | Fill #3 | Status: TO

## 2016-12-08 MED FILL — SPIRONOLACTONE 25 MG TABLET: 25 | 30 days supply | Qty: 30 | Fill #4

## 2016-12-08 MED FILL — ISOSORBIDE MN ER 60 MG TAB: 60 | 30 days supply | Qty: 30 | Fill #4 | Status: TO

## 2016-12-08 MED FILL — FUROSEMIDE 40 MG TABLET: 40 | 30 days supply | Qty: 30 | Fill #2

## 2016-12-08 MED FILL — POTASSIUM CL 10 MEQ TAB SA: 10 | 30 days supply | Qty: 30 | Fill #4

## 2016-12-08 MED FILL — PRAVASTATIN NA 80 MG TAB: 80 | 30 days supply | Qty: 30 | Fill #3

## 2016-12-22 MED FILL — hydrALAZINE HCL 50 MG TABS: 50 | 30 days supply | Qty: 135 | Fill #1 | Status: TO

## 2016-12-22 MED FILL — ENTRESTO 49 MG-51 MG TABLET: 49-51 | 30 days supply | Qty: 60 | Fill #1 | Status: TO

## 2017-01-06 ENCOUNTER — Ambulatory Visit (INDEPENDENT_AMBULATORY_CARE_PROVIDER_SITE_OTHER): Payer: BC Managed Care – PPO | Admitting: Adult Health

## 2017-01-06 ENCOUNTER — Encounter: Payer: Self-pay | Admitting: Adult Health

## 2017-01-06 VITALS — BP 152/80 | HR 59 | Temp 98.1°F | Ht 72.0 in | Wt 310.0 lb

## 2017-01-06 DIAGNOSIS — E1165 Type 2 diabetes mellitus with hyperglycemia: Secondary | ICD-10-CM | POA: Diagnosis not present

## 2017-01-06 DIAGNOSIS — Z7689 Persons encountering health services in other specified circumstances: Secondary | ICD-10-CM | POA: Diagnosis not present

## 2017-01-06 DIAGNOSIS — I158 Other secondary hypertension: Secondary | ICD-10-CM | POA: Diagnosis not present

## 2017-01-06 NOTE — Progress Notes (Signed)
Patient presents to clinic today to establish care. He is a pleasant 41 year old male who  has a past medical history of CHF (congestive heart failure) ([place]); GERD (gastroesophageal reflux disease); Headache; Hyperlipemia; Hypertension; OSA (obstructive sleep apnea); and Type II diabetes mellitus ([place]).  His last physical was " a long time ago"   Acute Concerns: Establish Care  Chronic Issues: DM - Metformin 529m BID - reports that he does not take it on a regular basis.   Lab Results  Component Value Date   HGBA1C 11.0 03/12/2016   Sleep Apnea - wears on occasion.   Obesity - He would like to work on weight loss.   CHF - Is followed by cardiology. Takes Entresto   HTN - Coreg 12.5 mg, Isosorbide 60 mg, Entresto  49-51 mg. He did not take his medications today .   Health Maintenance: Dental -- Does not do routine care  Vision -- Routine  Immunizations -- UTD  Colonoscopy -- Never had  Diet: He does not follow a specific diet  Exercise:  Does not exercise.   Past Medical History:  Diagnosis Date  . CHF (congestive heart failure) ([place]   . GERD (gastroesophageal reflux disease)   . Headache    "maybe 5 times/yr; nothing regular" (12/06/2014)  . Hyperlipemia   . Hypertension   . OSA (obstructive sleep apnea)    "should wear mask but I don't have one" (12/06/2014)  . Type II diabetes mellitus ([place]     Past Surgical History:  Procedure Laterality Date  . 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 APPENDECTOMY  ~ 1989  . CARDIAC CATHETERIZATION  07/2013  . LEFT AND RIGHT HEART CATHETERIZATION WITH CORONARY ANGIOGRAM N/A 07/26/2013   Procedure: LEFT AND RIGHT HEART CATHETERIZATION WITH CORONARY ANGIOGRAM;  Surgeon: DLeonie Man MD;  [facility];  Service: Cardiovascular;  Laterality: N/A;    Current Outpatient Prescriptions on File Prior to Visit  Medication Sig Dispense Refill  . aspirin 81 MG EC tablet Take 1 tablet (81 mg  total) by mouth daily. 30 tablet 6  . Blood Glucose Monitoring Suppl (TRUE METRIX METER) W/DEVICE KIT 1 each by Does not apply route once as needed. 1 kit 0  . carvedilol (COREG) 12.5 MG tablet TAKE 1 TABLET BY MOUTH 2 TIMES DAILY WITH A MEAL. 60 tablet 6  . Cholecalciferol (VITAMIN D PO) Take 1 tablet by mouth daily.    . furosemide (LASIX) 40 MG tablet TAKE 1 TABLET BY MOUTH DAILY 30 tablet 2  . glucose blood (TRUE METRIX BLOOD GLUCOSE TEST) test strip Used 3 times daily before meals. 100 each 12  . hydrALAZINE (APRESOLINE) 50 MG tablet TAKE 1 & 1/2 TABLETS BY MOUTH THREE TIMES DAILY 135 tablet 6  . ibuprofen (ADVIL,MOTRIN) 800 MG tablet Take 1 tablet (800 mg total) by mouth 3 (three) times daily. 21 tablet 0  . isosorbide mononitrate (IMDUR) 60 MG 24 hr tablet TAKE 1 TABLET BY MOUTH DAILY. 30 tablet 6  . metFORMIN (GLUCOPHAGE) 500 MG tablet TAKE 2 TABLETS BY MOUTH 2 TIMES DAILY WITH A MEAL. 120 tablet 0  . potassium chloride (K-DUR) 10 MEQ tablet TAKE 1 TABLET BY MOUTH DAILY. 30 tablet 6  . pravastatin (PRAVACHOL) 80 MG tablet Take 1 tablet (80 mg total) by mouth daily. 30 tablet 6  . sacubitril-valsartan (ENTRESTO) 49-51 MG Take 1 tablet by mouth 2 (two) times daily. 60 tablet  3  . spironolactone (ALDACTONE) 25 MG tablet Take 1 tablet (25 mg total) by mouth daily. 30 tablet 6  . TRUEPLUS LANCETS 30G MISC 1 each by Does not apply route every morning. 50 each 11   No current facility-administered medications on file prior to visit.     No Known Allergies  Family History  Problem Relation Age of Onset  . Hypertension Mother   . Hypertension Father   . Sleep apnea Father     Social History   Social History  . Marital status: Divorced    Spouse name: N/A  . Number of children: 3  . Years of education: N/A   Occupational History  . TEFL teacher Dx   Social History Main Topics  . Smoking status: Never Smoker  . Smokeless tobacco: Never Used  . Alcohol use No      Comment: 12/06/2014 "might have a beer a couple times/month"  . Drug use: No  . Sexual activity: Yes   Other Topics Concern  . Not on file   Social History Narrative  . No narrative on file    Review of Systems  Constitutional: Negative.   Eyes: Negative.   Respiratory: Negative.   Cardiovascular: Negative.   Gastrointestinal: Negative.   Genitourinary: Negative.   Musculoskeletal: Negative.   Neurological: Negative.   Psychiatric/Behavioral: Negative.   All other systems reviewed and are negative.    BP (!) 152/80 (BP Location: Left Arm, Patient Position: Sitting, Cuff Size: Normal)   Pulse (!) 59   Temp 98.1 F (36.7 C) (Oral)   Ht 6' (1.829 m)   Wt (!) 310 lb (140.6 kg)   SpO2 96%   BMI 42.04 kg/m   Physical Exam  Constitutional: He is oriented to person, place, and time and well-developed, well-nourished, and in no distress. No distress.  Obese   Cardiovascular: Normal rate, regular rhythm, normal heart sounds and intact distal pulses.  Exam reveals no gallop and no friction rub.   No murmur heard. Pulmonary/Chest: Effort normal and breath sounds normal. No respiratory distress. He has no wheezes. He has no rales. He exhibits no tenderness.  Abdominal: Soft. Bowel sounds are normal. He exhibits no distension and no mass. There is no tenderness. There is no rebound and no guarding.  Neurological: He is alert and oriented to person, place, and time. Gait normal. GCS score is 15.  Skin: Skin is warm and dry. No rash noted. He is not diaphoretic. No erythema. No pallor.  Psychiatric: Mood, memory, affect and judgment normal.  Nursing note and vitals reviewed.   Recent Results (from the past 2160 hour(s))  Basic metabolic panel     Status: Abnormal   Collection Time: 10/13/16 10:57 AM  Result Value Ref Range   Sodium 138 135 - 145 mmol/L   Potassium 4.2 3.5 - 5.1 mmol/L   Chloride 104 101 - 111 mmol/L   CO2 28 22 - 32 mmol/L   Glucose, Bld 135 (H) 65 - 99 mg/dL    BUN 12 6 - 20 mg/dL   Creatinine, Ser 1.25 (H) 0.61 - 1.24 mg/dL   Calcium 8.6 (L) 8.9 - 10.3 mg/dL   GFR calc non Af Amer >60 >60 mL/min   GFR calc Af Amer >60 >60 mL/min    Comment: (NOTE) The eGFR has been calculated using the CKD EPI equation. This calculation has not been validated in all clinical situations. eGFR's persistently <60 mL/min signify possible Chronic Kidney Disease.  Anion gap 6 5 - 15    Assessment/Plan: 1. Encounter to establish care - Follow up for CPE  - Educated on the importance of diet and exercise  -   2. Other secondary hypertension - Educated on the importance of taking medication every day  3. Type 2 diabetes mellitus with hyperglycemia, unspecified whether long term insulin use ([place]) - Needs to take medication every day  - Follow diabetic diet  4. Obesity, morbid ([place]) - I would like him to start working on diet and exercise ( walking or swimming).  - Start tracking food with My Fitness Pal app - Follow up in one month    Dorothyann Peng, NP

## 2017-01-06 NOTE — Patient Instructions (Addendum)
It was great meeting you today   Please follow up in August for your physical   In the meantime, download the [employer] app and start using. Start with swimming and/or walking.

## 2017-01-12 ENCOUNTER — Telehealth (HOSPITAL_COMMUNITY): Payer: Self-pay | Admitting: Vascular Surgery

## 2017-01-12 ENCOUNTER — Other Ambulatory Visit: Payer: Self-pay | Admitting: Family Medicine

## 2017-01-12 ENCOUNTER — Encounter (HOSPITAL_COMMUNITY): Payer: BC Managed Care – PPO

## 2017-01-12 MED FILL — PRAVASTATIN NA 80 MG TAB: 80 | 30 days supply | Qty: 30 | Fill #4 | Status: TO

## 2017-01-12 MED FILL — ?METFORMIN HCL 500MG TABLET: 500 | 30 days supply | Qty: 120 | Fill #2 | Status: TO

## 2017-01-12 MED FILL — SPIRONOLACTONE 25 MG TABLET: 25 | 30 days supply | Qty: 30 | Fill #5 | Status: TO

## 2017-01-12 MED FILL — POTASSIUM CL 10 MEQ TAB SA: 10 | 30 days supply | Qty: 30 | Fill #5 | Status: TO

## 2017-01-12 MED FILL — ISOSORBIDE MN ER 60 MG TAB: 60 | 30 days supply | Qty: 30 | Fill #5 | Status: TO

## 2017-01-12 MED FILL — CARVEDILOL 12.5 MG TABLET: 12.5 | 30 days supply | Qty: 60 | Fill #4 | Status: TO

## 2017-01-12 NOTE — Telephone Encounter (Signed)
PT no showed 7/9 @ 9 appt, left pt message to reschedule missed appt

## 2017-01-15 ENCOUNTER — Other Ambulatory Visit: Payer: Self-pay | Admitting: Family Medicine

## 2017-01-15 ENCOUNTER — Telehealth: Payer: Self-pay | Admitting: Adult Health

## 2017-01-15 MED ORDER — FUROSEMIDE 40 MG PO TABS: 40.0000 mg | ORAL_TABLET | Freq: Every day | ORAL | 3 refills | Status: DC

## 2017-01-15 NOTE — Telephone Encounter (Signed)
Rx done. 

## 2017-01-15 NOTE — Telephone Encounter (Signed)
Ok to refill for one year 90 +3 

## 2017-01-15 NOTE — Telephone Encounter (Signed)
Pt need new Rx for furosemide  Pharm:  [facility] on [address]  Pt is out of Rx.

## 2017-01-21 ENCOUNTER — Ambulatory Visit (HOSPITAL_COMMUNITY)
Admission: RE | Admit: 2017-01-21 | Discharge: 2017-01-21 | Disposition: A | Discharge status: Home / Self Care | Payer: BC Managed Care – PPO | Source: Ambulatory Visit | Attending: Cardiology | Admitting: Cardiology

## 2017-01-21 ENCOUNTER — Encounter (HOSPITAL_COMMUNITY): Payer: Self-pay

## 2017-01-21 VITALS — BP 126/86 | HR 78 | Wt 307.0 lb

## 2017-01-21 DIAGNOSIS — E1122 Type 2 diabetes mellitus with diabetic chronic kidney disease: Secondary | ICD-10-CM | POA: Diagnosis not present

## 2017-01-21 DIAGNOSIS — G4733 Obstructive sleep apnea (adult) (pediatric): Secondary | ICD-10-CM | POA: Diagnosis not present

## 2017-01-21 DIAGNOSIS — I428 Other cardiomyopathies: Secondary | ICD-10-CM | POA: Insufficient documentation

## 2017-01-21 DIAGNOSIS — N182 Chronic kidney disease, stage 2 (mild): Secondary | ICD-10-CM | POA: Diagnosis not present

## 2017-01-21 DIAGNOSIS — I130 Hypertensive heart and chronic kidney disease with heart failure and stage 1 through stage 4 chronic kidney disease, or unspecified chronic kidney disease: Secondary | ICD-10-CM | POA: Insufficient documentation

## 2017-01-21 DIAGNOSIS — Z7984 Long term (current) use of oral hypoglycemic drugs: Secondary | ICD-10-CM | POA: Insufficient documentation

## 2017-01-21 DIAGNOSIS — Z79899 Other long term (current) drug therapy: Secondary | ICD-10-CM | POA: Diagnosis not present

## 2017-01-21 DIAGNOSIS — Z7982 Long term (current) use of aspirin: Secondary | ICD-10-CM | POA: Diagnosis not present

## 2017-01-21 DIAGNOSIS — K219 Gastro-esophageal reflux disease without esophagitis: Secondary | ICD-10-CM | POA: Diagnosis not present

## 2017-01-21 DIAGNOSIS — I5022 Chronic systolic (congestive) heart failure: Secondary | ICD-10-CM

## 2017-01-21 DIAGNOSIS — E785 Hyperlipidemia, unspecified: Secondary | ICD-10-CM | POA: Diagnosis not present

## 2017-01-21 DIAGNOSIS — I10 Essential (primary) hypertension: Secondary | ICD-10-CM

## 2017-01-21 LAB — BASIC METABOLIC PANEL
Anion gap: 6 (ref 5–15)
BUN: 16 mg/dL (ref 6–20)
CALCIUM: 8.9 mg/dL (ref 8.9–10.3)
CO2: 27 mmol/L (ref 22–32)
Chloride: 104 mmol/L (ref 101–111)
Creatinine, Ser: 1.22 mg/dL (ref 0.61–1.24)
GFR calc Af Amer: >60 mL/min (ref >60)
GFR calc non Af Amer: >60 mL/min (ref >60)
GLUCOSE: 137 mg/dL — AB (ref 65–99)
Potassium: 3.9 mmol/L (ref 3.5–5.1)
Sodium: 137 mmol/L (ref 135–145)

## 2017-01-21 MED ORDER — CARVEDILOL 12.5 MG PO TABS: 18.7500 mg | ORAL_TABLET | Freq: Two times a day (BID) | ORAL | 6 refills | Status: DC

## 2017-01-21 NOTE — Progress Notes (Signed)
Patient ID: Vernon Reed, male   DOB: [birth year], 41 y.o.   MRN: [id number]     Advanced Heart Failure Clinic Note   PCP: [facility]  Cardiology: Aundra Dubin  HPI: [name] is a 41 yo male with a history of HTN, HLD, OSA on CPAP, obesity and chronic systolic HF. Admitted to the hospital 1/16-1/20/15 for SOB. Had L/RHC with normal coronaries.   Admitted 6/23-6/25/15 for SOB. He was diuresed and medications restarted which he became hypotensive and were scaled back. He had AKI. Discharge weight was 310 lbs  Today he returns for HF follow up. Last visit entresto was increased to 49-51 mg twice a day. Overall feeling good. Denies SOB/PND/Orthopnea. Weight at home 308-310 pounds. Working out 5 days a week. walking 40 minutes. Taking all medications.     RHC/LHC  07/26/13  RA 10 RV 36/10/13 PA 34/16 (23) PCWP 20124 77 Fick CO/CI 6.18/2.4 Normal Cors   ECHO 07/2013: EF 30-35% ECHO 12/28/2013: EF 45-50%, grade 1 DD.  ECHO 6/16: EF 25%, moderate LVH, mild LV dilation, RV mildly dilated with mildly decreased systolic function ECHO 40/35/24: EF 35-40%, Grade 1 DD. Trivial AI, Mild MR  Labs 01/04/14 K 4.2 Creatinine 1.64  Labs 6/16 K 3.6, creatinine 1.4, LDL 116, HDL 22 Labs 11/17 K 3.8, creatinine 1.11 Labs 10/13/2016: K 4.2 Creatinine 1.25   SH: Has band and works at [employer]. ETOH occasionally. No tobacco abuse or drugs.   FH: Mother living: - HTN, HLD       Father- HTN  Past Medical History:  Diagnosis Date  . CHF (congestive heart failure) ([place])   . Chicken pox   . GERD (gastroesophageal reflux disease)   . Headache    "maybe 5 times/yr; nothing regular" (12/06/2014)  . Hyperlipemia   . Hypertension   . OSA (obstructive sleep apnea)    "should wear mask but I don't have one" (12/06/2014)  . Sleep apnea   . Type II diabetes mellitus ([place])     Current Outpatient Prescriptions  Medication Sig Dispense Refill  . aspirin 81 MG EC tablet Take 1 tablet (81 mg total) by mouth  daily. 30 tablet 6  . Blood Glucose Monitoring Suppl (TRUE METRIX METER) W/DEVICE KIT 1 each by Does not apply route once as needed. 1 kit 0  . carvedilol (COREG) 12.5 MG tablet TAKE 1 TABLET BY MOUTH 2 TIMES DAILY WITH A MEAL. 60 tablet 6  . Cholecalciferol (VITAMIN D PO) Take 1 tablet by mouth daily.    . furosemide (LASIX) 40 MG tablet Take 1 tablet (40 mg total) by mouth daily. 90 tablet 3  . glucose blood (TRUE METRIX BLOOD GLUCOSE TEST) test strip Used 3 times daily before meals. 100 each 12  . hydrALAZINE (APRESOLINE) 50 MG tablet TAKE 1 & 1/2 TABLETS BY MOUTH THREE TIMES DAILY 135 tablet 6  . ibuprofen (ADVIL,MOTRIN) 800 MG tablet Take 1 tablet (800 mg total) by mouth 3 (three) times daily. 21 tablet 0  . isosorbide mononitrate (IMDUR) 60 MG 24 hr tablet TAKE 1 TABLET BY MOUTH DAILY. 30 tablet 6  . metFORMIN (GLUCOPHAGE) 500 MG tablet TAKE 2 TABLETS BY MOUTH 2 TIMES DAILY WITH A MEAL. 120 tablet 0  . potassium chloride (K-DUR) 10 MEQ tablet TAKE 1 TABLET BY MOUTH DAILY. 30 tablet 6  . pravastatin (PRAVACHOL) 80 MG tablet Take 1 tablet (80 mg total) by mouth daily. 30 tablet 6  . sacubitril-valsartan (ENTRESTO) 49-51 MG Take 1 tablet by mouth  2 (two) times daily. 60 tablet 3  . spironolactone (ALDACTONE) 25 MG tablet Take 1 tablet (25 mg total) by mouth daily. 30 tablet 6  . TRUEPLUS LANCETS 30G MISC 1 each by Does not apply route every morning. 50 each 11   No current facility-administered medications for this encounter.     Vitals:   01/21/17 0937  BP: 126/86  Pulse: 78  SpO2: 95%  Weight: (!) 307 lb (139.3 kg)   Wt Readings from Last 3 Encounters:  01/21/17 (!) 307 lb (139.3 kg)  01/06/17 (!) 310 lb (140.6 kg)  10/13/16 (!) 301 lb 8 oz (136.8 kg)    PHYSICAL EXAM: General:  Well appearing. No resp difficulty HEENT: normal Neck: supple. no JVD. Carotids 2+ bilat; no bruits. No lymphadenopathy or thryomegaly appreciated. Cor: PMI nondisplaced. Regular rate & rhythm. No  rubs, gallops or murmurs. Lungs: clear Abdomen: soft, nontender, nondistended. No hepatosplenomegaly. No bruits or masses. Good bowel sounds. Extremities: no cyanosis, clubbing, rash, edema Neuro: alert & orientedx3, cranial nerves grossly intact. moves all 4 extremities w/o difficulty. Affect pleasant  ASSESSMENT & PLAN:  1) Chronic systolic HF: NICM, EF 47% 6/16 echo improved to 35-40% on repeat in 10/17. ?Hypertensive cardiomyopathy.   NYHA I. Volume status stable. Continue lasix 40 mg dail.  Increase coreg to 18.75 mg twice  -Continue entresto, hydralazine, imdur at current dose.  Do the following things EVERYDAY: 1) Weigh yourself in the morning before breakfast. Write it down and keep it in a log. 2) Take your medicines as prescribed 3) Eat low salt foods-Limit salt (sodium) to 2000 mg per day.  4) Stay as active as you can everyday 5) Limit all fluids for the day to less than 2 liters . BMEt today.  2) HTN:   Stable continue current regimen. Marland Kitchen  3) OSA:  Continue CPAP nightly     4) CKD II: - BMET today.   5)  DMII: Per PCP 6) Morbid Obesity -Discussed portion control. Also pharmcay discussed eating a small amount with beta blockers.    Follow up in 3 months. [name]  NP-C  01/21/2017

## 2017-01-21 NOTE — Progress Notes (Signed)
Advanced Heart Failure Medication Review by a Pharmacist  Does the patient  feel that his/her medications are working for him/her?  yes  Has the patient been experiencing any side effects to the medications prescribed?  no  Does the patient measure his/her own blood pressure or blood glucose at home?  yes   Does the patient have any problems obtaining medications due to transportation or finances?   no  Understanding of regimen: good Understanding of indications: good Potential of compliance: good Patient understands to avoid NSAIDs. Patient understands to avoid decongestants.  Issues to address at subsequent visits: None   Pharmacist comments: [name] is a pleasant 40 yo M presenting without a medication list but with good recall of his regimen. He reports good compliance with his regimen and did not have any specific medication-related questions or concerns for me at this time.   Vernon Reed. Bonnye Fava, PharmD, BCPS, CPP Clinical Pharmacist Pager: [phone] Phone: [phone] 01/21/2017 9:49 AM      Time with patient: 10 minutes Preparation and documentation time: 2 minutes Total time: 12 minutes

## 2017-01-21 NOTE — Patient Instructions (Signed)
Routine lab work today. Will notify you of abnormal results, otherwise no news is good news!  INCREASE Carvedilol (Coreg) to 18.75 mg (1.5 tabs) twice daily.  Follow up 3 months. We will call you closer to this time, or you may call our office to schedule 1 month before you are due to be seen.  Take all medication as prescribed the day of your appointment. Bring all medications with you to your appointment.  Do the following things EVERYDAY: 1) Weigh yourself in the morning before breakfast. Write it down and keep it in a log. 2) Take your medicines as prescribed 3) Eat low salt foods-Limit salt (sodium) to 2000 mg per day.  4) Stay as active as you can everyday 5) Limit all fluids for the day to less than 2 liters

## 2017-02-05 NOTE — Addendum Note (Signed)
Encounter addended by: Tonye Becket D, NP on: 02/05/2017 10:12 AM<BR>    Actions taken: LOS modified

## 2017-02-10 ENCOUNTER — Ambulatory Visit (INDEPENDENT_AMBULATORY_CARE_PROVIDER_SITE_OTHER): Payer: BC Managed Care – PPO | Admitting: Adult Health

## 2017-02-10 ENCOUNTER — Encounter: Payer: Self-pay | Admitting: Adult Health

## 2017-02-10 VITALS — BP 105/80 | Temp 98.6°F | Ht 71.5 in | Wt 299.0 lb

## 2017-02-10 DIAGNOSIS — I158 Other secondary hypertension: Secondary | ICD-10-CM | POA: Diagnosis not present

## 2017-02-10 DIAGNOSIS — Z0000 Encounter for general adult medical examination without abnormal findings: Secondary | ICD-10-CM

## 2017-02-10 DIAGNOSIS — E1165 Type 2 diabetes mellitus with hyperglycemia: Secondary | ICD-10-CM | POA: Diagnosis not present

## 2017-02-10 DIAGNOSIS — I5022 Chronic systolic (congestive) heart failure: Secondary | ICD-10-CM

## 2017-02-10 LAB — CBC WITH DIFFERENTIAL/PLATELET
BASOS PCT: 0.3 % (ref 0.0–3.0)
Basophils Absolute: 0 10*3/uL (ref 0.0–0.1)
EOS ABS: 0.2 10*3/uL (ref 0.0–0.7)
Eosinophils Relative: 2 % (ref 0.0–5.0)
HCT: 40.1 % (ref 39.0–52.0)
HEMOGLOBIN: 13 g/dL (ref 13.0–17.0)
LYMPHS ABS: 2.1 10*3/uL (ref 0.7–4.0)
Lymphocytes Relative: 25.5 % (ref 12.0–46.0)
MCHC: 32.5 g/dL (ref 30.0–36.0)
MCV: 93.5 fl (ref 78.0–100.0)
MONO ABS: 0.5 10*3/uL (ref 0.1–1.0)
Monocytes Relative: 5.4 % (ref 3.0–12.0)
Neutro Abs: 5.6 10*3/uL (ref 1.4–7.7)
Neutrophils Relative %: 66.8 % (ref 43.0–77.0)
Platelets: 273 10*3/uL (ref 150.0–400.0)
RBC: 4.29 Mil/uL (ref 4.22–5.81)
RDW: 13.8 % (ref 11.5–15.5)
WBC: 8.4 10*3/uL (ref 4.0–10.5)

## 2017-02-10 LAB — BASIC METABOLIC PANEL
BUN: 19 mg/dL (ref 6–23)
CO2: 30 mEq/L (ref 19–32)
CREATININE: 1.31 mg/dL (ref 0.40–1.50)
Calcium: 9 mg/dL (ref 8.4–10.5)
Chloride: 101 mEq/L (ref 96–112)
GFR: 77.1 mL/min (ref >60.00)
GLUCOSE: 107 mg/dL — AB (ref 70–99)
Potassium: 3.9 mEq/L (ref 3.5–5.1)
Sodium: 139 mEq/L (ref 135–145)

## 2017-02-10 LAB — HEPATIC FUNCTION PANEL
ALT: 23 U/L (ref 0–53)
AST: 18 U/L (ref 0–37)
Albumin: 4.3 g/dL (ref 3.5–5.2)
Alkaline Phosphatase: 43 U/L (ref 39–117)
BILIRUBIN TOTAL: 1.2 mg/dL (ref 0.2–1.2)
Bilirubin, Direct: 0.2 mg/dL (ref 0.0–0.3)
Total Protein: 7.1 g/dL (ref 6.0–8.3)

## 2017-02-10 LAB — LIPID PANEL
CHOLESTEROL: 115 mg/dL (ref 0–200)
HDL: 27.6 mg/dL — AB (ref >39.00)
LDL Cholesterol: 64 mg/dL (ref 0–99)
NonHDL: 86.94
Total CHOL/HDL Ratio: 4
Triglycerides: 115 mg/dL (ref 0.0–149.0)
VLDL: 23 mg/dL (ref 0.0–40.0)

## 2017-02-10 LAB — TSH: TSH: 2.89 u[IU]/mL (ref 0.35–4.50)

## 2017-02-10 LAB — HEMOGLOBIN A1C: HEMOGLOBIN A1C: 6.5 % (ref 4.6–6.5)

## 2017-02-10 NOTE — Patient Instructions (Signed)
It was great seeing you today!   Words cannot express how happy I am for you and the hard work you are putting in to be healthier. Keep it up.   I will call you when I get your blood work back

## 2017-02-10 NOTE — Progress Notes (Signed)
Subjective:    Patient ID: Vernon Reed, male    DOB: [birth year], 41 y.o.   MRN: [id number]  HPI  Patient presents for yearly preventative medicine examination. He is a pleasant 41 year old male who  has a past medical history of CHF (congestive heart failure) ([place]); Chicken pox; GERD (gastroesophageal reflux disease); Headache; Hyperlipemia; Hypertension; OSA (obstructive sleep apnea); Sleep apnea; and Type II diabetes mellitus ([place]).  All immunizations and health maintenance protocols were reviewed with the patient and needed orders were placed.  Appropriate screening laboratory values were ordered for the patient including screening of hyperlipidemia, renal function and hepatic function. If indicated by BPH, a PSA was ordered.  Medication reconciliation,  past medical history, social history, problem list and allergies were reviewed in detail with the patient  Goals were established with regard to weight loss, exercise, and  diet in compliance with medications   He does not do routine dental care but does do routine vision care.   CHF - is followed by Cardiology   HTN - is managed by Cardiology   DM- Take metformin 569m BID. .Marland KitchenDenies any issues with hypoglycemia. He reports today that his blood sugars at home have been in the 90's to 130's   Morbid Obesity - he reports that he has been going to the gym five days per week and is eating well. He has been able to lose 11 pounds in the last month   Wt Readings from Last 3 Encounters:  02/10/17 299 lb (135.6 kg)  01/21/17 (!) 307 lb (139.3 kg)  01/06/17 (!) 310 lb (140.6 kg)    Review of Systems  Constitutional: Negative.   HENT: Negative.   Eyes: Negative.   Respiratory: Negative.   Cardiovascular: Negative.   Gastrointestinal: Negative.   Endocrine: Negative.   Genitourinary: Negative.   Musculoskeletal: Negative.   Skin: Negative.   Allergic/Immunologic: Negative.   Neurological: Negative.   Hematological:  Negative.   Psychiatric/Behavioral: Negative.   All other systems reviewed and are negative.  Past Medical History:  Diagnosis Date  . [place]   . Chicken pox   . GERD (gastroesophageal reflux disease)   . Headache    "maybe 5 times/yr; nothing regular" (12/06/2014)  . Hyperlipemia   . Hypertension   . OSA (obstructive sleep apnea)    "should wear mask but I don't have one" (12/06/2014)  . Sleep apnea   . Type II diabetes mellitus ([place]     Social History   Social History  . Marital status: Divorced    Spouse name: N/A  . Number of children: 3  . Years of education: N/A   Occupational History  . 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 Smoking status: Never Smoker  . Smokeless tobacco: Never Used  . Alcohol use No     Comment: 12/06/2014 "might have a beer a couple times/month"  . Drug use: No  . Sexual activity: Yes   Other Topics Concern  . 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 APPENDECTOMY  ~ 1989  . CARDIAC CATHETERIZATION  07/2013  . LEFT AND RIGHT HEART CATHETERIZATION WITH CORONARY ANGIOGRAM N/A 07/26/2013   Procedure: LEFT  AND RIGHT HEART CATHETERIZATION WITH CORONARY ANGIOGRAM;  Surgeon: Leonie Man, MD;  [facility];  Service: Cardiovascular;  Laterality: N/A;    Family History  Problem Relation Age of Onset  . Hypertension Mother   . Hypertension Father   . Sleep apnea Father     No Known Allergies  Current Outpatient Prescriptions on File Prior to Visit  Medication Sig Dispense Refill  . aspirin 81 MG EC tablet Take 1 tablet (81 mg total) by mouth daily. 30 tablet 6  . Blood Glucose Monitoring Suppl (TRUE METRIX METER) W/DEVICE KIT 1 each by Does not apply route once as needed. 1 kit 0  .  carvedilol (COREG) 12.5 MG tablet Take 1.5 tablets (18.75 mg total) by mouth 2 (two) times daily with a meal. 90 tablet 6  . Cholecalciferol (VITAMIN D PO) Take 1,000 Units by mouth daily.     . furosemide (LASIX) 40 MG tablet Take 1 tablet (40 mg total) by mouth daily. 90 tablet 3  . glucose blood (TRUE METRIX BLOOD GLUCOSE TEST) test strip Used 3 times daily before meals. 100 each 12  . hydrALAZINE (APRESOLINE) 50 MG tablet TAKE 1 & 1/2 TABLETS BY MOUTH THREE TIMES DAILY 135 tablet 6  . isosorbide mononitrate (IMDUR) 60 MG 24 hr tablet TAKE 1 TABLET BY MOUTH DAILY. 30 tablet 6  . metFORMIN (GLUCOPHAGE) 500 MG tablet Take 500 mg by mouth 2 (two) times daily with a meal.    . potassium chloride (K-DUR) 10 MEQ tablet TAKE 1 TABLET BY MOUTH DAILY. 30 tablet 6  . pravastatin (PRAVACHOL) 80 MG tablet Take 1 tablet (80 mg total) by mouth daily. 30 tablet 6  . sacubitril-valsartan (ENTRESTO) 49-51 MG Take 1 tablet by mouth 2 (two) times daily. 60 tablet 3  . spironolactone (ALDACTONE) 25 MG tablet Take 1 tablet (25 mg total) by mouth daily. 30 tablet 6  . TRUEPLUS LANCETS 30G MISC 1 each by Does not apply route every morning. 50 each 11   No current facility-administered medications on file prior to visit.     BP 96/60 (BP Location: Left Arm)   Temp 98.6 F (37 C) (Oral)   Ht 5' 11.5" (1.816 m)   Wt 299 lb (135.6 kg)   BMI 41.12 kg/m       Objective:   Physical Exam  Constitutional: He is oriented to person, place, and time. He appears well-developed and well-nourished. No distress.  Morbidly obese  HENT:  Head: Normocephalic and atraumatic.  Right Ear: External ear normal.  Left Ear: External ear normal.  Nose: Nose normal.  Mouth/Throat: Oropharynx is clear and moist. No oropharyngeal exudate.  Eyes: Pupils are equal, round, and reactive to light. Conjunctivae and EOM are normal. Right eye exhibits no discharge. Left eye exhibits no discharge. No scleral icterus.  Neck: Normal range  of motion. Neck supple. No JVD present. No tracheal deviation present. No thyromegaly present.  Cardiovascular: Normal rate, regular rhythm, normal heart sounds and intact distal pulses.  Exam reveals no gallop and no friction rub.   No murmur heard. Pulmonary/Chest: Effort normal and breath sounds normal. No stridor. No respiratory distress. He has no wheezes. He has no rales. He exhibits no tenderness.  Abdominal: Soft. Bowel sounds are normal. He exhibits no distension and no mass. There is no tenderness. There is no rebound and no guarding.  Musculoskeletal: Normal range of motion. He exhibits no edema, tenderness or deformity.  Lymphadenopathy:    He has  no cervical adenopathy.  Neurological: He is alert and oriented to person, place, and time. He has normal reflexes. He displays normal reflexes. No cranial nerve deficit. He exhibits normal muscle tone. Coordination normal.  Skin: Skin is warm and dry. No rash noted. He is not diaphoretic. No erythema. No pallor.  Psychiatric: He has a normal mood and affect. His behavior is normal. Judgment and thought content normal.  Nursing note and vitals reviewed.     Assessment & Plan:  1. Routine general medical examination at a health care facility - Is doing well with weight loss through diet and exercise  - Basic metabolic panel - CBC with Differential/Platelet - Hemoglobin A1c - Hepatic function panel - Lipid panel - TSH  2. Type 2 diabetes mellitus with hyperglycemia, unspecified whether long term insulin use (HCC)  - Basic metabolic panel - CBC with Differential/Platelet - Hemoglobin A1c - Hepatic function panel - Lipid panel - TSH - Consider increasing Metformin   3. Obesity, morbid ([place]) - Doing very well and I am proud of him and his accomplishment so far  - Basic metabolic panel - CBC with Differential/Platelet - Hemoglobin A1c - Hepatic function panel - Lipid panel - TSH  4. Other secondary hypertension - Advised  to monitor BP at work and follow up with Cardiology is BP is consistently below 105/80 or if he is symptomatic  - Basic metabolic panel - CBC with Differential/Platelet - Hemoglobin A1c - Hepatic function panel - Lipid panel - TSH  5. Chronic systolic heart failure ([place]) - Follow cardiology plan of care  Dorothyann Peng, NP

## 2017-03-10 ENCOUNTER — Other Ambulatory Visit: Payer: Self-pay | Admitting: Cardiology

## 2017-03-10 DIAGNOSIS — I509 Heart failure, unspecified: Secondary | ICD-10-CM

## 2017-03-10 DIAGNOSIS — I5022 Chronic systolic (congestive) heart failure: Secondary | ICD-10-CM

## 2017-03-10 DIAGNOSIS — I10 Essential (primary) hypertension: Secondary | ICD-10-CM

## 2017-03-18 ENCOUNTER — Other Ambulatory Visit: Payer: Self-pay | Admitting: Cardiology

## 2017-03-18 DIAGNOSIS — I509 Heart failure, unspecified: Secondary | ICD-10-CM

## 2017-03-27 ENCOUNTER — Encounter: Payer: Self-pay | Admitting: Adult Health

## 2017-04-13 ENCOUNTER — Other Ambulatory Visit: Payer: Self-pay | Admitting: Cardiology

## 2017-04-13 ENCOUNTER — Other Ambulatory Visit: Payer: Self-pay | Admitting: Family Medicine

## 2017-04-29 ENCOUNTER — Other Ambulatory Visit: Payer: Self-pay | Admitting: Family Medicine

## 2017-04-30 ENCOUNTER — Other Ambulatory Visit: Payer: Self-pay | Admitting: Adult Health

## 2017-04-30 MED ORDER — METFORMIN HCL 500 MG PO TABS: 500.0000 mg | ORAL_TABLET | Freq: Two times a day (BID) | ORAL | 0 refills | Status: DC

## 2017-04-30 NOTE — Telephone Encounter (Signed)
Ok to refill for 3 months.  

## 2017-04-30 NOTE — Telephone Encounter (Signed)
Cory, I don't see that you have prescribed this medication in the past.  Please advise.  Thanks!!

## 2017-04-30 NOTE — Telephone Encounter (Signed)
Sent to the pharmacy by e-scribe. 

## 2017-05-01 NOTE — Telephone Encounter (Signed)
REFILLED ON 04/30/2017.  MESSAGE SENT TO THE PHARMACY.  THIS WAS A DUPLICATE REQUEST.

## 2017-05-13 ENCOUNTER — Other Ambulatory Visit: Payer: Self-pay | Admitting: Adult Health

## 2017-05-13 MED ORDER — METFORMIN HCL 500 MG PO TABS: 500.0000 mg | ORAL_TABLET | Freq: Two times a day (BID) | ORAL | 0 refills | Status: DC

## 2017-07-06 ENCOUNTER — Telehealth (HOSPITAL_COMMUNITY): Payer: Self-pay | Admitting: Pharmacist

## 2017-07-06 NOTE — Telephone Encounter (Signed)
Entresto 49-51 mg BID PA approved by [facility] through 07/06/18.   Tyler Deis. Bonnye Fava, PharmD, BCPS, CPP Clinical Pharmacist Pager: [phone] Phone: [phone] 07/06/2017 2:02 PM

## 2017-08-13 ENCOUNTER — Other Ambulatory Visit: Payer: Self-pay | Admitting: Cardiology

## 2017-08-13 ENCOUNTER — Other Ambulatory Visit: Payer: Self-pay | Admitting: Internal Medicine

## 2017-08-13 DIAGNOSIS — I5022 Chronic systolic (congestive) heart failure: Secondary | ICD-10-CM

## 2017-08-13 DIAGNOSIS — I10 Essential (primary) hypertension: Secondary | ICD-10-CM

## 2017-10-09 ENCOUNTER — Ambulatory Visit: Payer: BC Managed Care – PPO | Admitting: Adult Health

## 2017-10-09 ENCOUNTER — Encounter: Payer: Self-pay | Admitting: Adult Health

## 2017-10-09 ENCOUNTER — Other Ambulatory Visit (HOSPITAL_COMMUNITY)
Admission: RE | Admit: 2017-10-09 | Discharge: 2017-10-09 | Disposition: A | Discharge status: Home / Self Care | Payer: BC Managed Care – PPO | Source: Ambulatory Visit | Attending: Adult Health | Admitting: Adult Health

## 2017-10-09 VITALS — BP 132/86 | Temp 98.5°F | Wt 319.0 lb

## 2017-10-09 DIAGNOSIS — Z202 Contact with and (suspected) exposure to infections with a predominantly sexual mode of transmission: Secondary | ICD-10-CM | POA: Diagnosis not present

## 2017-10-09 MED ORDER — METRONIDAZOLE 500 MG PO TABS: 2000.0000 mg | ORAL_TABLET | Freq: Once | ORAL | 0 refills | Status: AC

## 2017-10-09 NOTE — Progress Notes (Signed)
Subjective:    Patient ID: Vernon Reed, male    DOB: 10/27/1975, 42 y.o.   MRN: [id number]  HPI  42 year old male who  has a past medical history of CHF (congestive heart failure) ([place]), Chicken pox, GERD (gastroesophageal reflux disease), Headache, Hyperlipemia, Hypertension, OSA (obstructive sleep apnea), Sleep apnea, and Type II diabetes mellitus ([place]).   Acute visit for concern of STD. Reports having unprotected sex two weeks ago, his partner called and informed him yesterday that she tested positive for trichomonas. He is asymptomatic     Review of Systems See HPI   Past Medical History:  Diagnosis Date  . CHF (congestive heart failure) ([place])   . Chicken pox   . GERD (gastroesophageal reflux disease)   . Headache    "maybe 5 times/yr; nothing regular" (12/06/2014)  . Hyperlipemia   . Hypertension   . OSA (obstructive sleep apnea)    "should wear mask but I don't have one" (12/06/2014)  . Sleep apnea   . Type II diabetes mellitus ([place])     Social History   Socioeconomic History  . Marital status: Divorced    Spouse name: Not on file  . Number of children: 3  . Years of education: Not on file  . Highest education level: Not on file  Occupational History  . Occupation: Herbalist: [employer]  Social Needs  . Financial resource strain: Not on file  . Food insecurity:    Worry: Not on file    Inability: Not on file  . Transportation needs:    Medical: Not on file    Non-medical: Not on file  Tobacco Use  . Smoking status: Never Smoker  . Smokeless tobacco: Never Used  Substance and Sexual Activity  . Alcohol use: No    Comment: 12/06/2014 "might have a beer a couple times/month"  . Drug use: No  . Sexual activity: Yes  Lifestyle  . Physical activity:    Days per week: Not on file    Minutes per session: Not on file  . Stress: Not on file  Relationships  . Social connections:    Talks on phone: Not on file    Gets together: Not on file    Attends religious service: Not on file    Active member of club or organization: Not on file    Attends meetings of clubs or organizations: Not on file    Relationship status: Not on file  . Intimate partner violence:    Fear of current or ex partner: Not on file    Emotionally abused: Not on file    Physically abused: Not on file    Forced sexual activity: Not on file  Other Topics Concern  . 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        Past Surgical History:  Procedure Laterality Date  . 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 APPENDECTOMY  ~ 1989  . CARDIAC CATHETERIZATION  07/2013  . LEFT AND RIGHT HEART CATHETERIZATION WITH CORONARY ANGIOGRAM N/A 07/26/2013   Procedure: LEFT AND RIGHT HEART CATHETERIZATION WITH CORONARY ANGIOGRAM;  Surgeon: Leonie Man, MD;  Location: [facility] CATH LAB;  Service: Cardiovascular;  Laterality: N/A;    Family History  Problem Relation Age of Onset  . Hypertension Mother   . Hypertension Father   . Sleep apnea Father  No Known Allergies  Current Outpatient Medications on File Prior to Visit  Medication Sig Dispense Refill  . aspirin 81 MG EC tablet Take 1 tablet (81 mg total) by mouth daily. 30 tablet 6  . Blood Glucose Monitoring Suppl (TRUE METRIX METER) W/DEVICE KIT 1 each by Does not apply route once as needed. 1 kit 0  . carvedilol (COREG) 12.5 MG tablet Take 1.5 tablets (18.75 mg total) by mouth 2 (two) times daily with a meal. 90 tablet 6  . Cholecalciferol (VITAMIN D PO) Take 1,000 Units by mouth daily.     . furosemide (LASIX) 40 MG tablet Take 1 tablet (40 mg total) by mouth daily. 90 tablet 3  . glucose blood (TRUE METRIX BLOOD GLUCOSE TEST) test strip Used 3 times daily before meals. 100 each 12  . hydrALAZINE (APRESOLINE) 50 MG tablet TAKE 1 & 1/2 TABLETS BY MOUTH THREE TIMES DAILY 135 tablet 6  . KLOR-CON 10 10 MEQ tablet TAKE 1 TABLET  EVERY DAY 30 tablet 3  . sacubitril-valsartan (ENTRESTO) 49-51 MG Take 1 tablet by mouth 2 (two) times daily. Needs office visit for further refills [phone] 60 tablet 0  . spironolactone (ALDACTONE) 25 MG tablet TAKE 1 TABLET BY MOUTH EVERY DAY 30 tablet 3  . TRUEPLUS LANCETS 30G MISC 1 each by Does not apply route every morning. 50 each 11  . isosorbide mononitrate (IMDUR) 60 MG 24 hr tablet Take 1 tablet (60 mg total) by mouth daily. Needs office visit for further refills [phone] (Patient not taking: Reported on 10/09/2017) 30 tablet 0  . metFORMIN (GLUCOPHAGE) 500 MG tablet Take 1 tablet (500 mg total) 2 (two) times daily with a meal by mouth. 60 tablet 0  . pravastatin (PRAVACHOL) 80 MG tablet Take 1 tablet (80 mg total) by mouth daily. (Patient not taking: Reported on 10/09/2017) 30 tablet 6   No current facility-administered medications on file prior to visit.     BP 132/86   Temp 98.5 F (36.9 C) (Oral)   Wt (!) 319 lb (144.7 kg)   BMI 43.87 kg/m       Objective:   Physical Exam  Constitutional: He is oriented to person, place, and time. He appears well-developed and well-nourished. No distress.  Cardiovascular: Normal rate, regular rhythm, normal heart sounds and intact distal pulses. Exam reveals no gallop.  No murmur heard. Pulmonary/Chest: Effort normal and breath sounds normal. No respiratory distress. He has no wheezes. He has no rales. He exhibits no tenderness.  Genitourinary: No penile erythema or penile tenderness. No discharge found.  Neurological: He is alert and oriented to person, place, and time.  Skin: Skin is warm and dry. No rash noted. He is not diaphoretic. No erythema.  Psychiatric: He has a normal mood and affect. His behavior is normal. Judgment and thought content normal.  Nursing note and vitals reviewed.     Assessment & Plan:  1. STD exposure - Does not want HIV testing at this time  - Urine cytology ancillary only - metroNIDAZOLE (FLAGYL)  500 MG tablet; Take 4 tablets (2,000 mg total) by mouth once for 1 dose.  Dispense: 4 tablet; Refill: 0 - Encouraged safe sex practices  - No drinking while taking Flagyl   Dorothyann Peng, NP

## 2017-10-12 ENCOUNTER — Encounter: Payer: Self-pay | Admitting: Adult Health

## 2017-10-12 LAB — URINE CYTOLOGY ANCILLARY ONLY
CHLAMYDIA, DNA PROBE: NEGATIVE
Neisseria Gonorrhea: NEGATIVE
TRICH (WINDOWPATH): NEGATIVE

## 2017-10-16 ENCOUNTER — Encounter (HOSPITAL_COMMUNITY): Payer: Self-pay

## 2017-10-16 ENCOUNTER — Ambulatory Visit (HOSPITAL_COMMUNITY)
Admission: RE | Admit: 2017-10-16 | Discharge: 2017-10-16 | Disposition: A | Discharge status: Home / Self Care | Payer: BC Managed Care – PPO | Source: Ambulatory Visit | Attending: Cardiology | Admitting: Cardiology

## 2017-10-16 ENCOUNTER — Telehealth (HOSPITAL_COMMUNITY): Payer: Self-pay | Admitting: Adult Health

## 2017-10-16 VITALS — BP 160/102 | HR 70 | Wt 322.0 lb

## 2017-10-16 DIAGNOSIS — Z6841 Body Mass Index (BMI) 40.0 and over, adult: Secondary | ICD-10-CM | POA: Diagnosis not present

## 2017-10-16 DIAGNOSIS — N182 Chronic kidney disease, stage 2 (mild): Secondary | ICD-10-CM | POA: Diagnosis not present

## 2017-10-16 DIAGNOSIS — I43 Cardiomyopathy in diseases classified elsewhere: Secondary | ICD-10-CM | POA: Insufficient documentation

## 2017-10-16 DIAGNOSIS — G4733 Obstructive sleep apnea (adult) (pediatric): Secondary | ICD-10-CM | POA: Diagnosis not present

## 2017-10-16 DIAGNOSIS — Z79899 Other long term (current) drug therapy: Secondary | ICD-10-CM | POA: Insufficient documentation

## 2017-10-16 DIAGNOSIS — I10 Essential (primary) hypertension: Secondary | ICD-10-CM | POA: Diagnosis not present

## 2017-10-16 DIAGNOSIS — Z8249 Family history of ischemic heart disease and other diseases of the circulatory system: Secondary | ICD-10-CM | POA: Diagnosis not present

## 2017-10-16 DIAGNOSIS — Z7984 Long term (current) use of oral hypoglycemic drugs: Secondary | ICD-10-CM | POA: Insufficient documentation

## 2017-10-16 DIAGNOSIS — I5022 Chronic systolic (congestive) heart failure: Secondary | ICD-10-CM

## 2017-10-16 DIAGNOSIS — K219 Gastro-esophageal reflux disease without esophagitis: Secondary | ICD-10-CM | POA: Insufficient documentation

## 2017-10-16 DIAGNOSIS — Z7982 Long term (current) use of aspirin: Secondary | ICD-10-CM | POA: Insufficient documentation

## 2017-10-16 DIAGNOSIS — E1122 Type 2 diabetes mellitus with diabetic chronic kidney disease: Secondary | ICD-10-CM | POA: Insufficient documentation

## 2017-10-16 DIAGNOSIS — I130 Hypertensive heart and chronic kidney disease with heart failure and stage 1 through stage 4 chronic kidney disease, or unspecified chronic kidney disease: Secondary | ICD-10-CM | POA: Insufficient documentation

## 2017-10-16 DIAGNOSIS — E785 Hyperlipidemia, unspecified: Secondary | ICD-10-CM | POA: Insufficient documentation

## 2017-10-16 LAB — BASIC METABOLIC PANEL
ANION GAP: 10 (ref 5–15)
BUN: 11 mg/dL (ref 6–20)
CO2: 24 mmol/L (ref 22–32)
Calcium: 8.5 mg/dL — ABNORMAL LOW (ref 8.9–10.3)
Chloride: 104 mmol/L (ref 101–111)
Creatinine, Ser: 1.04 mg/dL (ref 0.61–1.24)
Glucose, Bld: 145 mg/dL — ABNORMAL HIGH (ref 65–99)
POTASSIUM: 3.7 mmol/L (ref 3.5–5.1)
Sodium: 138 mmol/L (ref 135–145)

## 2017-10-16 MED ORDER — ISOSORBIDE MONONITRATE ER 30 MG PO TB24: 30.0000 mg | ORAL_TABLET | Freq: Every day | ORAL | 3 refills | Status: DC

## 2017-10-16 MED ORDER — SACUBITRIL-VALSARTAN 97-103 MG PO TABS: 1.0000 | ORAL_TABLET | Freq: Two times a day (BID) | ORAL | 11 refills | Status: DC

## 2017-10-16 MED ORDER — FUROSEMIDE 40 MG PO TABS: 40.0000 mg | ORAL_TABLET | Freq: Every day | ORAL | 3 refills | Status: DC | PRN

## 2017-10-16 NOTE — Patient Instructions (Signed)
CHANGE Lasix to once daily AS NEEDED for swelling/weight gain.  INCREASE Entresto to 97-103 mg twice daily. New Rx has been sent to pharmacy.  RESTART Imdur 30 mg tablet once daily. New Rx has been sent to pharmacy.  Routine lab work today. Will notify you of abnormal results, otherwise no news is good news!  Will schedule you for an echocardiogram at [facility]. Address: [address] ([address]), [place], [place] [address]  Phone: [phone] Their office will contact you by phone to schedule.  Follow up with Amy Clegg NP-C in 3-4 months.  _________________________________________________________ [place] Code: 1400  Take all medication as prescribed the day of your appointment. Bring all medications with you to your appointment.  Do the following things EVERYDAY: 1) Weigh yourself in the morning before breakfast. Write it down and keep it in a log. 2) Take your medicines as prescribed 3) Eat low salt foods-Limit salt (sodium) to 2000 mg per day.  4) Stay as active as you can everyday 5) Limit all fluids for the day to less than 2 liters

## 2017-10-16 NOTE — Telephone Encounter (Signed)
User: Trina Ao A Date/time: 10/16/17 2:51 PM  Comment: Called pt and lmsg for him to CB to get sch for an echo.Edmonia Caprio  Context:  Outcome: Left Message  Phone number: [phone] Phone Type: Home Phone  Comm. type: Telephone Call type: Outgoing  Contact: Addison Lank Relation to patient: Self

## 2017-10-16 NOTE — Progress Notes (Signed)
Patient ID: Vernon Reed, male   DOB: Jul 25, 1975, 42 y.o.   MRN: [id number]     Advanced Heart Failure Clinic Note   PCP: [facility]  Cardiology: Aundra Dubin  HPI: Mr. Guarino is a 42 yo male with a history of HTN, HLD, OSA on CPAP, obesity and chronic systolic HF.   Admitted 6/23-6/25/15 for SOB. He was diuresed and medications restarted which he became hypotensive and were scaled back. He had AKI. Discharge weight was 310 lbs  He returns for HF follow up. Overall feeling fine. Denies SOB/PND/Orthopnea. No chest pain. Appetite ok. No fever or chills. He is not weighing at home. Not exercising. Not using CPAP.  Taking all medications but has been out statin and imdur.     RHC/LHC  07/26/13  RA 10 RV 36/10/13 PA 34/16 (23) PCWP [id number] 77 Fick CO/CI 6.18/2.4 Normal Cors   ECHO 07/2013: EF 30-35% ECHO 12/28/2013: EF 45-50%, grade 1 DD.  ECHO 6/16: EF 25%, moderate LVH, mild LV dilation, RV mildly dilated with mildly decreased systolic function ECHO 93/71/69: EF 35-40%, Grade 1 DD. Trivial AI, Mild MR  Labs 01/04/14 K 4.2 Creatinine 1.64  Labs 6/16 K 3.6, creatinine 1.4, LDL 116, HDL 22 Labs 11/17 K 3.8, creatinine 1.11 Labs 10/13/2016: K 4.2 Creatinine 1.25  Labs 02/10/2017: K 3.9 Creatinine 1.3   SH: Has band and works at [employer]. ETOH occasionally. No tobacco abuse or drugs.   FH: Mother living: - HTN, HLD       Father- HTN  Past Medical History:  Diagnosis Date  . CHF (congestive heart failure) ([place])   . Chicken pox   . GERD (gastroesophageal reflux disease)   . Headache    "maybe 5 times/yr; nothing regular" (12/06/2014)  . Hyperlipemia   . Hypertension   . OSA (obstructive sleep apnea)    "should wear mask but I don't have one" (12/06/2014)  . Sleep apnea   . Type II diabetes mellitus ([place])     Current Outpatient Medications  Medication Sig Dispense Refill  . aspirin 81 MG EC tablet Take 1 tablet (81 mg total) by mouth daily. 30 tablet 6  . Blood Glucose  Monitoring Suppl (TRUE METRIX METER) W/DEVICE KIT 1 each by Does not apply route once as needed. 1 kit 0  . carvedilol (COREG) 12.5 MG tablet Take 1.5 tablets (18.75 mg total) by mouth 2 (two) times daily with a meal. 90 tablet 6  . Cholecalciferol (VITAMIN D PO) Take 1,000 Units by mouth daily.     . furosemide (LASIX) 40 MG tablet Take 1 tablet (40 mg total) by mouth daily. 90 tablet 3  . glucose blood (TRUE METRIX BLOOD GLUCOSE TEST) test strip Used 3 times daily before meals. 100 each 12  . hydrALAZINE (APRESOLINE) 50 MG tablet TAKE 1 & 1/2 TABLETS BY MOUTH THREE TIMES DAILY 135 tablet 6  . KLOR-CON 10 10 MEQ tablet TAKE 1 TABLET EVERY DAY 30 tablet 3  . metFORMIN (GLUCOPHAGE) 500 MG tablet Take 1 tablet (500 mg total) 2 (two) times daily with a meal by mouth. 60 tablet 0  . sacubitril-valsartan (ENTRESTO) 49-51 MG Take 1 tablet by mouth 2 (two) times daily. Needs office visit for further refills [id number] 60 tablet 0  . spironolactone (ALDACTONE) 25 MG tablet TAKE 1 TABLET BY MOUTH EVERY DAY 30 tablet 3  . TRUEPLUS LANCETS 30G MISC 1 each by Does not apply route every morning. 50 each 11   No current facility-administered medications for  this encounter.     Vitals:   10/16/17 0909  BP: (!) 160/102  Pulse: 70  SpO2: 97%  Weight: (!) 322 lb (146.1 kg)   Wt Readings from Last 3 Encounters:  10/16/17 (!) 322 lb (146.1 kg)  10/09/17 (!) 319 lb (144.7 kg)  02/10/17 299 lb (135.6 kg)    PHYSICAL EXAM: General:  Well appearing. No resp difficulty HEENT: normal Neck: supple. no JVD. Carotids 2+ bilat; no bruits. No lymphadenopathy or thryomegaly appreciated. Cor: PMI nondisplaced. Regular rate & rhythm. No rubs, gallops or murmurs. Lungs: clear Abdomen: obese, soft, nontender, nondistended. No hepatosplenomegaly. No bruits or masses. Good bowel sounds. Extremities: no cyanosis, clubbing, rash, edema Neuro: alert & orientedx3, cranial nerves grossly intact. moves all 4 extremities  w/o difficulty. Affect pleasant  ASSESSMENT & PLAN:  1) Chronic systolic HF: NICM, EF 41% 6/16 echo improved to 35-40% on repeat in 10/17. ?Hypertensive cardiomyopathy.   Repeat ECHO.  NYHA I. Volume status stable. Change lasix to as needed.  Continue coreg to 18.75 mg twice a day -Increase entresto 97-103 twice a day.  - Continue hydralazine 75 mg tid. Restart imdur 30 mg daily.  2) HTN:   Elevated. See above.  3) OSA :  Not using CPAP. Discussed CPAP compliance.    4) CKD II: Check BMET today   5)  DMII: Per PCP 6) Morbid Obesity Body mass index is 44.28 kg/m. Discussed portion control.     Follow up in 3-4 months.     Constantino Starace  NP-C  10/16/2017

## 2017-10-23 ENCOUNTER — Ambulatory Visit (HOSPITAL_COMMUNITY): Payer: BC Managed Care – PPO | Attending: Adult Health

## 2017-10-23 DIAGNOSIS — R0989 Other specified symptoms and signs involving the circulatory and respiratory systems: Secondary | ICD-10-CM

## 2017-10-27 ENCOUNTER — Telehealth (HOSPITAL_COMMUNITY): Payer: Self-pay | Admitting: Adult Health

## 2017-10-28 ENCOUNTER — Encounter (HOSPITAL_COMMUNITY): Payer: Self-pay

## 2017-10-28 NOTE — Addendum Note (Signed)
Encounter addended by: Sherald Hess, NP on: 10/28/2017 11:48 AM  Actions taken: Follow-up modified, LOS modified

## 2017-10-28 NOTE — Progress Notes (Signed)
Patient no showed to echo last week. Reached out to [facility] with [employer] scheduling to call patient to reschedule. Rene Kocher has LVM for patient to return call per her report. Will also try to reach out to patient to call [facility] office to reschedule echo.  Ave Filter, RN

## 2017-10-29 NOTE — Telephone Encounter (Signed)
User: Trina Ao A Date/time: 10/27/17 2:46 PM  Comment: Called pt and lmsg for him to CB to r/s echo that was missed 10/23/17  Context:  Outcome: Left Message  Phone number: [phone] Phone Type: Home Phone  Comm. type: Telephone Call type: Outgoing  Contact: Addison Lank Relation to patient: Self

## 2017-11-03 ENCOUNTER — Other Ambulatory Visit: Payer: Self-pay

## 2017-11-03 ENCOUNTER — Ambulatory Visit (HOSPITAL_COMMUNITY): Payer: BC Managed Care – PPO | Attending: Cardiovascular Disease

## 2017-11-03 DIAGNOSIS — G4733 Obstructive sleep apnea (adult) (pediatric): Secondary | ICD-10-CM | POA: Insufficient documentation

## 2017-11-03 DIAGNOSIS — I110 Hypertensive heart disease with heart failure: Secondary | ICD-10-CM | POA: Insufficient documentation

## 2017-11-03 DIAGNOSIS — E119 Type 2 diabetes mellitus without complications: Secondary | ICD-10-CM | POA: Diagnosis not present

## 2017-11-03 DIAGNOSIS — I5022 Chronic systolic (congestive) heart failure: Secondary | ICD-10-CM | POA: Diagnosis not present

## 2017-11-04 ENCOUNTER — Telehealth (HOSPITAL_COMMUNITY): Payer: Self-pay | Admitting: *Deleted

## 2017-11-04 NOTE — Telephone Encounter (Signed)
Result Notes for ECHOCARDIOGRAM COMPLETE   Notes recorded by Georgina Peer, RN on 11/04/2017 at 9:29 AM EDT Called and spoke with patient, he was pleased to hear the results and will continue current medications. No further questions. ------  Notes recorded by Sherald Hess, NP on 11/04/2017 at 7:12 AM EDT Please call EF improved 45-50%. Continue current medications.

## 2017-12-26 ENCOUNTER — Other Ambulatory Visit: Payer: Self-pay | Admitting: Adult Health

## 2017-12-28 ENCOUNTER — Encounter: Payer: Self-pay | Admitting: Family Medicine

## 2017-12-28 NOTE — Telephone Encounter (Signed)
Sent to the pharmacy by e-scribe for 30 days.  Letter sent to the pharmacy by e-scribe.

## 2017-12-28 NOTE — Telephone Encounter (Signed)
Can have 30 days but needs follow up appointment

## 2017-12-28 NOTE — Telephone Encounter (Signed)
Pt not seen for diabetes check since 02/10/17.  Last A1C 6.5.  Please advise.

## 2018-01-06 ENCOUNTER — Other Ambulatory Visit: Payer: Self-pay | Admitting: Internal Medicine

## 2018-01-06 DIAGNOSIS — I509 Heart failure, unspecified: Secondary | ICD-10-CM

## 2018-01-18 ENCOUNTER — Encounter (HOSPITAL_COMMUNITY): Payer: BC Managed Care – PPO

## 2018-01-25 ENCOUNTER — Encounter (HOSPITAL_COMMUNITY): Payer: BC Managed Care – PPO

## 2018-01-25 ENCOUNTER — Other Ambulatory Visit: Payer: Self-pay | Admitting: Adult Health

## 2018-02-01 ENCOUNTER — Telehealth: Payer: Self-pay | Admitting: Adult Health

## 2018-02-01 NOTE — Telephone Encounter (Signed)
Copied from CRM [id number]. Topic: General - Other >> Feb 01, 2018  9:13 AM Stephannie Li, NT wrote: Reason for CRM: Patient called and would like a new prescription for metronidazole 500 mg sent to [id number] [facility], [place] - [address] [phone] (Phone) [phone] (Fax)         please advise

## 2018-02-02 ENCOUNTER — Ambulatory Visit (HOSPITAL_COMMUNITY)
Admission: RE | Admit: 2018-02-02 | Discharge: 2018-02-02 | Disposition: A | Discharge status: Home / Self Care | Payer: BC Managed Care – PPO | Source: Ambulatory Visit | Attending: Cardiology | Admitting: Cardiology

## 2018-02-02 VITALS — BP 124/68 | HR 80 | Wt 315.8 lb

## 2018-02-02 DIAGNOSIS — G4733 Obstructive sleep apnea (adult) (pediatric): Secondary | ICD-10-CM | POA: Insufficient documentation

## 2018-02-02 DIAGNOSIS — I10 Essential (primary) hypertension: Secondary | ICD-10-CM

## 2018-02-02 DIAGNOSIS — N182 Chronic kidney disease, stage 2 (mild): Secondary | ICD-10-CM | POA: Diagnosis not present

## 2018-02-02 DIAGNOSIS — I5022 Chronic systolic (congestive) heart failure: Secondary | ICD-10-CM

## 2018-02-02 DIAGNOSIS — E785 Hyperlipidemia, unspecified: Secondary | ICD-10-CM | POA: Diagnosis not present

## 2018-02-02 DIAGNOSIS — Z8249 Family history of ischemic heart disease and other diseases of the circulatory system: Secondary | ICD-10-CM | POA: Insufficient documentation

## 2018-02-02 DIAGNOSIS — E1122 Type 2 diabetes mellitus with diabetic chronic kidney disease: Secondary | ICD-10-CM | POA: Diagnosis not present

## 2018-02-02 DIAGNOSIS — Z79899 Other long term (current) drug therapy: Secondary | ICD-10-CM | POA: Diagnosis not present

## 2018-02-02 DIAGNOSIS — Z7984 Long term (current) use of oral hypoglycemic drugs: Secondary | ICD-10-CM | POA: Insufficient documentation

## 2018-02-02 DIAGNOSIS — K219 Gastro-esophageal reflux disease without esophagitis: Secondary | ICD-10-CM | POA: Insufficient documentation

## 2018-02-02 DIAGNOSIS — Z6841 Body Mass Index (BMI) 40.0 and over, adult: Secondary | ICD-10-CM | POA: Diagnosis not present

## 2018-02-02 DIAGNOSIS — Z7982 Long term (current) use of aspirin: Secondary | ICD-10-CM | POA: Insufficient documentation

## 2018-02-02 DIAGNOSIS — I130 Hypertensive heart and chronic kidney disease with heart failure and stage 1 through stage 4 chronic kidney disease, or unspecified chronic kidney disease: Secondary | ICD-10-CM | POA: Diagnosis not present

## 2018-02-02 LAB — BASIC METABOLIC PANEL
Anion gap: 7 (ref 5–15)
BUN: 15 mg/dL (ref 6–20)
CO2: 29 mmol/L (ref 22–32)
CREATININE: 1.26 mg/dL — AB (ref 0.61–1.24)
Calcium: 8.9 mg/dL (ref 8.9–10.3)
Chloride: 103 mmol/L (ref 98–111)
GFR calc non Af Amer: >60 mL/min (ref >60)
Glucose, Bld: 157 mg/dL — ABNORMAL HIGH (ref 70–99)
POTASSIUM: 3.7 mmol/L (ref 3.5–5.1)
SODIUM: 139 mmol/L (ref 135–145)

## 2018-02-02 NOTE — Patient Instructions (Signed)
Labs today We will only contact you if something comes back abnormal or we need to make some changes. Otherwise no news is good news!  Your physician recommends that you schedule a follow-up appointment in: 6 months with Dr McLean   Do the following things EVERYDAY: 1) Weigh yourself in the morning before breakfast. Write it down and keep it in a log. 2) Take your medicines as prescribed 3) Eat low salt foods-Limit salt (sodium) to 2000 mg per day.  4) Stay as active as you can everyday 5) Limit all fluids for the day to less than 2 liters   

## 2018-02-02 NOTE — Telephone Encounter (Signed)
Left a message for a return call.

## 2018-02-02 NOTE — Progress Notes (Signed)
Patient ID: Vernon Reed, male   DOB: 1975-10-25, 42 y.o.   MRN: [id number]     Advanced Heart Failure Clinic Note   [facility]P: [facility]  Cardiology: Aundra Dubin  HPI: Vernon Reed is a 42 yo male with a history of HTN, HLD, OSA on CPAP, obesity and chronic systolic HF.   Admitted 6/23-6/25/15 for SOB. He was diuresed and medications restarted which he became hypotensive and were scaled back. He had AKI. Discharge weight was 310 lbs  Today he returns for HF follow up. Overall feeling fine. Denies SOB/PND/Orthopnea. Appetite ok. No fever or chills. He has not been weighing. He has not been exercising. Taking all medications.     RHC/LHC  07/26/13  RA 10 RV 36/10/13 PA 34/16 (23) PCWP 20124 77 Fick CO/CI 6.18/2.4 Normal Cors   ECHO 07/2013: EF 30-35% ECHO 12/28/2013: EF 45-50%, grade 1 DD.  ECHO 6/16: EF 25%, moderate LVH, mild LV dilation, RV mildly dilated with mildly decreased systolic function ECHO 50/09/38: EF 35-40%, Grade 1 DD. Trivial AI, Mild MR ECHO 10/2017 EF 45-50%   Labs 01/04/14 K 4.2 Creatinine 1.64  Labs 6/16 K 3.6, creatinine 1.4, LDL 116, HDL 22 Labs 11/17 K 3.8, creatinine 1.11 Labs 10/13/2016: K 4.2 Creatinine 1.25  Labs 02/10/2017: K 3.9 Creatinine 1.3   SH: Has band and works at [employer]. ETOH occasionally. No tobacco abuse or drugs.   FH: Mother living: - HTN, HLD       Father- HTN  Past Medical History:  Diagnosis Date  . CHF (congestive heart failure) ([place])   . Chicken pox   . GERD (gastroesophageal reflux disease)   . Headache    "maybe 5 times/yr; nothing regular" (12/06/2014)  . Hyperlipemia   . Hypertension   . OSA (obstructive sleep apnea)    "should wear mask but I don't have one" (12/06/2014)  . Sleep apnea   . Type II diabetes mellitus ([place])     Current Outpatient Medications  Medication Sig Dispense Refill  . aspirin 81 MG EC tablet Take 1 tablet (81 mg total) by mouth daily. 30 tablet 6  . Blood Glucose Monitoring Suppl (TRUE METRIX  METER) W/DEVICE KIT 1 each by Does not apply route once as needed. 1 kit 0  . carvedilol (COREG) 12.5 MG tablet Take 1.5 tablets (18.75 mg total) by mouth 2 (two) times daily with a meal. 90 tablet 6  . Cholecalciferol (VITAMIN D PO) Take 1,000 Units by mouth daily.     . furosemide (LASIX) 40 MG tablet Take 1 tablet (40 mg total) by mouth daily as needed. 90 tablet 3  . glucose blood (TRUE METRIX BLOOD GLUCOSE TEST) test strip Used 3 times daily before meals. 100 each 12  . hydrALAZINE (APRESOLINE) 50 MG tablet TAKE 1 & 1/2 TABLETS BY MOUTH THREE TIMES DAILY 135 tablet 6  . isosorbide mononitrate (IMDUR) 30 MG 24 hr tablet Take 1 tablet (30 mg total) by mouth daily. 90 tablet 3  . KLOR-CON 10 10 MEQ tablet TAKE 1 TABLET EVERY DAY 30 tablet 3  . metFORMIN (GLUCOPHAGE) 500 MG tablet TAKE 1 TABLET (500 MG TOTAL) 2 (TWO) TIMES DAILY WITH A MEAL BY MOUTH. 60 tablet 5  . sacubitril-valsartan (ENTRESTO) 97-103 MG Take 1 tablet by mouth 2 (two) times daily. 60 tablet 11  . spironolactone (ALDACTONE) 25 MG tablet TAKE 1 TABLET BY MOUTH EVERY DAY 30 tablet 3  . TRUEPLUS LANCETS 30G MISC 1 each by Does not apply route every  morning. 50 each 11   No current facility-administered medications for this encounter.     Vitals:   02/02/18 1345  BP: 124/68  Pulse: 80  SpO2: 94%  Weight: (!) 315 lb 12.8 oz (143.2 kg)   Wt Readings from Last 3 Encounters:  02/02/18 (!) 315 lb 12.8 oz (143.2 kg)  10/16/17 (!) 322 lb (146.1 kg)  10/09/17 (!) 319 lb (144.7 kg)    PHYSICAL EXAM: General:  Well appearing. No resp difficulty HEENT: normal Neck: supple. no JVD. Carotids 2+ bilat; no bruits. No lymphadenopathy or thryomegaly appreciated. Cor: PMI nondisplaced. Regular rate & rhythm. No rubs, gallops or murmurs. Lungs: clear Abdomen: soft, nontender, nondistended. No hepatosplenomegaly. No bruits or masses. Good bowel sounds. Extremities: no cyanosis, clubbing, rash, edema Neuro: alert & orientedx3, cranial  nerves grossly intact. moves all 4 extremities w/o difficulty. Affect pleasant   ASSESSMENT & PLAN:  1) Chronic systolic HF: NICM, EF 66% 6/16 ECHO now improved 45-50% 10/2017  NYHA I. Volume status stable.Continue lasix as needed.   -Continue coreg to 18.75 mg twice a day -Continue entresto 97-103 twice a day.  - Continue hydralazine 75 mg tid. Restart imdur 30 mg daily.  2) HTN:   Stable.  3) OSA :  Not using CPAP. Discussed CPAP compliance.    4) CKD II: Check BMET today   5)  DMII: Per PCP 6) Morbid Obesity Body mass index is 43.43 kg/m. Discussed portion control.  Encouraged to start exercising.   Follow up 6 months   Amy Clegg NP-C  4:11 PM       Follow up in 6 months.     Amy Clegg  NP-C  02/02/2018

## 2018-02-03 NOTE — Telephone Encounter (Signed)
Spoke to the pt.  Pt had unprotected sex.  Would like to be treated for STI.  Notified pt he needs an appointment and scheduled.  No further action required.

## 2018-02-09 ENCOUNTER — Ambulatory Visit: Payer: BC Managed Care – PPO | Admitting: Adult Health

## 2018-02-09 DIAGNOSIS — Z0289 Encounter for other administrative examinations: Secondary | ICD-10-CM

## 2018-02-10 ENCOUNTER — Ambulatory Visit (INDEPENDENT_AMBULATORY_CARE_PROVIDER_SITE_OTHER): Payer: BC Managed Care – PPO | Admitting: Adult Health

## 2018-02-10 ENCOUNTER — Other Ambulatory Visit (HOSPITAL_COMMUNITY)
Admission: RE | Admit: 2018-02-10 | Discharge: 2018-02-10 | Disposition: A | Discharge status: Home / Self Care | Payer: BC Managed Care – PPO | Source: Ambulatory Visit | Attending: Adult Health | Admitting: Adult Health

## 2018-02-10 ENCOUNTER — Encounter: Payer: Self-pay | Admitting: Adult Health

## 2018-02-10 VITALS — BP 128/90 | Temp 98.1°F | Wt 317.0 lb

## 2018-02-10 DIAGNOSIS — Z113 Encounter for screening for infections with a predominantly sexual mode of transmission: Secondary | ICD-10-CM | POA: Diagnosis not present

## 2018-02-10 NOTE — Progress Notes (Signed)
Subjective:    Patient ID: Brazos Sandoval, male    DOB: 1976-05-26, 42 y.o.   MRN: [id number]  HPI 42 year old male who  has a past medical history of CHF (congestive heart failure) ([place]), Chicken pox, GERD (gastroesophageal reflux disease), Headache, Hyperlipemia, Hypertension, OSA (obstructive sleep apnea), Sleep apnea, and Type II diabetes mellitus ([place]).  He presents to the office today for concern for STI. He reports two weeks ago had unprotected sex. He had one episode of burning the morning after. Has not had any burning since. Denies any drainage or testicle pain.   He has not had unprotected sex since   Review of Systems See HPI   Past Medical History:  Diagnosis Date  . CHF (congestive heart failure) ([place])   . Chicken pox   . GERD (gastroesophageal reflux disease)   . Headache    "maybe 5 times/yr; nothing regular" (12/06/2014)  . Hyperlipemia   . Hypertension   . OSA (obstructive sleep apnea)    "should wear mask but I don't have one" (12/06/2014)  . Sleep apnea   . Type II diabetes mellitus ([place])     Social History   Socioeconomic History  . Marital status: Divorced    Spouse name: Not on file  . Number of children: 3  . Years of education: Not on file  . Highest education level: Not on file  Occupational History  . Occupation: Herbalist: [employer]  Social Needs  . Financial resource strain: Not on file  . Food insecurity:    Worry: Not on file    Inability: Not on file  . Transportation needs:    Medical: Not on file    Non-medical: Not on file  Tobacco Use  . Smoking status: Never Smoker  . Smokeless tobacco: Never Used  Substance and Sexual Activity  . Alcohol use: No    Comment: 12/06/2014 "might have a beer a couple times/month"  . Drug use: No  . Sexual activity: Yes  Lifestyle  . Physical activity:    Days per week: Not on file    Minutes per session: Not on file  . Stress: Not on file  Relationships  . Social  connections:    Talks on phone: Not on file    Gets together: Not on file    Attends religious service: Not on file    Active member of club or organization: Not on file    Attends meetings of clubs or organizations: Not on file    Relationship status: Not on file  . Intimate partner violence:    Fear of current or ex partner: Not on file    Emotionally abused: Not on file    Physically abused: Not on file    Forced sexual activity: Not on file  Other Topics Concern  . 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        Past Surgical History:  Procedure Laterality Date  . 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 APPENDECTOMY  ~ 1989  . CARDIAC CATHETERIZATION  07/2013  . LEFT AND RIGHT HEART CATHETERIZATION WITH CORONARY ANGIOGRAM N/A 07/26/2013   Procedure: LEFT AND RIGHT HEART CATHETERIZATION WITH CORONARY ANGIOGRAM;  Surgeon: Leonie Man, MD;  Location: [facility] CATH LAB;  Service: Cardiovascular;  Laterality: N/A;    Family History  Problem Relation Age of  Onset  . Hypertension Mother   . Hypertension Father   . Sleep apnea Father     No Known Allergies  Current Outpatient Medications on File Prior to Visit  Medication Sig Dispense Refill  . aspirin 81 MG EC tablet Take 1 tablet (81 mg total) by mouth daily. 30 tablet 6  . Blood Glucose Monitoring Suppl (TRUE METRIX METER) W/DEVICE KIT 1 each by Does not apply route once as needed. 1 kit 0  . carvedilol (COREG) 12.5 MG tablet Take 1.5 tablets (18.75 mg total) by mouth 2 (two) times daily with a meal. 90 tablet 6  . Cholecalciferol (VITAMIN D PO) Take 1,000 Units by mouth daily.     . furosemide (LASIX) 40 MG tablet Take 1 tablet (40 mg total) by mouth daily as needed. 90 tablet 3  . glucose blood (TRUE METRIX BLOOD GLUCOSE TEST) test strip Used 3 times daily before meals. 100 each 12  . hydrALAZINE (APRESOLINE) 50 MG tablet TAKE 1 & 1/2  TABLETS BY MOUTH THREE TIMES DAILY 135 tablet 6  . isosorbide mononitrate (IMDUR) 30 MG 24 hr tablet Take 1 tablet (30 mg total) by mouth daily. 90 tablet 3  . KLOR-CON 10 10 MEQ tablet TAKE 1 TABLET EVERY DAY 30 tablet 3  . metFORMIN (GLUCOPHAGE) 500 MG tablet TAKE 1 TABLET (500 MG TOTAL) 2 (TWO) TIMES DAILY WITH A MEAL BY MOUTH. 60 tablet 5  . sacubitril-valsartan (ENTRESTO) 97-103 MG Take 1 tablet by mouth 2 (two) times daily. 60 tablet 11  . spironolactone (ALDACTONE) 25 MG tablet TAKE 1 TABLET BY MOUTH EVERY DAY 30 tablet 3  . TRUEPLUS LANCETS 30G MISC 1 each by Does not apply route every morning. 50 each 11   No current facility-administered medications on file prior to visit.     BP 128/90   Temp 98.1 F (36.7 C) (Oral)   Wt (!) 317 lb (143.8 kg)   BMI 43.60 kg/m       Objective:   Physical Exam  Constitutional: He is oriented to person, place, and time. He appears well-developed and well-nourished. No distress.  Cardiovascular: Normal rate and regular rhythm.  Pulmonary/Chest: Effort normal and breath sounds normal.  Abdominal: Soft. Bowel sounds are normal.  Genitourinary:  Genitourinary Comments: Refused    Neurological: He is oriented to person, place, and time.  Skin: Skin is warm. He is not diaphoretic.  Psychiatric: He has a normal mood and affect. His behavior is normal. Judgment and thought content normal.  Nursing note and vitals reviewed.     Assessment & Plan:  1. Screening examination for STD (sexually transmitted disease) - Will check for STD prior to treatment at patients wishes.  - Encouraged safe sex practice  - Urine cytology ancillary only - HIV antibody - RPR   Dorothyann Peng, NP

## 2018-02-11 LAB — HIV ANTIBODY (ROUTINE TESTING W REFLEX): HIV: NONREACTIVE

## 2018-02-11 LAB — RPR: RPR: NONREACTIVE

## 2018-02-11 LAB — URINE CYTOLOGY ANCILLARY ONLY
Chlamydia: NEGATIVE
NEISSERIA GONORRHEA: NEGATIVE
Trichomonas: NEGATIVE

## 2018-02-25 ENCOUNTER — Other Ambulatory Visit: Payer: Self-pay | Admitting: Cardiology

## 2018-05-25 ENCOUNTER — Other Ambulatory Visit (HOSPITAL_COMMUNITY): Payer: Self-pay | Admitting: Adult Health

## 2018-06-12 ENCOUNTER — Other Ambulatory Visit: Payer: Self-pay | Admitting: Internal Medicine

## 2018-06-12 DIAGNOSIS — I509 Heart failure, unspecified: Secondary | ICD-10-CM

## 2018-06-16 ENCOUNTER — Telehealth: Payer: Self-pay

## 2018-06-16 NOTE — Telephone Encounter (Signed)
He should continue to take it until his A1c is rechecked. It has been over a year since his last CPE and A1c check

## 2018-06-16 NOTE — Telephone Encounter (Signed)
Cory, please advise what you would like to tell pt.

## 2018-06-16 NOTE — Telephone Encounter (Signed)
Copied from CRM [id number]. Topic: General - Other >> Jun 16, 2018  9:18 AM Tamela Oddi wrote: Reason for CRM: Patient called to speak with a nurse or doctor about the metformin that he is currently taking.  Patient states that the FDA is investigating this medication and concerned that it might cause cancer.  Please advise and call patient back at [phone]

## 2018-06-17 NOTE — Telephone Encounter (Signed)
Pt notified to continue to take metformin as prescribed.

## 2018-07-06 ENCOUNTER — Telehealth (HOSPITAL_COMMUNITY): Payer: Self-pay

## 2018-07-06 NOTE — Telephone Encounter (Signed)
Prior authorization through [facility] company was APPROVED for [place] and will expire on 07/07/2019.

## 2018-07-06 NOTE — Telephone Encounter (Signed)
PA for entresto 49/51mg   initiated for renewal via CMM

## 2018-08-04 ENCOUNTER — Ambulatory Visit: Payer: Self-pay

## 2018-08-04 ENCOUNTER — Ambulatory Visit: Payer: BC Managed Care – PPO | Admitting: Adult Health

## 2018-08-04 ENCOUNTER — Encounter: Payer: Self-pay | Admitting: Adult Health

## 2018-08-04 VITALS — BP 118/92 | Temp 98.7°F | Wt 315.0 lb

## 2018-08-04 DIAGNOSIS — E1165 Type 2 diabetes mellitus with hyperglycemia: Secondary | ICD-10-CM | POA: Diagnosis not present

## 2018-08-04 DIAGNOSIS — I10 Essential (primary) hypertension: Secondary | ICD-10-CM

## 2018-08-04 LAB — POCT GLYCOSYLATED HEMOGLOBIN (HGB A1C): HbA1c, POC (controlled diabetic range): 7.9 % — AB (ref 0.0–7.0)

## 2018-08-04 MED ORDER — METFORMIN HCL 500 MG PO TABS: 500.0000 mg | ORAL_TABLET | Freq: Two times a day (BID) | ORAL | 0 refills | Status: DC

## 2018-08-04 MED FILL — metFORMIN HCL 500 MG TABS: 500 | 30 days supply | Qty: 60 | Fill #0

## 2018-08-04 NOTE — Telephone Encounter (Signed)
Incoming call from Patient with complaint of elevated blood pressures and elevated blood sugars. Patient states he ran out of  Blood Pressure medication until  Monday.   Blood Pressure this am was 153/102.  Blood Sugar  This am was 192 with metformin.  Has a history of High Blood Pressure and  Hypertension.  Patient reports  Frequent urination related to him taking Lasix,  Feel weak.   .   Blood Sugar   was 227 this am.  Scheduled appointment for today, 08/04/18  With  Shirline Frees, NP @ 1:45 pm.  Patient voices understanding and gratitude Provided care advice except for pushing fluids related to fluid restrictions .      Reason for Disposition . [1] Symptoms of high blood sugar (e.g., frequent urination, weak, weight loss) AND [2] not able to test blood glucose  Answer Assessment - Initial Assessment Questions 1. BLOOD PRESSURE: "What is the blood pressure?" "Did you take at least two measurements 5 minutes apart?"     153/102  2. ONSET: "When did you take your blood pressure?"     *No Answer* 3. HOW: "How did you obtain the blood pressure?" (e.g., visiting nurse, automatic home BP monitor)     Automatic machine 4. HISTORY: "Do you have a history of high blood pressure?"     yes 5. MEDICATIONS: "Are you taking any medications for blood pressure?" "Have you missed any doses recently?"     Over the weekend  6. OTHER SYMPTOMS: "Do you have any symptoms?" (e.g., headache, chest pain, blurred vision, difficulty breathing, weakness)     denies 7. PREGNANCY: "Is there any chance you are pregnant?" "When was your last menstrual period?"     na  Answer Assessment - Initial Assessment Questions 1. BLOOD GLUCOSE: "What is your blood glucose level?"     227 2. ONSET: "When did you check the blood glucose?"     This am 3. USUAL RANGE: "What is your glucose level usually?" (e.g., usual fasting morning value, usual evening value)   92 to 97 4. KETONES: "Do you check for ketones (urine or blood test  strips)?" If yes, ask: "What does the test show now?"      no 5. TYPE 1 or 2:  "Do you know what type of diabetes you have?"  (e.g., Type 1, Type 2, Gestational; doesn't know)      Type 2 6. INSULIN: "Do you take insulin?" "What type of insulin(s) do you use? What is the mode of delivery? (syringe, pen (e.g., injection or  pump)?"      no 7. DIABETES PILLS: "Do you take any pills for your diabetes?" If yes, ask: "Have you missed taking any pills recently?"     meftformin 8. OTHER SYMPTOMS: "Do you have any symptoms?" (e.g., fever, frequent urination, difficulty breathing, dizziness, weakness, vomiting)     Weak,  Frequent urination lasix 9. PREGNANCY: "Is there any chance you are pregnant?" "When was your last menstrual period?"     *No Answer*  Protocols used: DIABETES - HIGH BLOOD SUGAR-A-AH, HIGH BLOOD PRESSURE-A-AH

## 2018-08-04 NOTE — Progress Notes (Signed)
Subjective:    Patient ID: Vernon Reed, male    DOB: 1975-12-24, 43 y.o.   MRN: [id number]  HPI  43 year old male who  has a past medical history of CHF (congestive heart failure) ([place]), Chicken pox, GERD (gastroesophageal reflux disease), Headache, Hyperlipemia, Hypertension, OSA (obstructive sleep apnea), Sleep apnea, and Type II diabetes mellitus ([place]).  He presents to the office today for elevated blood sugars and blood pressures. He ran out of his blood pressure medication last week but has since gotten it filled. . When he checked his blood pressure this morning it was 153/102. He denies headaches or blurred vision   When he checked his blood sugar this morning it was 227. He report that he is not taking metformin as directed.. if he takes it is once a day. He also reports that his diet is poor, he is drinking sodas, eating at [facility] daily, and eats a lot of fried chicken   Lab Results  Component Value Date   HGBA1C 6.5 02/10/2017   Lab Results  Component Value Date   HGBA1C 7.9 (A) 08/04/2018    Review of Systems See HPI   Past Medical History:  Diagnosis Date  . CHF (congestive heart failure) ([place])   . Chicken pox   . GERD (gastroesophageal reflux disease)   . Headache    "maybe 5 times/yr; nothing regular" (12/06/2014)  . Hyperlipemia   . Hypertension   . OSA (obstructive sleep apnea)    "should wear mask but I don't have one" (12/06/2014)  . Sleep apnea   . Type II diabetes mellitus ([place])     Social History   Socioeconomic History  . Marital status: Divorced    Spouse name: Not on file  . Number of children: 3  . Years of education: Not on file  . Highest education level: Not on file  Occupational History  . Occupation: Herbalist: [employer]  Social Needs  . Financial resource strain: Not on file  . Food insecurity:    Worry: Not on file    Inability: Not on file  . Transportation needs:    Medical: Not on file    Non-medical:  Not on file  Tobacco Use  . Smoking status: Never Smoker  . Smokeless tobacco: Never Used  Substance and Sexual Activity  . Alcohol use: No    Comment: 12/06/2014 "might have a beer a couple times/month"  . Drug use: No  . Sexual activity: Yes  Lifestyle  . Physical activity:    Days per week: Not on file    Minutes per session: Not on file  . Stress: Not on file  Relationships  . Social connections:    Talks on phone: Not on file    Gets together: Not on file    Attends religious service: Not on file    Active member of club or organization: Not on file    Attends meetings of clubs or organizations: Not on file    Relationship status: Not on file  . Intimate partner violence:    Fear of current or ex partner: Not on file    Emotionally abused: Not on file    Physically abused: Not on file    Forced sexual activity: Not on file  Other Topics Concern  . 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        Past Surgical  History:  Procedure Laterality Date  . 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 APPENDECTOMY  ~ 1989  . CARDIAC CATHETERIZATION  07/2013  . LEFT AND RIGHT HEART CATHETERIZATION WITH CORONARY ANGIOGRAM N/A 07/26/2013   Procedure: LEFT AND RIGHT HEART CATHETERIZATION WITH CORONARY ANGIOGRAM;  Surgeon: Leonie Man, MD;  Location: [facility] CATH LAB;  Service: Cardiovascular;  Laterality: N/A;    Family History  Problem Relation Age of Onset  . Hypertension Mother   . Hypertension Father   . Sleep apnea Father     No Known Allergies  Current Outpatient Medications on File Prior to Visit  Medication Sig Dispense Refill  . aspirin 81 MG EC tablet Take 1 tablet (81 mg total) by mouth daily. 30 tablet 6  . Blood Glucose Monitoring Suppl (TRUE METRIX METER) W/DEVICE KIT 1 each by Does not apply route once as needed. 1 kit 0  . carvedilol (COREG) 12.5 MG tablet TAKE 1 1/2 TABLET BY  MOUTH 2 TIMES DAILY WITH A MEAL 90 tablet 2  . furosemide (LASIX) 40 MG tablet Take 1 tablet (40 mg total) by mouth daily as needed. 90 tablet 3  . glucose blood (TRUE METRIX BLOOD GLUCOSE TEST) test strip Used 3 times daily before meals. 100 each 12  . hydrALAZINE (APRESOLINE) 50 MG tablet TAKE 1&1/2 TABLET 3 TIMES DAILY 135 tablet 5  . isosorbide mononitrate (IMDUR) 30 MG 24 hr tablet Take 1 tablet (30 mg total) by mouth daily. 90 tablet 3  . KLOR-CON 10 10 MEQ tablet TAKE 1 TABLET EVERY DAY 30 tablet 3  . potassium chloride (K-DUR) 10 MEQ tablet TAKE 1 TABLET BY MOUTH EVERY DAY 30 tablet 3  . sacubitril-valsartan (ENTRESTO) 97-103 MG Take 1 tablet by mouth 2 (two) times daily. 60 tablet 11  . spironolactone (ALDACTONE) 25 MG tablet TAKE 1 TABLET BY MOUTH EVERY DAY 30 tablet 3  . spironolactone (ALDACTONE) 25 MG tablet TAKE 1 TABLET BY MOUTH EVERY DAY 30 tablet 3  . TRUEPLUS LANCETS 30G MISC 1 each by Does not apply route every morning. 50 each 11  . Cholecalciferol (VITAMIN D PO) Take 1,000 Units by mouth daily.      No current facility-administered medications on file prior to visit.     BP (!) 118/92   Temp 98.7 F (37.1 C)   Wt (!) 315 lb (142.9 kg)   BMI 43.32 kg/m       Objective:   Physical Exam Vitals signs and nursing note reviewed.  Constitutional:      Appearance: Normal appearance.  Neck:     Musculoskeletal: Normal range of motion and neck supple.  Cardiovascular:     Rate and Rhythm: Normal rate and regular rhythm.     Pulses: Normal pulses.     Heart sounds: Normal heart sounds.  Pulmonary:     Effort: Pulmonary effort is normal.     Breath sounds: Normal breath sounds.  Skin:    General: Skin is warm and dry.     Capillary Refill: Capillary refill takes less than 2 seconds.  Neurological:     General: No focal deficit present.     Mental Status: He is alert and oriented to person, place, and time.       Assessment & Plan:  1. Type 2 diabetes mellitus  with hyperglycemia, unspecified whether long term insulin use ([place]) - Had a long conversation about dietary choices. He needs a total transformation  in lifestyle modification - Advised to take medication as directed. I am not going to change medication at this time - Follow up in 3 months or sooner if needed - POCT A1C- 7.9  - metFORMIN (GLUCOPHAGE) 500 MG tablet; Take 1 tablet (500 mg total) by mouth 2 (two) times daily with a meal.  Dispense: 180 tablet; Refill: 0  2. Essential hypertension - BP back to normal. BP cuff may not be calibrated. Advised to bring cuff to next visit    Dorothyann Peng, NP

## 2018-08-04 NOTE — Telephone Encounter (Signed)
Will send to Nurse as Lorain Childes of appt schedule 08/04/18

## 2018-09-21 ENCOUNTER — Ambulatory Visit (INDEPENDENT_AMBULATORY_CARE_PROVIDER_SITE_OTHER): Payer: BC Managed Care – PPO | Admitting: Adult Health

## 2018-09-21 ENCOUNTER — Encounter: Payer: Self-pay | Admitting: Adult Health

## 2018-09-21 ENCOUNTER — Other Ambulatory Visit: Payer: Self-pay

## 2018-09-21 VITALS — BP 140/90 | Temp 98.5°F | Wt 312.0 lb

## 2018-09-21 DIAGNOSIS — T148XXA Other injury of unspecified body region, initial encounter: Secondary | ICD-10-CM | POA: Diagnosis not present

## 2018-09-21 MED ORDER — CYCLOBENZAPRINE HCL 10 MG PO TABS: 10.0000 mg | ORAL_TABLET | Freq: Three times a day (TID) | ORAL | 0 refills | Status: DC | PRN

## 2018-09-21 NOTE — Progress Notes (Signed)
Subjective:    Patient ID: Vernon Reed, male    DOB: 29-Sep-1975, 43 y.o.   MRN: [id number]  HPI 43 year old male who  has a past medical history of CHF (congestive heart failure) ([place]), Chicken pox, GERD (gastroesophageal reflux disease), Headache, Hyperlipemia, Hypertension, OSA (obstructive sleep apnea), Sleep apnea, and Type II diabetes mellitus ([place]).  He presents to the office today for an acute complaint of left upper back pain x 1-2 weeks. Pain is felt as an "ache". Denies injury but has been working out more. Has no numbness or tingling down his left arm or decrease in grip strength    Review of Systems See HPI   Past Medical History:  Diagnosis Date  . CHF (congestive heart failure) ([place])   . Chicken pox   . GERD (gastroesophageal reflux disease)   . Headache    "maybe 5 times/yr; nothing regular" (12/06/2014)  . Hyperlipemia   . Hypertension   . OSA (obstructive sleep apnea)    "should wear mask but I don't have one" (12/06/2014)  . Sleep apnea   . Type II diabetes mellitus ([place])     Social History   Socioeconomic History  . Marital status: Divorced    Spouse name: Not on file  . Number of children: 3  . Years of education: Not on file  . Highest education level: Not on file  Occupational History  . Occupation: Herbalist: [employer]  Social Needs  . Financial resource strain: Not on file  . Food insecurity:    Worry: Not on file    Inability: Not on file  . Transportation needs:    Medical: Not on file    Non-medical: Not on file  Tobacco Use  . Smoking status: Never Smoker  . Smokeless tobacco: Never Used  Substance and Sexual Activity  . Alcohol use: No    Comment: 12/06/2014 "might have a beer a couple times/month"  . Drug use: No  . Sexual activity: Yes  Lifestyle  . Physical activity:    Days per week: Not on file    Minutes per session: Not on file  . Stress: Not on file  Relationships  . Social connections:    Talks on  phone: Not on file    Gets together: Not on file    Attends religious service: Not on file    Active member of club or organization: Not on file    Attends meetings of clubs or organizations: Not on file    Relationship status: Not on file  . Intimate partner violence:    Fear of current or ex partner: Not on file    Emotionally abused: Not on file    Physically abused: Not on file    Forced sexual activity: Not on file  Other Topics Concern  . 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        Past Surgical History:  Procedure Laterality Date  . 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 APPENDECTOMY  ~ 1989  . CARDIAC CATHETERIZATION  07/2013  . LEFT AND RIGHT HEART CATHETERIZATION WITH CORONARY ANGIOGRAM N/A 07/26/2013   Procedure: LEFT AND RIGHT HEART CATHETERIZATION WITH CORONARY ANGIOGRAM;  Surgeon: Leonie Man, MD;  Location: [name] CATH LAB;  Service: Cardiovascular;  Laterality: N/A;    Family History  Problem Relation Age of Onset  .  Hypertension Mother   . Hypertension Father   . Sleep apnea Father     No Known Allergies  Current Outpatient Medications on File Prior to Visit  Medication Sig Dispense Refill  . aspirin 81 MG EC tablet Take 1 tablet (81 mg total) by mouth daily. 30 tablet 6  . Blood Glucose Monitoring Suppl (TRUE METRIX METER) W/DEVICE KIT 1 each by Does not apply route once as needed. 1 kit 0  . carvedilol (COREG) 12.5 MG tablet TAKE 1 1/2 TABLET BY MOUTH 2 TIMES DAILY WITH A MEAL 90 tablet 2  . Cholecalciferol (VITAMIN D PO) Take 1,000 Units by mouth daily.     . furosemide (LASIX) 40 MG tablet Take 1 tablet (40 mg total) by mouth daily as needed. 90 tablet 3  . glucose blood (TRUE METRIX BLOOD GLUCOSE TEST) test strip Used 3 times daily before meals. 100 each 12  . hydrALAZINE (APRESOLINE) 50 MG tablet TAKE 1&1/2 TABLET 3 TIMES DAILY 135 tablet 5  . isosorbide  mononitrate (IMDUR) 30 MG 24 hr tablet Take 1 tablet (30 mg total) by mouth daily. 90 tablet 3  . KLOR-CON 10 10 MEQ tablet TAKE 1 TABLET EVERY DAY 30 tablet 3  . metFORMIN (GLUCOPHAGE) 500 MG tablet Take 1 tablet (500 mg total) by mouth 2 (two) times daily with a meal. 180 tablet 0  . potassium chloride (K-DUR) 10 MEQ tablet TAKE 1 TABLET BY MOUTH EVERY DAY 30 tablet 3  . sacubitril-valsartan (ENTRESTO) 97-103 MG Take 1 tablet by mouth 2 (two) times daily. 60 tablet 11  . spironolactone (ALDACTONE) 25 MG tablet TAKE 1 TABLET BY MOUTH EVERY DAY 30 tablet 3  . spironolactone (ALDACTONE) 25 MG tablet TAKE 1 TABLET BY MOUTH EVERY DAY 30 tablet 3  . TRUEPLUS LANCETS 30G MISC 1 each by Does not apply route every morning. 50 each 11   No current facility-administered medications on file prior to visit.     BP 140/90   Temp 98.5 F (36.9 C)   Wt (!) 312 lb (141.5 kg)   BMI 42.91 kg/m       Objective:   Physical Exam Vitals signs and nursing note reviewed.  Constitutional:      Appearance: Normal appearance.  Cardiovascular:     Rate and Rhythm: Normal rate and regular rhythm.     Pulses: Normal pulses.     Heart sounds: Normal heart sounds.  Pulmonary:     Effort: Pulmonary effort is normal.     Breath sounds: Normal breath sounds.  Musculoskeletal: Normal range of motion.        General: Tenderness (pain with palpation under left scapula ) present. No swelling.  Skin:    General: Skin is warm and dry.  Neurological:     General: No focal deficit present.     Mental Status: He is alert and oriented to person, place, and time. Mental status is at baseline.       Assessment & Plan:  1. Muscle strain - use heating pad and motrin  - cyclobenzaprine (FLEXERIL) 10 MG tablet; Take 1 tablet (10 mg total) by mouth 3 (three) times daily as needed for muscle spasms.  Dispense: 30 tablet; Refill: 0 - Follow up if no improvement in the next 2-3 days   Dorothyann Peng, NP

## 2018-10-28 ENCOUNTER — Other Ambulatory Visit: Payer: Self-pay | Admitting: Internal Medicine

## 2018-10-28 DIAGNOSIS — I509 Heart failure, unspecified: Secondary | ICD-10-CM

## 2018-10-29 ENCOUNTER — Other Ambulatory Visit: Payer: Self-pay | Admitting: Internal Medicine

## 2018-10-29 DIAGNOSIS — I509 Heart failure, unspecified: Secondary | ICD-10-CM

## 2018-11-03 ENCOUNTER — Encounter: Payer: BC Managed Care – PPO | Admitting: Adult Health

## 2018-11-05 ENCOUNTER — Other Ambulatory Visit (HOSPITAL_COMMUNITY): Payer: Self-pay | Admitting: Adult Health

## 2018-11-17 ENCOUNTER — Other Ambulatory Visit (HOSPITAL_COMMUNITY): Payer: Self-pay | Admitting: Adult Health

## 2018-11-28 ENCOUNTER — Other Ambulatory Visit: Payer: Self-pay | Admitting: Internal Medicine

## 2018-11-28 DIAGNOSIS — I509 Heart failure, unspecified: Secondary | ICD-10-CM

## 2018-11-29 ENCOUNTER — Other Ambulatory Visit (HOSPITAL_COMMUNITY): Payer: Self-pay | Admitting: Adult Health

## 2018-11-29 DIAGNOSIS — I5022 Chronic systolic (congestive) heart failure: Secondary | ICD-10-CM

## 2018-11-29 DIAGNOSIS — I10 Essential (primary) hypertension: Secondary | ICD-10-CM

## 2018-11-30 NOTE — Telephone Encounter (Signed)
Patient was last seen 01/2018 was suppose to f/u in jan 2020 but did not. Please advise on refill

## 2018-11-30 NOTE — Telephone Encounter (Signed)
Pt is requesting a refill on IMDUR 30mg  but was advised to have a f/u with you in 6 mo. Last seen 7/19. Please advise.

## 2018-12-13 ENCOUNTER — Other Ambulatory Visit (HOSPITAL_COMMUNITY): Payer: Self-pay | Admitting: Adult Health

## 2018-12-15 ENCOUNTER — Other Ambulatory Visit (HOSPITAL_COMMUNITY): Payer: Self-pay | Admitting: Adult Health

## 2019-01-27 ENCOUNTER — Ambulatory Visit (INDEPENDENT_AMBULATORY_CARE_PROVIDER_SITE_OTHER): Payer: BC Managed Care – PPO | Admitting: Adult Health

## 2019-01-27 ENCOUNTER — Other Ambulatory Visit: Payer: Self-pay

## 2019-01-27 ENCOUNTER — Encounter: Payer: Self-pay | Admitting: Adult Health

## 2019-01-27 DIAGNOSIS — A5903 Trichomonal cystitis and urethritis: Secondary | ICD-10-CM

## 2019-01-27 MED ORDER — METRONIDAZOLE 500 MG PO TABS: 500.0000 mg | ORAL_TABLET | Freq: Two times a day (BID) | ORAL | 0 refills | Status: DC

## 2019-01-27 NOTE — Progress Notes (Signed)
Virtual Visit via Video Note  I connected with Vernon Reed on 01/27/19 at  3:30 PM EDT by a video enabled telemedicine application and verified that I am speaking with the correct person using two identifiers.  Location patient: home Location provider:work or home office Persons participating in the virtual visit: patient, provider  I discussed the limitations of evaluation and management by telemedicine and the availability of in person appointments. The patient expressed understanding and agreed to proceed.   HPI: 43 year old male who is being evaluated today for concern of STDs.  Reports that he has recently started in a new relationship and has had unprotected sex with his new partner approximately 4 days ago.  She found out yesterday that she tested positive for trichomonas.  He denies signs or symptoms   ROS: See pertinent positives and negatives per HPI.  Past Medical History:  Diagnosis Date  . CHF (congestive heart failure) ([place])   . Chicken pox   . GERD (gastroesophageal reflux disease)   . Headache    "maybe 5 times/yr; nothing regular" (12/06/2014)  . Hyperlipemia   . Hypertension   . OSA (obstructive sleep apnea)    "should wear mask but I don't have one" (12/06/2014)  . Sleep apnea   . Type II diabetes mellitus ([place])     Past Surgical History:  Procedure Laterality Date  . 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 APPENDECTOMY  ~ 1989  . CARDIAC CATHETERIZATION  07/2013  . LEFT AND RIGHT HEART CATHETERIZATION WITH CORONARY ANGIOGRAM N/A 07/26/2013   Procedure: LEFT AND RIGHT HEART CATHETERIZATION WITH CORONARY ANGIOGRAM;  Surgeon: Leonie Man, MD;  Location[facility] CATH LAB;  Service: Cardiovascular;  Laterality: N/A;    Family History  Problem Relation Age of Onset  . Hypertension Mother   . Hypertension Father   . Sleep apnea Father        Current Outpatient Medications:  .  aspirin 81 MG EC tablet, Take 1 tablet (81 mg  total) by mouth daily., Disp: 30 tablet, Rfl: 6 .  Blood Glucose Monitoring Suppl (TRUE METRIX METER) W/DEVICE KIT, 1 each by Does not apply route once as needed., Disp: 1 kit, Rfl: 0 .  carvedilol (COREG) 12.5 MG tablet, TAKE 1 1/2 TABLET BY MOUTH 2 TIMES DAILY WITH A MEAL, Disp: 90 tablet, Rfl: 0 .  Cholecalciferol (VITAMIN D PO), Take 1,000 Units by mouth daily. , Disp: , Rfl:  .  cyclobenzaprine (FLEXERIL) 10 MG tablet, Take 1 tablet (10 mg total) by mouth 3 (three) times daily as needed for muscle spasms., Disp: 30 tablet, Rfl: 0 .  ENTRESTO 97-103 MG, TAKE 1 TABLET BY MOUTH 2 (TWO) TIMES DAILY. NEED FOLLOW UP APPT FOR FUTURE REFILLS [phone], Disp: 30 tablet, Rfl: 0 .  furosemide (LASIX) 40 MG tablet, TAKE 1 TABLET (40 MG TOTAL) BY MOUTH DAILY AS NEEDED., Disp: 90 tablet, Rfl: 0 .  glucose blood (TRUE METRIX BLOOD GLUCOSE TEST) test strip, Used 3 times daily before meals., Disp: 100 each, Rfl: 12 .  hydrALAZINE (APRESOLINE) 50 MG tablet, TAKE 1&1/2 TABLET 3 TIMES DAILY, Disp: 135 tablet, Rfl: 5 .  isosorbide mononitrate (IMDUR) 30 MG 24 hr tablet, Take 1 tablet (30 mg total) by mouth daily. NEEDS APPOINTMENT, Disp: 30 tablet, Rfl: 1 .  KLOR-CON 10 10 MEQ tablet, TAKE 1 TABLET EVERY DAY, Disp: 30 tablet, Rfl: 3 .  metFORMIN (GLUCOPHAGE) 500 MG tablet, Take 1 tablet (500 mg  total) by mouth 2 (two) times daily with a meal., Disp: 180 tablet, Rfl: 0 .  potassium chloride (K-DUR) 10 MEQ tablet, TAKE 1 TABLET BY MOUTH EVERY DAY, Disp: 30 tablet, Rfl: 2 .  spironolactone (ALDACTONE) 25 MG tablet, TAKE 1 TABLET BY MOUTH EVERY DAY, Disp: 30 tablet, Rfl: 3 .  spironolactone (ALDACTONE) 25 MG tablet, TAKE 1 TABLET BY MOUTH EVERY DAY, Disp: 30 tablet, Rfl: 3 .  TRUEPLUS LANCETS 30G MISC, 1 each by Does not apply route every morning., Disp: 50 each, Rfl: 11  EXAM:  VITALS per patient if applicable:  GENERAL: alert, oriented, appears well and in no acute distress  HEENT: atraumatic, conjunttiva  clear, no obvious abnormalities on inspection of external nose and ears  NECK: normal movements of the head and neck  LUNGS: on inspection no signs of respiratory distress, breathing rate appears normal, no obvious gross SOB, gasping or wheezing  CV: no obvious cyanosis  MS: moves all visible extremities without noticeable abnormality  PSYCH/NEURO: pleasant and cooperative, no obvious depression or anxiety, speech and thought processing grossly intact  ASSESSMENT AND PLAN:  Discussed the following assessment and plan:  1. 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  Dispense: 14 tablet; Refill: 0 - He is coming in next week for his CPE. He would like to wait until then to be tested for other STD's  - Advised not to drink alcohol while taking this medication  - Encouraged safe sex practices    I discussed the assessment and treatment plan with the patient. The patient was provided an opportunity to ask questions and all were answered. The patient agreed with the plan and demonstrated an understanding of the instructions.   The patient was advised to call back or seek an in-person evaluation if the symptoms worsen or if the condition fails to improve as anticipated.   Dorothyann Peng, NP

## 2019-02-01 ENCOUNTER — Encounter: Payer: Self-pay | Admitting: Adult Health

## 2019-02-01 ENCOUNTER — Other Ambulatory Visit: Payer: Self-pay

## 2019-02-01 ENCOUNTER — Other Ambulatory Visit: Payer: Self-pay | Admitting: Adult Health

## 2019-02-01 ENCOUNTER — Ambulatory Visit (INDEPENDENT_AMBULATORY_CARE_PROVIDER_SITE_OTHER): Payer: BC Managed Care – PPO | Admitting: Adult Health

## 2019-02-01 ENCOUNTER — Other Ambulatory Visit (HOSPITAL_COMMUNITY)
Admission: RE | Admit: 2019-02-01 | Discharge: 2019-02-01 | Disposition: A | Discharge status: Home / Self Care | Payer: BC Managed Care – PPO | Source: Ambulatory Visit | Attending: Adult Health | Admitting: Adult Health

## 2019-02-01 VITALS — BP 126/82 | Temp 97.6°F | Ht 72.0 in | Wt 315.0 lb

## 2019-02-01 DIAGNOSIS — Z0000 Encounter for general adult medical examination without abnormal findings: Secondary | ICD-10-CM | POA: Diagnosis not present

## 2019-02-01 DIAGNOSIS — Z202 Contact with and (suspected) exposure to infections with a predominantly sexual mode of transmission: Secondary | ICD-10-CM

## 2019-02-01 DIAGNOSIS — E1165 Type 2 diabetes mellitus with hyperglycemia: Secondary | ICD-10-CM

## 2019-02-01 DIAGNOSIS — Z125 Encounter for screening for malignant neoplasm of prostate: Secondary | ICD-10-CM | POA: Diagnosis not present

## 2019-02-01 DIAGNOSIS — I10 Essential (primary) hypertension: Secondary | ICD-10-CM

## 2019-02-01 DIAGNOSIS — I5022 Chronic systolic (congestive) heart failure: Secondary | ICD-10-CM

## 2019-02-01 LAB — COMPREHENSIVE METABOLIC PANEL
ALT: 22 U/L (ref 0–53)
AST: 16 U/L (ref 0–37)
Albumin: 4.2 g/dL (ref 3.5–5.2)
Alkaline Phosphatase: 46 U/L (ref 39–117)
BUN: 16 mg/dL (ref 6–23)
CO2: 32 mEq/L (ref 19–32)
Calcium: 9.2 mg/dL (ref 8.4–10.5)
Chloride: 101 mEq/L (ref 96–112)
Creatinine, Ser: 1.24 mg/dL (ref 0.40–1.50)
GFR: 76.57 mL/min (ref >60.00)
Glucose, Bld: 127 mg/dL — ABNORMAL HIGH (ref 70–99)
Potassium: 4.3 mEq/L (ref 3.5–5.1)
Sodium: 139 mEq/L (ref 135–145)
Total Bilirubin: 0.7 mg/dL (ref 0.2–1.2)
Total Protein: 7 g/dL (ref 6.0–8.3)

## 2019-02-01 LAB — LIPID PANEL
Cholesterol: 169 mg/dL (ref 0–200)
HDL: 32.5 mg/dL — ABNORMAL LOW (ref >39.00)
LDL Cholesterol: 112 mg/dL — ABNORMAL HIGH (ref 0–99)
NonHDL: 136.73
Total CHOL/HDL Ratio: 5
Triglycerides: 122 mg/dL (ref 0.0–149.0)
VLDL: 24.4 mg/dL (ref 0.0–40.0)

## 2019-02-01 LAB — CBC WITH DIFFERENTIAL/PLATELET
Basophils Absolute: 0 10*3/uL (ref 0.0–0.1)
Basophils Relative: 0.5 % (ref 0.0–3.0)
Eosinophils Absolute: 0.3 10*3/uL (ref 0.0–0.7)
Eosinophils Relative: 3.6 % (ref 0.0–5.0)
HCT: 38.9 % — ABNORMAL LOW (ref 39.0–52.0)
Hemoglobin: 12.6 g/dL — ABNORMAL LOW (ref 13.0–17.0)
Lymphocytes Relative: 28.4 % (ref 12.0–46.0)
Lymphs Abs: 2.2 10*3/uL (ref 0.7–4.0)
MCHC: 32.4 g/dL (ref 30.0–36.0)
MCV: 93.2 fl (ref 78.0–100.0)
Monocytes Absolute: 0.4 10*3/uL (ref 0.1–1.0)
Monocytes Relative: 4.9 % (ref 3.0–12.0)
Neutro Abs: 4.8 10*3/uL (ref 1.4–7.7)
Neutrophils Relative %: 62.6 % (ref 43.0–77.0)
Platelets: 285 10*3/uL (ref 150.0–400.0)
RBC: 4.18 Mil/uL — ABNORMAL LOW (ref 4.22–5.81)
RDW: 14.2 % (ref 11.5–15.5)
WBC: 7.7 10*3/uL (ref 4.0–10.5)

## 2019-02-01 LAB — PSA: PSA: 0.14 ng/mL (ref 0.10–4.00)

## 2019-02-01 LAB — TSH: TSH: 2.83 u[IU]/mL (ref 0.35–4.50)

## 2019-02-01 LAB — HEMOGLOBIN A1C: Hgb A1c MFr Bld: 6.7 % — ABNORMAL HIGH (ref 4.6–6.5)

## 2019-02-01 MED ORDER — METFORMIN HCL 500 MG PO TABS: 500.0000 mg | ORAL_TABLET | Freq: Two times a day (BID) | ORAL | 0 refills | Status: DC

## 2019-02-01 NOTE — Progress Notes (Signed)
Subjective:    Patient ID: Vernon Reed, male    DOB: April 30, 1976, 43 y.o.   MRN: [id number]  HPI Patient presents for yearly preventative medicine examination. He is a pleasant 43 year old male who  has a past medical history of CHF (congestive heart failure) ([name]), Chicken pox, GERD (gastroesophageal reflux disease), Headache, Hyperlipemia, Hypertension, OSA (obstructive sleep apnea), Sleep apnea, and Type II diabetes mellitus ([place]).  DM -only prescribed metformin 500 mg twice daily.  He was last seen for diabetes in January at which time his A1c was 7.9.  He was advised to work on weight loss through heart healthy diet and exercise.  He was to follow-up in 3 months but failed to do so. He reports that he has not been checking his blood sugars at home and has only been taking Metformin daily.  Lab Results  Component Value Date   HGBA1C 7.9 (A) 08/04/2018   OSA - has not been wearing his CPAP " I need a new adaptor"  Hypertension -is managed by cardiology.  Currently prescribed Coreg 12.5 mg twice daily,Entresto 97-103 mg daily, Lasix 40 mg daily, hydralazine 75 mg TID,, Aldactone 25 mg daily and Imdur 30 mg daily. He is due for Cardiology follow up.   CHF -is managed by cardiology. Takes Entresto, Lasix, Coreg, hydralazine, and Imdur.  His last echo in April 2019 showed that his EF improved to 45 to 50%.Denies chest pain, shortness of breath, or lower extremity edema   STD Concern - was seen last week via virtual visit after a partner of his tested positive for Trichomonas. Flagyl was sent in but he never started it, states " I wanted to be tested first." Denies symptoms   All immunizations and health maintenance protocols were reviewed with the patient and needed orders were placed.  Appropriate screening laboratory values were ordered for the patient including screening of hyperlipidemia, renal function and hepatic function. If indicated by BPH, a PSA was ordered.  Medication  reconciliation,  past medical history, social history, problem list and allergies were reviewed in detail with the patient  Goals were established with regard to weight loss, exercise, and  diet in compliance with medications. He is not eating healthy and not exercising.   Wt Readings from Last 3 Encounters:  02/01/19 (!) 315 lb (142.9 kg)  09/21/18 (!) 312 lb (141.5 kg)  08/04/18 (!) 315 lb (142.9 kg)    End of life planning was discussed.   Review of Systems  Constitutional: Negative.   HENT: Negative.   Eyes: Negative.   Respiratory: Negative.   Cardiovascular: Negative.   Gastrointestinal: Negative.   Endocrine: Negative.   Genitourinary: Negative.   Musculoskeletal: Negative.   Skin: Negative.   Allergic/Immunologic: Negative.   Neurological: Negative.   Hematological: Negative.   Psychiatric/Behavioral: Negative.   All other systems reviewed and are negative.  Past Medical History:  Diagnosis Date  . CHF (congestive heart failure) ([name])   . Chicken pox   . GERD (gastroesophageal reflux disease)   . Headache    "maybe 5 times/yr; nothing regular" (12/06/2014)  . Hyperlipemia   . Hypertension   . OSA (obstructive sleep apnea)    "should wear mask but I don't have one" (12/06/2014)  . Sleep apnea   . Type II diabetes mellitus ([name])     Social History   Socioeconomic History  . Marital status: Divorced    Spouse name: Not on file  . Number of children: 3  .  Years of education: Not on file  . Highest education level: Not on file  Occupational History  . Occupation: Herbalist: [employer]  Social Needs  . Financial resource strain: Not on file  . Food insecurity    Worry: Not on file    Inability: Not on file  . Transportation needs    Medical: Not on file    Non-medical: Not on file  Tobacco Use  . Smoking status: Never Smoker  . Smokeless tobacco: Never Used  Substance and Sexual Activity  . Alcohol use: No    Comment: 12/06/2014 "might  have a beer a couple times/month"  . Drug use: No  . Sexual activity: Yes  Lifestyle  . Physical activity    Days per week: Not on file    Minutes per session: Not on file  . Stress: Not on file  Relationships  . Social Herbalist on phone: Not on file    Gets together: Not on file    Attends religious service: Not on file    Active member of club or organization: Not on file    Attends meetings of clubs or organizations: Not on file    Relationship status: Not on file  . Intimate partner violence    Fear of current or ex partner: Not on file    Emotionally abused: Not on file    Physically abused: Not on file    Forced sexual activity: Not on file  Other Topics Concern  . 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        Past Surgical History:  Procedure Laterality Date  . 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 APPENDECTOMY  ~ 1989  . CARDIAC CATHETERIZATION  07/2013  . LEFT AND RIGHT HEART CATHETERIZATION WITH CORONARY ANGIOGRAM N/A 07/26/2013   Procedure: LEFT AND RIGHT HEART CATHETERIZATION WITH CORONARY ANGIOGRAM;  Surgeon: Leonie Man, MD;  Location: [facility] C[facility] LAB;  Service: Cardiovascular;  Laterality: N/A;    Family History  Problem Relation Age of Onset  . Hypertension Mother   . Hypertension Father   . Sleep apnea Father     No Known Allergies  Current Outpatient Medications on File Prior to Visit  Medication Sig Dispense Refill  . aspirin 81 MG EC tablet Take 1 tablet (81 mg total) by mouth daily. 30 tablet 6  . Blood Glucose Monitoring Suppl (TRUE METRIX METER) W/DEVICE KIT 1 each by Does not apply route once as needed. 1 kit 0  . carvedilol (COREG) 12.5 MG tablet TAKE 1 1/2 TABLET BY MOUTH 2 TIMES DAILY WITH A MEAL 90 tablet 0  . Cholecalciferol (VITAMIN D PO) Take 1,000 Units by mouth daily.     . cyclobenzaprine (FLEXERIL) 10 MG tablet Take 1 tablet (10 mg  total) by mouth 3 (three) times daily as needed for muscle spasms. 30 tablet 0  . ENTRESTO 97-103 MG TAKE 1 TABLET BY MOUTH 2 (TWO) TIMES DAILY. NEED FOLLOW UP APPT FOR FUTURE REFILLS [phone] 30 tablet 0  . furosemide (LASIX) 40 MG tablet TAKE 1 TABLET (40 MG TOTAL) BY MOUTH DAILY AS NEEDED. 90 tablet 0  . glucose blood (TRUE METRIX BLOOD GLUCOSE TEST) test strip Used 3 times daily before meals. 100 each 12  . hydrALAZINE (APRESOLINE) 50 MG tablet TAKE 1&1/2 TABLET 3 TIMES DAILY 135 tablet  5  . isosorbide mononitrate (IMDUR) 30 MG 24 hr tablet Take 1 tablet (30 mg total) by mouth daily. NEEDS APPOINTMENT 30 tablet 1  . KLOR-CON 10 10 MEQ tablet TAKE 1 TABLET EVERY DAY 30 tablet 3  . metFORMIN (GLUCOPHAGE) 500 MG tablet Take 1 tablet (500 mg total) by mouth 2 (two) times daily with a meal. 180 tablet 0  . metroNIDAZOLE (FLAGYL) 500 MG tablet Take 1 tablet (500 mg total) by mouth 2 (two) times daily. 14 tablet 0  . potassium chloride (K-DUR) 10 MEQ tablet TAKE 1 TABLET BY MOUTH EVERY DAY 30 tablet 2  . spironolactone (ALDACTONE) 25 MG tablet TAKE 1 TABLET BY MOUTH EVERY DAY 30 tablet 3  . spironolactone (ALDACTONE) 25 MG tablet TAKE 1 TABLET BY MOUTH EVERY DAY 30 tablet 3  . TRUEPLUS LANCETS 30G MISC 1 each by Does not apply route every morning. 50 each 11   No current facility-administered medications on file prior to visit.     There were no vitals taken for this visit.      Objective:   Physical Exam Vitals signs and nursing note reviewed.  Constitutional:      General: He is not in acute distress.    Appearance: Normal appearance. He is obese. He is not diaphoretic.  HENT:     Head: Normocephalic and atraumatic.     Right Ear: Tympanic membrane, ear canal and external ear normal. There is no impacted cerumen.     Left Ear: Tympanic membrane, ear canal and external ear normal. There is no impacted cerumen.     Nose: Nose normal. No congestion or rhinorrhea.     Mouth/Throat:      Mouth: Mucous membranes are moist.     Pharynx: Oropharynx is clear. No oropharyngeal exudate or posterior oropharyngeal erythema.  Eyes:     General: No scleral icterus.       Right eye: No discharge.        Left eye: No discharge.     Extraocular Movements: Extraocular movements intact.     Conjunctiva/sclera: Conjunctivae normal.     Pupils: Pupils are equal, round, and reactive to light.  Neck:     Musculoskeletal: Normal range of motion and neck supple.     Thyroid: No thyromegaly.     Vascular: No JVD.     Trachea: No tracheal deviation.  Cardiovascular:     Rate and Rhythm: Normal rate and regular rhythm.     Pulses: Normal pulses.     Heart sounds: Normal heart sounds. No murmur. No friction rub. No gallop.   Pulmonary:     Effort: Pulmonary effort is normal. No respiratory distress.     Breath sounds: Normal breath sounds. No stridor. No wheezing, rhonchi or rales.  Chest:     Chest wall: No tenderness.  Abdominal:     General: Bowel sounds are normal. There is no distension.     Palpations: Abdomen is soft. There is no mass.     Tenderness: There is no abdominal tenderness. There is no right CVA tenderness, left CVA tenderness, guarding or rebound.     Hernia: No hernia is present.  Musculoskeletal: Normal range of motion.        General: No swelling, tenderness, deformity or signs of injury.     Right lower leg: No edema.     Left lower leg: No edema.  Lymphadenopathy:     Cervical: No cervical adenopathy.  Skin:    General:  Skin is warm and dry.     Capillary Refill: Capillary refill takes less than 2 seconds.     Coloration: Skin is not jaundiced or pale.     Findings: No bruising, erythema, lesion or rash.  Neurological:     General: No focal deficit present.     Mental Status: He is alert and oriented to person, place, and time.     Cranial Nerves: No cranial nerve deficit.     Sensory: No sensory deficit.     Motor: No weakness or abnormal muscle tone.      Coordination: Coordination normal.     Gait: Gait normal.     Deep Tendon Reflexes: Reflexes are normal and symmetric. Reflexes normal.  Psychiatric:        Mood and Affect: Mood normal.        Behavior: Behavior normal.        Thought Content: Thought content normal.        Judgment: Judgment normal.       Assessment & Plan:  1. Routine general medical examination at a health care facility - Needs to work on diet and exercise  - Follow up in one year or sooner If needed - CBC with Differential/Platelet - Comprehensive metabolic panel - Hemoglobin A1c - Lipid panel - TSH  2. Essential hypertension - Well controlled  - CBC with Differential/Platelet - Comprehensive metabolic panel - Hemoglobin A1c - Lipid panel - TSH  3. Type 2 diabetes mellitus with hyperglycemia, unspecified whether long term insulin use ([place]) - Likely need to add agent  - 3 month follow up.  - CBC with Differential/Platelet - Comprehensive metabolic panel - Hemoglobin A1c - Lipid panel - TSH  4. Obesity, morbid ([facility]) - Needs significant weight loss  - Encouraged weight loss through diet and exerise - CBC with Differential/Platelet - Comprehensive metabolic panel - Hemoglobin A1c - Lipid panel - TSH  5. Chronic systolic heart failure ([facility]) - Follow up Cardiology as directed  - CBC with Differential/Platelet - Comprehensive metabolic panel - Hemoglobin A1c - Lipid panel - TSH  6. STD exposure - HIV Antibody (routine testing w rflx) - Urine cytology ancillary only - RPR  7. Prostate cancer screening  - PSA  Dorothyann Peng, NP

## 2019-02-02 LAB — URINE CYTOLOGY ANCILLARY ONLY
Chlamydia: NEGATIVE
Neisseria Gonorrhea: NEGATIVE
Trichomonas: NEGATIVE

## 2019-02-02 LAB — RPR: RPR Ser Ql: NONREACTIVE

## 2019-02-02 LAB — HIV ANTIBODY (ROUTINE TESTING W REFLEX): HIV 1&2 Ab, 4th Generation: NONREACTIVE

## 2019-02-05 ENCOUNTER — Other Ambulatory Visit (HOSPITAL_COMMUNITY): Payer: Self-pay | Admitting: Cardiology

## 2019-02-05 DIAGNOSIS — I5022 Chronic systolic (congestive) heart failure: Secondary | ICD-10-CM

## 2019-02-05 DIAGNOSIS — I10 Essential (primary) hypertension: Secondary | ICD-10-CM

## 2019-03-08 ENCOUNTER — Other Ambulatory Visit (HOSPITAL_COMMUNITY): Payer: Self-pay | Admitting: Cardiology

## 2019-03-08 DIAGNOSIS — I10 Essential (primary) hypertension: Secondary | ICD-10-CM

## 2019-03-08 DIAGNOSIS — I5022 Chronic systolic (congestive) heart failure: Secondary | ICD-10-CM

## 2019-03-09 ENCOUNTER — Other Ambulatory Visit: Payer: Self-pay | Admitting: Internal Medicine

## 2019-03-09 DIAGNOSIS — I509 Heart failure, unspecified: Secondary | ICD-10-CM

## 2019-03-11 ENCOUNTER — Other Ambulatory Visit: Payer: Self-pay | Admitting: Cardiology

## 2019-03-11 DIAGNOSIS — I509 Heart failure, unspecified: Secondary | ICD-10-CM

## 2019-03-25 ENCOUNTER — Other Ambulatory Visit: Payer: Self-pay

## 2019-03-25 ENCOUNTER — Encounter (HOSPITAL_COMMUNITY): Payer: Self-pay

## 2019-03-25 ENCOUNTER — Ambulatory Visit (HOSPITAL_COMMUNITY)
Admission: RE | Admit: 2019-03-25 | Discharge: 2019-03-25 | Disposition: A | Discharge status: Home / Self Care | Payer: BC Managed Care – PPO | Source: Ambulatory Visit | Attending: Adult Health | Admitting: Adult Health

## 2019-03-25 VITALS — BP 138/86 | HR 74 | Wt 312.2 lb

## 2019-03-25 DIAGNOSIS — I5022 Chronic systolic (congestive) heart failure: Secondary | ICD-10-CM | POA: Insufficient documentation

## 2019-03-25 DIAGNOSIS — Z6841 Body Mass Index (BMI) 40.0 and over, adult: Secondary | ICD-10-CM | POA: Insufficient documentation

## 2019-03-25 DIAGNOSIS — N182 Chronic kidney disease, stage 2 (mild): Secondary | ICD-10-CM | POA: Insufficient documentation

## 2019-03-25 DIAGNOSIS — Z7982 Long term (current) use of aspirin: Secondary | ICD-10-CM | POA: Diagnosis not present

## 2019-03-25 DIAGNOSIS — E785 Hyperlipidemia, unspecified: Secondary | ICD-10-CM | POA: Diagnosis not present

## 2019-03-25 DIAGNOSIS — G4733 Obstructive sleep apnea (adult) (pediatric): Secondary | ICD-10-CM | POA: Diagnosis not present

## 2019-03-25 DIAGNOSIS — I10 Essential (primary) hypertension: Secondary | ICD-10-CM | POA: Diagnosis not present

## 2019-03-25 DIAGNOSIS — I428 Other cardiomyopathies: Secondary | ICD-10-CM | POA: Diagnosis not present

## 2019-03-25 DIAGNOSIS — E1122 Type 2 diabetes mellitus with diabetic chronic kidney disease: Secondary | ICD-10-CM | POA: Diagnosis not present

## 2019-03-25 DIAGNOSIS — Z7984 Long term (current) use of oral hypoglycemic drugs: Secondary | ICD-10-CM | POA: Diagnosis not present

## 2019-03-25 DIAGNOSIS — K219 Gastro-esophageal reflux disease without esophagitis: Secondary | ICD-10-CM | POA: Insufficient documentation

## 2019-03-25 DIAGNOSIS — Z79899 Other long term (current) drug therapy: Secondary | ICD-10-CM | POA: Insufficient documentation

## 2019-03-25 DIAGNOSIS — Z8249 Family history of ischemic heart disease and other diseases of the circulatory system: Secondary | ICD-10-CM | POA: Diagnosis not present

## 2019-03-25 DIAGNOSIS — I130 Hypertensive heart and chronic kidney disease with heart failure and stage 1 through stage 4 chronic kidney disease, or unspecified chronic kidney disease: Secondary | ICD-10-CM | POA: Diagnosis not present

## 2019-03-25 NOTE — Patient Instructions (Signed)
It was great to see you today! No medication changes are needed at this time.   Your physician has requested that you have an echocardiogram. Echocardiography is a painless test that uses sound waves to create images of your heart. It provides your doctor with information about the size and shape of your heart and how well your heart's chambers and valves are working. This procedure takes approximately one hour. There are no restrictions for this procedure.  Your physician recommends that you schedule a follow-up appointment in: 12 months with Dr Aundra Dubin  Do the following things EVERYDAY: 1) Weigh yourself in the morning before breakfast. Write it down and keep it in a log. 2) Take your medicines as prescribed 3) Eat low salt foods-Limit salt (sodium) to 2000 mg per day.  4) Stay as active as you can everyday 5) Limit all fluids for the day to less than 2 liters   At the [facility], you and your health needs are our priority. As part of our continuing mission to provide you with exceptional heart care, we have created designated Provider Care Teams. These Care Teams include your primary Cardiologist (physician) and Advanced Practice Providers (APPs- Physician Assistants and Nurse Practitioners) who all work together to provide you with the care you need, when you need it.   You may see any of the following providers on your designated Care Team at your next follow up: Marland Kitchen Dr Glori Bickers . Dr Loralie Champagne . Darrick Grinder, NP   Please be sure to bring in all your medications bottles to every appointment.

## 2019-03-25 NOTE — Progress Notes (Signed)
Patient ID: Vernon Reed, male   DOB: Dec 12, 1975, 43 y.o.   MRN: [id number]     Advanced Heart Failure Clinic Note   PCP: [facility]  Cardiology: Aundra Dubin  HPI: Vernon Reed is a 43 yo male with a history of HTN, HLD, OSA on CPAP, obesity and chronic systolic HF.   Admitted 6/23-6/25/15 for SOB. He was diuresed and medications restarted which he became hypotensive and were scaled back. He had AKI. Discharge weight was 310 lbs  Today he returns for HF follow up.Overall feeling fine. Denies SOB/PND/Orthopnea. Appetite ok. No fever or chills. Weight at home has been stable. Not using CPAP because he says its broken. Not exercising. Taking all medications. He continues to work full time.    RHC/LHC  07/26/13  RA 10 RV 36/10/13 PA 34/16 (23) PCWP [id number] Fick CO/CI 6.18/2.4 Normal Cors   ECHO 07/2013: EF 30-35% ECHO 12/28/2013: EF 45-50%, grade 1 DD.  ECHO 6/16: EF 25%, moderate LVH, mild LV dilation, RV mildly dilated with mildly decreased systolic function ECHO 18/29/93: EF 35-40%, Grade 1 DD. 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 ETOH occasionally. No tobacco abuse or drugs.   FH: Mother living: - HTN, HLD       Father- HTN  Past Medical History:  Diagnosis Date  . CHF (congestive heart failure) ([place])   . Chicken pox   . GERD (gastroesophageal reflux disease)   . Headache    "maybe 5 times/yr; nothing regular" (12/06/2014)  . Hyperlipemia   . Hypertension   . OSA (obstructive sleep apnea)   . Sleep apnea   . Type II diabetes mellitus ([place])     Current Outpatient Medications  Medication Sig Dispense Refill  . aspirin 81 MG EC tablet Take 1 tablet (81 mg total) by mouth daily. 30 tablet 6  .  Blood Glucose Monitoring Suppl (TRUE METRIX METER) W/DEVICE KIT 1 each by Does not apply route once as needed. 1 kit 0  . carvedilol (COREG) 12.5 MG tablet TAKE 1 1/2 TABLET BY MOUTH 2 TIMES DAILY WITH A MEAL 90 tablet 0  . Cholecalciferol (VITAMIN D PO) Take 1,000 Units by mouth daily.     Marland Kitchen ENTRESTO 97-103 MG TAKE 1 TABLET BY MOUTH 2 (TWO) TIMES DAILY. NEED FOLLOW UP APPT FOR FUTURE REFILLS [phone] 30 tablet 0  . furosemide (LASIX) 40 MG tablet TAKE 1 TABLET (40 MG TOTAL) BY MOUTH DAILY AS NEEDED. 90 tablet 0  . glucose blood (TRUE METRIX BLOOD GLUCOSE TEST) test strip Used 3 times daily before meals. 100 each 12  . hydrALAZINE (APRESOLINE) 50 MG tablet TAKE 1&1/2 TABLET 3 TIMES DAILY 135 tablet 5  . isosorbide mononitrate (IMDUR) 30 MG 24 hr tablet TAKE 1 TABLET (30 MG TOTAL) BY MOUTH DAILY. NEEDS APPOINTMENT 30 tablet 0  . metFORMIN (GLUCOPHAGE) 500 MG tablet Take 1 tablet (500 mg total) by mouth 2 (two) times daily with a meal. 180 tablet 0  . potassium chloride (K-DUR) 10 MEQ tablet TAKE 1 TABLET BY MOUTH EVERY DAY 30 tablet 2  . spironolactone (ALDACTONE) 25 MG tablet TAKE 1 TABLET BY MOUTH EVERY DAY 30 tablet 3  . TRUEPLUS  LANCETS 30G MISC 1 each by Does not apply route every morning. 50 each 11   No current facility-administered medications for this encounter.     Vitals:   03/25/19 1026  BP: 138/86  Pulse: 74  SpO2: 96%  Weight: (!) 141.6 kg (312 lb 3.2 oz)   Wt Readings from Last 3 Encounters:  03/25/19 (!) 141.6 kg (312 lb 3.2 oz)  02/01/19 (!) 142.9 kg (315 lb)  09/21/18 (!) 141.5 kg (312 lb)    PHYSICAL EXAM: General:  Well appearing. No resp difficulty HEENT: normal Neck: supple. no JVD. Carotids 2+ bilat; no bruits. No lymphadenopathy or thryomegaly appreciated. Cor: PMI nondisplaced. Regular rate & rhythm. No rubs, gallops or murmurs. Lungs: clear Abdomen: soft, nontender, nondistended. No hepatosplenomegaly. No bruits or masses. Good bowel sounds.  Extremities: no cyanosis, clubbing, rash, edema Neuro: alert & orientedx3, cranial nerves grossly intact. moves all 4 extremities w/o difficulty. Affect pleasant   ASSESSMENT & PLAN:  1) Chronic systolic HF: NICM, EF 24% 6/16 ECHO now improved 45-50% 10/2017  NYHA I. Volume status stable.    -Continue coreg to 18.75 mg twice a day -Continue entresto 97-103 twice a day.  - Continue hydralazine 75 mg tid. Continue imdur 30 mg daily.  2) HTN:   Stable 3) OSA :   Discussed CPAP compliance.  I have asked him to call to get CPAP machine.   4) CKD II: I reviewed BMET from 02/01/19    5)  DMII: Per PCP 6) Morbid Obesity Body mass index is 42.34 kg/m.  Set up ECHO. If EF remains stable. Refer to back to general cardiology.    Britlee Skolnik NP-C  11:01 AM

## 2019-03-27 ENCOUNTER — Other Ambulatory Visit (HOSPITAL_COMMUNITY): Payer: Self-pay | Admitting: Cardiology

## 2019-03-28 ENCOUNTER — Other Ambulatory Visit (HOSPITAL_COMMUNITY): Payer: Self-pay | Admitting: Adult Health

## 2019-03-30 ENCOUNTER — Ambulatory Visit (HOSPITAL_COMMUNITY)
Admission: RE | Admit: 2019-03-30 | Discharge: 2019-03-30 | Disposition: A | Discharge status: Home / Self Care | Payer: BC Managed Care – PPO | Source: Ambulatory Visit | Attending: Adult Health | Admitting: Adult Health

## 2019-03-30 ENCOUNTER — Other Ambulatory Visit: Payer: Self-pay

## 2019-03-30 DIAGNOSIS — I5022 Chronic systolic (congestive) heart failure: Secondary | ICD-10-CM

## 2019-03-30 DIAGNOSIS — E119 Type 2 diabetes mellitus without complications: Secondary | ICD-10-CM | POA: Insufficient documentation

## 2019-03-30 DIAGNOSIS — I110 Hypertensive heart disease with heart failure: Secondary | ICD-10-CM | POA: Diagnosis not present

## 2019-03-30 DIAGNOSIS — I082 Rheumatic disorders of both aortic and tricuspid valves: Secondary | ICD-10-CM | POA: Diagnosis not present

## 2019-03-30 DIAGNOSIS — E785 Hyperlipidemia, unspecified: Secondary | ICD-10-CM | POA: Diagnosis not present

## 2019-03-30 NOTE — Progress Notes (Signed)
  Echocardiogram 2D Echocardiogram has been performed.  Jennette Dubin 03/30/2019, 12:12 PM

## 2019-03-31 ENCOUNTER — Telehealth (HOSPITAL_COMMUNITY): Payer: Self-pay

## 2019-03-31 DIAGNOSIS — I5022 Chronic systolic (congestive) heart failure: Secondary | ICD-10-CM

## 2019-03-31 NOTE — Telephone Encounter (Signed)
-----   Message from Conrad Bradford, NP sent at 03/31/2019  1:10 PM EDT ----- Please call EF normal.  Better than previous ECHO. We will refer him to General Cardiology. Continue current medications. Set up appointment [place] 1 year

## 2019-03-31 NOTE — Telephone Encounter (Signed)
Pt made aware of echo results. Referred patient to general cards. Advised to continue all medications and to call office if he has any concerns over time.  Advised he will get a call to schedule a 1 year f/u with general cards.  Verbalized understanding.

## 2019-04-01 ENCOUNTER — Other Ambulatory Visit: Payer: Self-pay | Admitting: Adult Health

## 2019-04-01 DIAGNOSIS — A5903 Trichomonal cystitis and urethritis: Secondary | ICD-10-CM

## 2019-04-09 ENCOUNTER — Other Ambulatory Visit (HOSPITAL_COMMUNITY): Payer: Self-pay | Admitting: Cardiology

## 2019-04-09 DIAGNOSIS — I5022 Chronic systolic (congestive) heart failure: Secondary | ICD-10-CM

## 2019-04-09 DIAGNOSIS — I10 Essential (primary) hypertension: Secondary | ICD-10-CM

## 2019-04-14 ENCOUNTER — Ambulatory Visit (INDEPENDENT_AMBULATORY_CARE_PROVIDER_SITE_OTHER): Payer: BC Managed Care – PPO | Admitting: Cardiovascular Disease

## 2019-04-14 ENCOUNTER — Other Ambulatory Visit: Payer: Self-pay

## 2019-04-14 ENCOUNTER — Encounter: Payer: Self-pay | Admitting: Cardiovascular Disease

## 2019-04-14 VITALS — BP 132/90 | HR 83 | Ht 72.0 in | Wt 318.1 lb

## 2019-04-14 DIAGNOSIS — I10 Essential (primary) hypertension: Secondary | ICD-10-CM | POA: Diagnosis not present

## 2019-04-14 DIAGNOSIS — I5022 Chronic systolic (congestive) heart failure: Secondary | ICD-10-CM

## 2019-04-14 NOTE — Progress Notes (Signed)
Cardiology Office Note:    Date:  04/14/2019   ID:  Vernon Reed, DOB 1976/05/23, MRN [id number]  PCP:  Dorothyann Peng, NP  Cardiologist:  Wyvonne Lenz, previous CHF clinic  Electrophysiologist:  None   Referring MD: Conrad Judsonia, NP   Chief Complaint  Patient presents with  . Congestive Heart Failure    April 14, 2019   Vernon Reed is a 43 y.o. male with a hx of chronic systolic congestive heart failure.  He has been seen by the heart failure clinic for the past several years and his ejection fraction is now normal.  He has moderate left ventricular hypertrophy.  He has grade 1 diastolic dysfunction.  Takes his meds,  Tries to avoid salt .  No CP  Able to do his normal activties  has been getting some exercise  Knows that he needs to lose some weight .  Has OSA , he wears a CPAP mask. Heart cath in 2015 revealed normal coronary arteries.  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 AKI (acute kidney injury) ([place]) 07/27/2013  . Cardiomyopathy ([place]) 07/25/2013  . CHF (congestive heart failure) ([place])   . Chicken pox   . Chronic systolic heart failure ([place]) 01/04/2014  . GERD (gastroesophageal reflux disease)   . Headache    "maybe 5 times/yr; nothing regular" (12/06/2014)  . Heart block 07/25/2013  . Hyperlipemia   . Hypertension   . Hypokalemia 07/26/2013  . Hypoxia 12/06/2014  . Obesity, morbid ([place]) 12/27/2013  . OSA (obstructive sleep apnea)   . Sleep apnea   . SOB (shortness of breath) 07/23/2013  . Tachy-brady syndrome ([place]) 07/26/2013  . Tachycardia 07/23/2013  . Type II diabetes mellitus ([place])     Past Surgical History:  Procedure Laterality Date  . 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 APPENDECTOMY  ~ 1989  . CARDIAC CATHETERIZATION  07/2013  . LEFT AND RIGHT HEART CATHETERIZATION WITH CORONARY ANGIOGRAM N/A 07/26/2013   Procedure: LEFT AND RIGHT HEART  CATHETERIZATION WITH CORONARY ANGIOGRAM;  Surgeon: Leonie Man, MD;  Location: [facility] CATH LAB;  Service: Cardiovascular;  Laterality: N/A;    Current Medications: Current Meds  Medication Sig  . aspirin 81 MG EC tablet Take 1 tablet (81 mg total) by mouth daily.  . Blood Glucose Monitoring Suppl (TRUE METRIX METER) W/DEVICE KIT 1 each by Does not apply route once as needed.  . carvedilol (COREG) 12.5 MG tablet TAKE 1 1/2 TABLET BY MOUTH 2 TIMES DAILY WITH A MEAL  . Cholecalciferol (VITAMIN D PO) Take 1,000 Units by mouth daily.   Marland Kitchen ENTRESTO 97-103 MG TAKE 1 TABLET BY MOUTH 2 (TWO) TIMES DAILY. NEED FOLLOW UP APPT FOR FUTURE REFILLS [phone]  . furosemide (LASIX) 40 MG tablet TAKE 1 TABLET (40 MG TOTAL) BY MOUTH DAILY AS NEEDED.  Marland Kitchen glucose blood (TRUE METRIX BLOOD GLUCOSE TEST) test strip Used 3 times daily before meals.  . hydrALAZINE (APRESOLINE) 50 MG tablet TAKE 1&1/2 TABLET 3 TIMES DAILY  . isosorbide mononitrate (IMDUR) 30 MG 24 hr tablet TAKE 1 TABLET (30 MG TOTAL) BY MOUTH DAILY. NEEDS APPOINTMENT  . metFORMIN (GLUCOPHAGE) 500 MG tablet Take 1 tablet (500 mg total) by mouth 2 (two) times daily with a meal.  . potassium chloride (K-DUR) 10 MEQ tablet TAKE 1 TABLET BY MOUTH EVERY DAY  . spironolactone (  ALDACTONE) 25 MG tablet TAKE 1 TABLET BY MOUTH EVERY DAY  . TRUEPLUS LANCETS 30G MISC 1 each by Does not apply route every morning.     Allergies:   Patient has no known allergies.   Social History   Socioeconomic History  . Marital status: Divorced    Spouse name: Not on file  . Number of children: 3  . Years of education: Not on file  . Highest education level: Not on file  Occupational History  . Occupation: Herbalist: [employer]  Social Needs  . Financial resource strain: Not on file  . Food insecurity    Worry: Not on file    Inability: Not on file  . Transportation needs    Medical: Not on file    Non-medical: Not on file  Tobacco Use  .  Smoking status: Never Smoker  . Smokeless tobacco: Never Used  Substance and Sexual Activity  . Alcohol use: No    Comment: 12/06/2014 "might have a beer a couple times/month"  . Drug use: No  . Sexual activity: Yes  Lifestyle  . Physical activity    Days per week: Not on file    Minutes per session: Not on file  . Stress: Not on file  Relationships  . Social Herbalist on phone: Not on file    Gets together: Not on file    Attends religious service: Not on file    Active member of club or organization: Not on file    Attends meetings of clubs or organizations: Not on file    Relationship status: Not on file  Other Topics Concern  . 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         Family History: The patient's family history includes Hypertension in his father and mother; Sleep apnea in his father.  ROS:   Please see the history of present illness.     All other systems reviewed and are negative.  EKGs/Labs/Other Studies Reviewed:    The following studies were reviewed today:   EKG:   April 14, 2019: Normal sinus rhythm at 80.  Moderate voltage criteria for left ventricular hypertrophy.  Recent Labs: 02/01/2019: ALT 22; BUN 16; Creatinine, Ser 1.24; Hemoglobin 12.6; Platelets 285.0; Potassium 4.3; Sodium 139; TSH 2.83  Recent Lipid Panel    Component Value Date/Time   CHOL 169 02/01/2019 1038   TRIG 122.0 02/01/2019 1038   HDL 32.50 (L) 02/01/2019 1038   CHOLHDL 5 02/01/2019 1038   VLDL 24.4 02/01/2019 1038   LDLCALC 112 (H) 02/01/2019 1038    Physical Exam:    VS:  BP 132/90   Pulse 83   Ht 6' (1.829 m)   Wt (!) 318 lb 1.9 oz (144.3 kg)   SpO2 94%   BMI 43.14 kg/m     Wt Readings from Last 3 Encounters:  04/14/19 (!) 318 lb 1.9 oz (144.3 kg)  03/25/19 (!) 312 lb 3.2 oz (141.6 kg)  02/01/19 (!) 315 lb (142.9 kg)     GEN: Obese, middle-aged gentleman, no acute distress HEENT: Normal  NECK: No JVD; No carotid bruits LYMPHATICS: No lymphadenopathy CARDIAC: RRR, no murmurs, rubs, gallops RESPIRATORY:  Clear to auscultation without rales, wheezing or rhonchi  ABDOMEN: Soft, non-tender, non-distended MUSCULOSKELETAL:  No edema; No deformity  SKIN: Warm and dry NEUROLOGIC:  Alert and oriented x 3 PSYCHIATRIC:  Normal  affect   ASSESSMENT:    1. Chronic systolic heart failure ([place])   2. Essential hypertension    PLAN:    In order of problems listed above:  1. 1.  History of chronic systolic congestive heart failure: Vernon Reed has been seen in the congestive heart failure clinic but has a normal ejection fraction at this point.  He has been compliant with his medications.  He watches his salt to some degree.  We will continue his same medications.  He would like to get rid of some of his medicines and I encouraged him to work on a diet, exercise, weight loss program.  If his blood pressure is normal then we probably would be able to be able to get rid of the Lasix and potassium.  Most of the other medicines will be needed to help preserve his left ventricular function.    Medication Adjustments/Labs and Tests Ordered: Current medicines are reviewed at length with the patient today.  Concerns regarding medicines are outlined above.  Orders Placed This Encounter  Procedures  . EKG 12-Lead   No orders of the defined types were placed in this encounter.   Patient Instructions  Medication Instructions:  Your physician recommends that you continue on your current medications as directed. Please refer to the Current Medication list given to you today.  If you need a refill on your cardiac medications before your next appointment, please call your pharmacy.   Lab work: None Ordered    Testing/Procedures: None Ordered   Follow-Up: At [facility], you and your health needs are our priority.  As part of our continuing mission to provide you with exceptional heart  care, we have created designated Provider Care Teams.  These Care Teams include your primary Cardiologist (physician) and Advanced Practice Providers (APPs -  Physician Assistants and Nurse Practitioners) who all work together to provide you with the care you need, when you need it. You will need a follow up appointment in:  6 months.  Please call our office 2 months in advance to schedule this appointment.  You may see Dr. Acie Fredrickson or one of the following Advanced Practice Providers on your designated Care Team: Richardson Dopp, PA-C [place], [place] . Daune Perch, NP     Signed, Mertie Moores, MD  04/14/2019 3:24 PM    [facility]

## 2019-04-14 NOTE — Patient Instructions (Signed)
Medication Instructions:  Your physician recommends that you continue on your current medications as directed. Please refer to the Current Medication list given to you today.  If you need a refill on your cardiac medications before your next appointment, please call your pharmacy.    Lab work: None Ordered    Testing/Procedures: None Ordered   Follow-Up: At [facility], you and your health needs are our priority.  As part of our continuing mission to provide you with exceptional heart care, we have created designated Provider Care Teams.  These Care Teams include your primary Cardiologist (physician) and Advanced Practice Providers (APPs -  Physician Assistants and Nurse Practitioners) who all work together to provide you with the care you need, when you need it. You will need a follow up appointment in:  6 months.  Please call our office 2 months in advance to schedule this appointment.  You may see Dr. Nahser or one of the following Advanced Practice Providers on your designated Care Team: Scott Weaver, PA-C Vin Bhagat, PA-C . Janine Hammond, NP   

## 2019-04-21 ENCOUNTER — Other Ambulatory Visit (HOSPITAL_COMMUNITY): Payer: Self-pay | Admitting: Cardiology

## 2019-05-02 ENCOUNTER — Other Ambulatory Visit (HOSPITAL_COMMUNITY): Payer: Self-pay | Admitting: Cardiology

## 2019-05-05 ENCOUNTER — Ambulatory Visit: Payer: BC Managed Care – PPO | Admitting: Adult Health

## 2019-05-05 ENCOUNTER — Other Ambulatory Visit: Payer: Self-pay

## 2019-05-05 ENCOUNTER — Other Ambulatory Visit (HOSPITAL_COMMUNITY)
Admission: RE | Admit: 2019-05-05 | Discharge: 2019-05-05 | Disposition: A | Discharge status: Home / Self Care | Payer: BC Managed Care – PPO | Source: Ambulatory Visit | Attending: Adult Health | Admitting: Adult Health

## 2019-05-05 ENCOUNTER — Encounter: Payer: Self-pay | Admitting: Adult Health

## 2019-05-05 VITALS — BP 130/100 | Temp 97.9°F | Wt 318.0 lb

## 2019-05-05 DIAGNOSIS — E1165 Type 2 diabetes mellitus with hyperglycemia: Secondary | ICD-10-CM | POA: Diagnosis not present

## 2019-05-05 DIAGNOSIS — Z113 Encounter for screening for infections with a predominantly sexual mode of transmission: Secondary | ICD-10-CM

## 2019-05-05 LAB — POCT GLYCOSYLATED HEMOGLOBIN (HGB A1C): HbA1c, POC (controlled diabetic range): 6.3 % (ref 0.0–7.0)

## 2019-05-05 NOTE — Progress Notes (Signed)
Subjective:    Patient ID: Vernon Reed, male    DOB: Nov 26, 1975, 43 y.o.   MRN: [id number]  HPI 43 year old male who is being evaluated today for 66-monthfollow-up regarding diabetes and obesity.  DM -he is currently prescribed metformin 500 mg  daily.  He does not check his blood sugars at home on a routine basis.  He denies symptoms of hypoglycemia.  Lab Results  Component Value Date   HGBA1C 6.7 (H) 02/01/2019   Obesity - He has not been working on weight loss. He continues to drink sodas and does not follow a diabetic diet. He understands he needs to lose weight   Wt Readings from Last 3 Encounters:  05/05/19 (!) 318 lb (144.2 kg)  04/14/19 (!) 318 lb 1.9 oz (144.3 kg)  03/25/19 (!) 312 lb 3.2 oz (141.6 kg)   STD screening - He would like to be tested for STD's. He had unprotected sex 2-3 weeks ago. He reports " a little leakage". Denies discolored discharge or burning with urination.   Review of Systems See HPI   Past Medical History:  Diagnosis Date  . AKI (acute kidney injury) ([place] 07/27/2013  . Cardiomyopathy ([place] 07/25/2013  . CHF (congestive heart failure) ([place]   . Chicken pox   . Chronic systolic heart failure ([place] 01/04/2014  . GERD (gastroesophageal reflux disease)   . Headache    "maybe 5 times/yr; nothing regular" (12/06/2014)  . Heart block 07/25/2013  . Hyperlipemia   . Hypertension   . Hypokalemia 07/26/2013  . Hypoxia 12/06/2014  . Obesity, morbid ([place] 12/27/2013  . OSA (obstructive sleep apnea)   . Sleep apnea   . SOB (shortness of breath) 07/23/2013  . Tachy-brady syndrome ([place] 07/26/2013  . Tachycardia 07/23/2013  . Type II diabetes mellitus ([place]     Social History   Socioeconomic History  . Marital status: Divorced    Spouse name: Not on file  . Number of children: 3  . Years of education: Not on file  . Highest education level: Not on file  Occupational History  . 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  Financial resource strain: Not on file  . Food insecurity    Worry: Not on file    Inability: Not on file  . Transportation needs    Medical: Not on file    Non-medical: Not on file  Tobacco Use  . Smoking status: Never Smoker  . Smokeless tobacco: Never Used  Substance and Sexual Activity  . Alcohol use: No    Comment: 12/06/2014 "might have a beer a couple times/month"  . Drug use: No  . Sexual activity: Yes  Lifestyle  . Physical activity    Days per week: Not on file    Minutes per session: Not on file  . Stress: Not on file  Relationships  . Social c[place]bal[place] phone: Not on file    Gets together: Not on file    Attends religious service: Not on file    Active member of club or organization: Not on file    Attends meetings of clubs or organizations: Not on file    Relationship status: Not on file  . Intimate partner violence    Fear of current or ex partner: Not on file    Emotionally abused: Not on file    Physically abused: Not on file    Forced sexual activity: Not on file  Other Topics Concern  . 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        Past Surgical History:  Procedure Laterality Date  . 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 APPENDECTOMY  ~ 1989  . CARDIAC CATHETERIZATION  07/2013  . LEFT AND RIGHT HEART CATHETERIZATION WITH CORONARY ANGIOGRAM N/A 07/26/2013   Procedure: LEFT AND RIGHT HEART CATHETERIZATION WITH CORONARY ANGIOGRAM;  Surgeon: Leonie Man, MD;  Location: [facility] C[facility] LAB;  Service: Cardiovascular;  Laterality: N/A;    Family History  Problem Relation Age of Onset  . Hypertension Mother   . Hypertension Father   . Sleep apnea Father     No Known Allergies  Current Outpatient Medications on File Prior to Visit  Medication Sig Dispense Refill  . aspirin 81 MG EC tablet Take 1 tablet (81 mg total) by mouth daily. 30 tablet 6  . Blood  Glucose Monitoring Suppl (TRUE METRIX METER) W/DEVICE KIT 1 each by Does not apply route once as needed. 1 kit 0  . carvedilol (COREG) 12.5 MG tablet TAKE 1 1/2 TABLET BY MOUTH 2 TIMES DAILY WITH A MEAL 90 tablet 0  . Cholecalciferol (VITAMIN D PO) Take 1,000 Units by mouth daily.     Marland Kitchen ENTRESTO 97-103 MG TAKE 1 TABLET BY MOUTH 2 (TWO) TIMES DAILY. NEED FOLLOW UP APPT FOR FUTURE REFILLS [phone] 60 tablet 0  . furosemide (LASIX) 40 MG tablet TAKE 1 TABLET (40 MG TOTAL) BY MOUTH DAILY AS NEEDED. 90 tablet 0  . glucose blood (TRUE METRIX BLOOD GLUCOSE TEST) test strip Used 3 times daily before meals. 100 each 12  . hydrALAZINE (APRESOLINE) 50 MG tablet TAKE 1&1/2 TABLET 3 TIMES DAILY 135 tablet 5  . isosorbide mononitrate (IMDUR) 30 MG 24 hr tablet TAKE 1 TABLET (30 MG TOTAL) BY MOUTH DAILY. NEEDS APPOINTMENT 30 tablet 0  . potassium chloride (K-DUR) 10 MEQ tablet TAKE 1 TABLET BY MOUTH EVERY DAY 30 tablet 2  . spironolactone (ALDACTONE) 25 MG tablet TAKE 1 TABLET BY MOUTH EVERY DAY 30 tablet 3  . TRUEPLUS LANCETS 30G MISC 1 each by Does not apply route every morning. 50 each 11  . metFORMIN (GLUCOPHAGE) 500 MG tablet Take 1 tablet (500 mg total) by mouth 2 (two) times daily with a meal. 180 tablet 0   No current facility-administered medications on file prior to visit.     BP (!) 130/100   Temp 97.9 F (36.6 C)   Wt (!) 318 lb (144.2 kg)   BMI 43.13 kg/m       Objective:   Physical Exam Vitals signs and nursing note reviewed.  Constitutional:      Appearance: Normal appearance. He is obese.  Cardiovascular:     Rate and Rhythm: Normal rate and regular rhythm.     Pulses: Normal pulses.     Heart sounds: Normal heart sounds.  Pulmonary:     Effort: Pulmonary effort is normal.     Breath sounds: Normal breath sounds.  Genitourinary:    Penis: Normal.   Musculoskeletal: Normal range of motion.  Skin:    General: Skin is warm and dry.  Neurological:     General: No focal  deficit present.     Mental Status: He is alert.        Assessment & Plan:  1. Type 2 diabetes mellitus with hyperglycemia, unspecified whether  long term insulin use (HCC)  - POCT A1C- 6.3  - Has improved - Continue with Metformin 500 mg daily  - Follow up in 3 months   2. Screen for STD (sexually transmitted disease) - Encouraged safe sex practice. Will treat if needed - Urine cytology ancillary only  3. Obesity, morbid ([place]) - we discussed healthy weight loss clinic  - He would like to try lifestyle modifications first    Dorothyann Peng, NP

## 2019-05-06 ENCOUNTER — Other Ambulatory Visit (HOSPITAL_COMMUNITY): Payer: Self-pay | Admitting: Cardiology

## 2019-05-10 ENCOUNTER — Encounter: Payer: Self-pay | Admitting: Adult Health

## 2019-05-11 ENCOUNTER — Other Ambulatory Visit (HOSPITAL_COMMUNITY): Payer: Self-pay | Admitting: Cardiology

## 2019-05-11 ENCOUNTER — Other Ambulatory Visit: Payer: Self-pay | Admitting: Cardiology

## 2019-05-11 DIAGNOSIS — I5022 Chronic systolic (congestive) heart failure: Secondary | ICD-10-CM

## 2019-05-11 DIAGNOSIS — I509 Heart failure, unspecified: Secondary | ICD-10-CM

## 2019-05-11 DIAGNOSIS — I10 Essential (primary) hypertension: Secondary | ICD-10-CM

## 2019-05-11 LAB — URINE CYTOLOGY ANCILLARY ONLY
Chlamydia: NEGATIVE
Comment: NEGATIVE
Comment: NEGATIVE
Comment: NORMAL
Neisseria Gonorrhea: NEGATIVE
Trichomonas: NEGATIVE

## 2019-05-13 ENCOUNTER — Other Ambulatory Visit (HOSPITAL_COMMUNITY): Payer: Self-pay

## 2019-05-13 DIAGNOSIS — I509 Heart failure, unspecified: Secondary | ICD-10-CM

## 2019-05-13 MED ORDER — SPIRONOLACTONE 25 MG PO TABS: 25.0000 mg | ORAL_TABLET | Freq: Every day | ORAL | 3 refills | Status: DC

## 2019-06-19 ENCOUNTER — Other Ambulatory Visit: Payer: Self-pay | Admitting: Adult Health

## 2019-06-19 DIAGNOSIS — E1165 Type 2 diabetes mellitus with hyperglycemia: Secondary | ICD-10-CM

## 2019-06-22 NOTE — Telephone Encounter (Signed)
Ok to refill for 30 days. Then he is due for follow up. He is only taking one a day

## 2019-06-22 NOTE — Telephone Encounter (Signed)
Assessment & Plan:  1. Type 2 diabetes mellitus with hyperglycemia, unspecified whether long term insulin use (HCC)  - POCT A1C- 6.3  - Has improved - Continue with Metformin 500 mg daily  - Follow up in 3 months    PT IS ONLY TAKING 1 TAB DAILY?

## 2019-06-22 NOTE — Telephone Encounter (Signed)
SENT TO THE PHARMACY BY E-SCRIBE. 

## 2019-06-27 ENCOUNTER — Other Ambulatory Visit (HOSPITAL_COMMUNITY): Payer: Self-pay | Admitting: Cardiology

## 2019-07-09 ENCOUNTER — Other Ambulatory Visit (HOSPITAL_COMMUNITY): Payer: Self-pay | Admitting: Adult Health

## 2019-07-11 ENCOUNTER — Other Ambulatory Visit: Payer: Self-pay | Admitting: Adult Health

## 2019-07-11 DIAGNOSIS — E1165 Type 2 diabetes mellitus with hyperglycemia: Secondary | ICD-10-CM

## 2019-07-15 ENCOUNTER — Telehealth (HOSPITAL_COMMUNITY): Payer: Self-pay | Admitting: Pharmacy Technician

## 2019-07-15 NOTE — Telephone Encounter (Signed)
Patient Advocate Encounter   Received notification from [facility] that prior authorization for Sherryll Burger is required.   PA submitted on CoverMyMeds Key [id number] Status is pending   Will continue to follow.  Archer Asa, CPhT

## 2019-07-18 NOTE — Telephone Encounter (Signed)
Advanced Heart Failure Patient Advocate Encounter  Prior Authorization for Sherryll Burger has been approved.    Effective dates: 07/15/2019 through 07/14/2020  Patients co-pay is $0.00  Archer Asa, CPhT

## 2019-07-24 ENCOUNTER — Encounter (HOSPITAL_BASED_OUTPATIENT_CLINIC_OR_DEPARTMENT_OTHER): Payer: Self-pay | Admitting: Emergency Medicine

## 2019-07-24 ENCOUNTER — Emergency Department (HOSPITAL_BASED_OUTPATIENT_CLINIC_OR_DEPARTMENT_OTHER)
Admission: EM | Admit: 2019-07-24 | Discharge: 2019-07-24 | Disposition: A | Discharge status: Home / Self Care | Payer: BC Managed Care – PPO | Attending: Emergency Medicine | Admitting: Emergency Medicine

## 2019-07-24 ENCOUNTER — Other Ambulatory Visit: Payer: Self-pay

## 2019-07-24 DIAGNOSIS — I110 Hypertensive heart disease with heart failure: Secondary | ICD-10-CM | POA: Diagnosis not present

## 2019-07-24 DIAGNOSIS — Z7982 Long term (current) use of aspirin: Secondary | ICD-10-CM | POA: Diagnosis not present

## 2019-07-24 DIAGNOSIS — I5022 Chronic systolic (congestive) heart failure: Secondary | ICD-10-CM | POA: Diagnosis not present

## 2019-07-24 DIAGNOSIS — Z7984 Long term (current) use of oral hypoglycemic drugs: Secondary | ICD-10-CM | POA: Insufficient documentation

## 2019-07-24 DIAGNOSIS — Z79899 Other long term (current) drug therapy: Secondary | ICD-10-CM | POA: Diagnosis not present

## 2019-07-24 DIAGNOSIS — R600 Localized edema: Secondary | ICD-10-CM | POA: Diagnosis not present

## 2019-07-24 DIAGNOSIS — M79674 Pain in right toe(s): Secondary | ICD-10-CM | POA: Insufficient documentation

## 2019-07-24 DIAGNOSIS — E119 Type 2 diabetes mellitus without complications: Secondary | ICD-10-CM | POA: Diagnosis not present

## 2019-07-24 MED ORDER — COLCHICINE 0.6 MG PO TABS: 0.6000 mg | ORAL_TABLET | Freq: Two times a day (BID) | ORAL | 0 refills | Status: DC

## 2019-07-24 MED ORDER — KETOROLAC TROMETHAMINE 15 MG/ML IJ SOLN
15.0000 mg | Freq: Once | INTRAMUSCULAR | Status: AC
  Administered 2019-07-24: 15 mg via INTRAMUSCULAR
  Filled 2019-07-24: qty 1

## 2019-07-24 NOTE — ED Provider Notes (Signed)
[facility] EMERGENCY DEPARTMENT Provider Note   CSN: [id number] Arrival date & time: 07/24/19  1446     History Chief Complaint  Patient presents with  . Toe Pain    Vernon Reed is a 44 y.o. male presenting for evaluation of right great toe pain.  Patient states for the past 2 days, he has been having right great toe pain.  This occurred sudden, there is no trauma or injury.  No precipitating event.  Patient states pain is gradually worsened over the past 2 days.  It is constant, worse with any movement or weightbearing.  He has been taking Tylenol without improvement of symptoms, has not taken anything else.  He denies previous history of gout, but states his dad has gout.  He has changed his diet recently to eat fewer carbs and greasy foods.  No recent medication changes.  He is on multiple medications for CHF/hypertension.  He denies fevers, chills, nausea, vomiting, weakness.  He denies pain or symptoms of the left foot.  No numbness.  Pain does not radiate anywhere.  Additional history obtained from chart review.  Patient with a history of CHF, GERD, hypertension, hyperlipidemia, OSA, tachybradycardia syndrome, diabetes   HPI     Past Medical History:  Diagnosis Date  . AKI (acute kidney injury) ([place]) 07/27/2013  . Cardiomyopathy ([place]) 07/25/2013  . CHF (congestive heart failure) ([place])   . Chicken pox   . Chronic systolic heart failure ([place]) 01/04/2014  . GERD (gastroesophageal reflux disease)   . Headache    "maybe 5 times/yr; nothing regular" (12/06/2014)  . Heart block 07/25/2013  . Hyperlipemia   . Hypertension   . Hypokalemia 07/26/2013  . Hypoxia 12/06/2014  . Obesity, morbid ([place]) 12/27/2013  . OSA (obstructive sleep apnea)   . Sleep apnea   . SOB (shortness of breath) 07/23/2013  . Tachy-brady syndrome ([place]) 07/26/2013  . Tachycardia 07/23/2013  . Type II diabetes mellitus [facility])     Patient Active Problem List   Diagnosis Date Noted  . Type II diabetes  mellitus ([place]) 12/11/2014  . Hypoxia 12/06/2014  . OSA (obstructive sleep apnea) 01/10/2014  . Chronic systolic heart failure ([place]) 01/04/2014  . Obesity, morbid ([place]) 12/27/2013  . Tachy-brady syndrome ([place]) 07/26/2013  . Cardiomyopathy ([place]) 07/25/2013  . Heart block 07/25/2013  . Hypertension 07/23/2013    Past Surgical History:  Procedure Laterality Date  . 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 APPENDECTOMY  ~ 1989  . CARDIAC CATHETERIZATION  07/2013  . LEFT AND RIGHT HEART CATHETERIZATION WITH CORONARY ANGIOGRAM N/A 07/26/2013   Procedure: LEFT AND RIGHT HEART CATHETERIZATION WITH CORONARY ANGIOGRAM;  Surgeon: Leonie Man, MD;  Location: [facility] CATH LAB;  Service: Cardiovascular;  Laterality: N/A;       Family History  Problem Relation Age of Onset  . Hypertension Mother   . Hypertension Father   . Sleep apnea Father     Social History   Tobacco Use  . Smoking status: Never Smoker  . Smokeless tobacco: Never Used  Substance Use Topics  . Alcohol use: No    Comment: 12/06/2014 "might have a beer a couple times/month"  . Drug use: No    Home Medications Prior to Admission medications   Medication Sig Start Date End Date Taking? Authorizing Provider  aspirin 81 MG EC tablet Take 1 tablet (81 mg total) by mouth daily. 07/16/15   Larey Dresser, MD  Blood  Glucose Monitoring Suppl (TRUE METRIX METER) W/DEVICE KIT 1 each by Does not apply route once as needed. 12/11/14   Charlott Rakes, MD  carvedilol (COREG) 12.5 MG tablet TAKE 1 1/2 TABLET BY MOUTH 2 TIMES DAILY WITH A MEAL 07/11/19   Clegg, Amy D, NP  Cholecalciferol (VITAMIN D PO) Take 1,000 Units by mouth daily.     [provider]  colchicine 0.6 MG tablet Take 1 tablet (0.6 mg total) by mouth 2 (two) times daily. Take 2 times a day as tolerated. If you have diarrhea or other stomach symptoms, decreased to 1 time a day. 07/24/19   Lillion Elbert, PA-C  ENTRESTO 97-103 MG TAKE 1  TABLET BY MOUTH 2 (TWO) TIMES DAILY. NEED FOLLOW UP APPT FOR FUTURE REFILLS [phone] 06/27/19   Larey Dresser, MD  furosemide (LASIX) 40 MG tablet TAKE 1 TABLET BY MOUTH DAILY AS NEEDED 05/06/19   Larey Dresser, MD  glucose blood (TRUE METRIX BLOOD GLUCOSE TEST) test strip Used 3 times daily before meals. 04/08/16   Charlott Rakes, MD  hydrALAZINE (APRESOLINE) 50 MG tablet TAKE 1&1/2 TABLET 3 TIMES DAILY 05/02/19   Larey Dresser, MD  isosorbide mononitrate (IMDUR) 30 MG 24 hr tablet TAKE 1 TABLET (30 MG TOTAL) BY MOUTH DAILY. NEEDS APPOINTMENT 05/11/19   Larey Dresser, MD  metFORMIN (GLUCOPHAGE) 500 MG tablet TAKE 1 TABLET BY MOUTH TWICE A DAY WITH FOOD 07/12/19   Nafziger, Tommi Rumps, NP  potassium chloride (KLOR-CON) 10 MEQ tablet TAKE 1 TABLET BY MOUTH EVERY DAY 05/11/19   Larey Dresser, MD  spironolactone (ALDACTONE) 25 MG tablet Take 1 tablet (25 mg total) by mouth daily. 05/13/19   Bensimhon, Shaune Pascal, MD  TRUEPLUS LANCETS 30G MISC 1 each by Does not apply route every morning. 12/11/14   Charlott Rakes, MD    Allergies    Patient has no known allergies.  Review of Systems   Review of Systems  Musculoskeletal: Positive for arthralgias and joint swelling.  Neurological: Negative for numbness.  Hematological: Does not bruise/bleed easily.    Physical Exam Updated Vital Signs BP 132/89 (BP Location: Right Arm)   Pulse 73   Temp 99.6 F (37.6 C) (Oral)   Resp 18   Ht 6' (1.829 m)   Wt 133.8 kg   SpO2 97%   BMI 40.01 kg/m   Physical Exam Vitals and nursing note reviewed.  Constitutional:      General: He is not in acute distress.    Appearance: He is well-developed.     Comments: Sitting in the bed in NAD  HENT:     Head: Normocephalic and atraumatic.  Pulmonary:     Effort: Pulmonary effort is normal.  Abdominal:     General: There is no distension.  Musculoskeletal:        General: Normal range of motion.     Cervical back: Normal range of motion.        Feet:  Feet:     Comments: Tenderness palpation of the right first MTP.  Mild swelling, no significant erythema or warmth.  No tenderness palpation elsewhere in the foot including the distal great toe or lateral foot.  Full active range of motion the ankle without difficulty.  Pedal pulse 2+ bilaterally.  No laceration or sign of trauma. Skin:    General: Skin is warm.     Capillary Refill: Capillary refill takes less than 2 seconds.     Findings: No rash.  Neurological:  Mental Status: He is alert and oriented to person, place, and time.     ED Results / Procedures / Treatments   Labs (all labs ordered are listed, but only abnormal results are displayed) Labs Reviewed - No data to display  EKG None  Radiology No results found.  Procedures Procedures (including critical care time)  Medications Ordered in ED Medications  ketorolac (TORADOL) 15 MG/ML injection 15 mg (15 mg Intramuscular Given 07/24/19 1556)    ED Course  I have reviewed the triage vital signs and the nursing notes.  Pertinent labs & imaging results that were available during my care of the patient were reviewed by me and considered in my medical decision making (see chart for details).    MDM Rules/Calculators/A&P                      Patient presenting for evaluation of right great toe pain.  Physical exam reassuring, he is neurovascularly intact.  Mild swelling and tenderness to palpation.  No warmth or erythema, or sign of injury/cut.  Low suspicion for septic joint.  Likely gout, as patient is on multiple CHF and hypertension medications that can trigger gout.  Discussed option of treatment with anti-inflammatories including prednisone, and my concern for hyperglycemia.  Seen in my concern for GI symptoms.  Patient would like to try colchicine, most recent SCr normal. Will also have patient treat pain with ibuprofen.  Discussed importance of dietary management.  Encourage follow-up with primary care for  recheck of symptoms.  At this time, patient appears safe for discharge.  Return precautions given.  Patient states he understands and agrees to plan.  Final Clinical Impression(s) / ED Diagnoses Final diagnoses:  Great toe pain, right    Rx / DC Orders ED Discharge Orders         Ordered    colchicine 0.6 MG tablet  2 times daily     07/24/19 1541           Darious Rehman, PA-C 07/24/19 1608    Quintella Reichert, MD 07/24/19 2358

## 2019-07-24 NOTE — Discharge Instructions (Signed)
Take 600 mg ibuprofen 3 times a day with meals.  Take colchicine as prescribed until symptoms improve.  Be careful with your diet.  There is information about this in the paperwork. Use ice to help with pain and swelling. Follow-up with your primary care doctor for recheck of your symptoms.  Return to the emergency room with any new, worsening, or concerning symptoms.

## 2019-07-24 NOTE — ED Triage Notes (Signed)
R great toe pain for a few days. Concerned for gout.

## 2019-08-02 ENCOUNTER — Other Ambulatory Visit (HOSPITAL_COMMUNITY): Payer: Self-pay

## 2019-08-02 MED ORDER — FUROSEMIDE 40 MG PO TABS: 40.0000 mg | ORAL_TABLET | Freq: Every day | ORAL | 1 refills | Status: DC | PRN

## 2019-08-11 ENCOUNTER — Ambulatory Visit: Payer: BC Managed Care – PPO | Admitting: Family Medicine

## 2019-08-11 ENCOUNTER — Encounter: Payer: Self-pay | Admitting: Family Medicine

## 2019-08-11 ENCOUNTER — Other Ambulatory Visit: Payer: Self-pay

## 2019-08-11 VITALS — BP 120/70 | HR 85 | Temp 98.0°F | Wt 301.6 lb

## 2019-08-11 DIAGNOSIS — M109 Gout, unspecified: Secondary | ICD-10-CM | POA: Diagnosis not present

## 2019-08-11 MED ORDER — METHYLPREDNISOLONE ACETATE 40 MG/ML IJ SUSP
40.0000 mg | Freq: Once | INTRAMUSCULAR | Status: AC
  Administered 2019-08-11: 40 mg via INTRAMUSCULAR

## 2019-08-11 MED ORDER — METHYLPREDNISOLONE ACETATE 80 MG/ML IJ SUSP
80.0000 mg | Freq: Once | INTRAMUSCULAR | Status: AC
  Administered 2019-08-11: 80 mg via INTRAMUSCULAR

## 2019-08-11 MED ORDER — COLCHICINE 0.6 MG PO TABS: 0.6000 mg | ORAL_TABLET | Freq: Four times a day (QID) | ORAL | 0 refills | Status: DC | PRN

## 2019-08-11 NOTE — Progress Notes (Signed)
   Subjective:    Patient ID: Vernon Reed, male    DOB: [birth year], 44 y.o.   MRN: [id number]  HPI Here to follow up on an ER visit on 07-24-19 for swelling and pain in the right great toe. He had never had this before. His father has gout. No recent trauma hx. It was felt to be gout, so he was given a Toradol shot and several days of Colchicine. This helped for a few days but then the pain returned.    Review of Systems  Constitutional: Negative.   Respiratory: Negative.   Cardiovascular: Negative.   Musculoskeletal: Positive for arthralgias and joint swelling.       Objective:   Physical Exam Constitutional:      Appearance: Normal appearance.  Cardiovascular:     Rate and Rhythm: Normal rate and regular rhythm.     Pulses: Normal pulses.     Heart sounds: Normal heart sounds.  Pulmonary:     Effort: Pulmonary effort is normal.     Breath sounds: Normal breath sounds.  Musculoskeletal:     Comments: The MTP joint of the right great toe is swollen and quite tender. Not red or warm.  Neurological:     Mental Status: He is alert.           Assessment & Plan:  Gout. Given a shot of DepoMedrol. Follow with Colchicine as needed.  Gershon Crane, MD

## 2019-08-11 NOTE — Addendum Note (Signed)
Addended by: Solon Augusta on: 08/11/2019 11:29 AM   Modules accepted: Orders

## 2019-08-14 ENCOUNTER — Other Ambulatory Visit (HOSPITAL_COMMUNITY): Payer: Self-pay | Admitting: Internal Medicine

## 2019-08-14 DIAGNOSIS — I509 Heart failure, unspecified: Secondary | ICD-10-CM

## 2019-08-18 ENCOUNTER — Ambulatory Visit: Payer: BC Managed Care – PPO | Attending: Family

## 2019-08-18 DIAGNOSIS — Z23 Encounter for immunization: Secondary | ICD-10-CM | POA: Insufficient documentation

## 2019-08-18 NOTE — Progress Notes (Signed)
° °  Covid-19 Vaccination Clinic  Name:  Jivan Symanski    MRN: [id number] DOB: 05/11/1976  08/18/2019  Mr. Artz was observed post Covid-19 immunization for 15 minutes without incidence. He was provided with Vaccine Information Sheet and instruction to access the V-Safe system.   Mr. Harshfield was instructed to call 911 with any severe reactions post vaccine:  Difficulty breathing   Swelling of your face and throat   A fast heartbeat   A bad rash all over your body   Dizziness and weakness    Immunizations Administered    Name Date Dose VIS Date Route   Moderna COVID-19 Vaccine 08/18/2019  5:22 PM 0.5 mL 06/07/2019 Intramuscular   Manufacturer: Moderna   Lot: [id number]   NDC: 82641-583-09

## 2019-08-26 ENCOUNTER — Telehealth: Payer: Self-pay | Admitting: Adult Health

## 2019-08-26 NOTE — Telephone Encounter (Signed)
FYI the patient  Vernon Reed wanted to inform Denyse Amass that he tested positive for Covid 19. He just wanted to inform him of this

## 2019-08-29 ENCOUNTER — Encounter: Payer: Self-pay | Admitting: Adult Health

## 2019-08-29 NOTE — Telephone Encounter (Signed)
Called pt to see if he could upload positive result via Mychart. Pt verbalized understanding.

## 2019-08-31 ENCOUNTER — Other Ambulatory Visit (HOSPITAL_COMMUNITY): Payer: Self-pay | Admitting: Cardiology

## 2019-09-20 ENCOUNTER — Ambulatory Visit: Payer: BC Managed Care – PPO | Attending: Family

## 2019-09-20 DIAGNOSIS — Z23 Encounter for immunization: Secondary | ICD-10-CM

## 2019-09-20 NOTE — Progress Notes (Signed)
   Covid-19 Vaccination Clinic  Name:  Harden Bramer    MRN: [id number] DOB: 1976/06/30  09/20/2019  Mr. Paules was observed post Covid-19 immunization for 15 minutes without incident. He was provided with Vaccine Information Sheet and instruction to access the V-Safe system.   Mr. Mahler was instructed to call 911 with any severe reactions post vaccine: Marland Kitchen Difficulty breathing  . Swelling of face and throat  . A fast heartbeat  . A bad rash all over body  . Dizziness and weakness   Immunizations Administered    Name Date Dose VIS Date Route   Moderna COVID-19 Vaccine 09/20/2019  1:48 PM 0.5 mL 06/07/2019 Intramuscular   Manufacturer: Moderna   Lot: [id number]   NDC: 30746-002-98

## 2019-09-30 ENCOUNTER — Other Ambulatory Visit (HOSPITAL_COMMUNITY): Payer: Self-pay | Admitting: Adult Health

## 2019-11-02 ENCOUNTER — Ambulatory Visit: Payer: BC Managed Care – PPO | Admitting: Adult Health

## 2019-11-02 ENCOUNTER — Other Ambulatory Visit: Payer: Self-pay

## 2019-11-03 ENCOUNTER — Ambulatory Visit (INDEPENDENT_AMBULATORY_CARE_PROVIDER_SITE_OTHER): Payer: BC Managed Care – PPO | Admitting: Adult Health

## 2019-11-03 ENCOUNTER — Encounter: Payer: Self-pay | Admitting: Adult Health

## 2019-11-03 VITALS — BP 122/86 | Temp 98.1°F | Wt 299.0 lb

## 2019-11-03 DIAGNOSIS — E1169 Type 2 diabetes mellitus with other specified complication: Secondary | ICD-10-CM

## 2019-11-03 DIAGNOSIS — T148XXA Other injury of unspecified body region, initial encounter: Secondary | ICD-10-CM | POA: Diagnosis not present

## 2019-11-03 DIAGNOSIS — S29012A Strain of muscle and tendon of back wall of thorax, initial encounter: Secondary | ICD-10-CM | POA: Diagnosis not present

## 2019-11-03 LAB — POCT GLYCOSYLATED HEMOGLOBIN (HGB A1C): HbA1c, POC (controlled diabetic range): 5.6 % (ref 0.0–7.0)

## 2019-11-03 MED ORDER — METHYLPREDNISOLONE 4 MG PO TBPK: ORAL_TABLET | ORAL | 0 refills | Status: DC

## 2019-11-03 MED ORDER — BACLOFEN 10 MG PO TABS: 10.0000 mg | ORAL_TABLET | Freq: Every day | ORAL | 0 refills | Status: DC

## 2019-11-03 NOTE — Patient Instructions (Signed)
I have sent in a prescription for prednisone and a muscle relaxer   Please follow up for your physical after July 28th

## 2019-11-03 NOTE — Progress Notes (Signed)
Subjective:    Patient ID: Vernon Reed, male    DOB: Aug 02, 1975, 44 y.o.   MRN: [id number]  HPI 44 year old male who  has a past medical history of AKI (acute kidney injury) ([place]) (07/27/2013), Cardiomyopathy ([place]) (07/25/2013), CHF (congestive heart failure) ([place]), Chicken pox, Chronic systolic heart failure ([place]) (01/04/2014), GERD (gastroesophageal reflux disease), Headache, Heart block (07/25/2013), Hyperlipemia, Hypertension, Hypokalemia (07/26/2013), Hypoxia (12/06/2014), Obesity, morbid ([place]) (12/27/2013), OSA (obstructive sleep apnea), Sleep apnea, SOB (shortness of breath) (07/23/2013), Tachy-brady syndrome ([place]) (07/26/2013), Tachycardia (07/23/2013), and Type II diabetes mellitus ([place]).  He presents to the office today for an acute issue of right sided upper  back pain that started about two weeks ago after going to a boxing class.  The pain is located under his right shoulder blade and radiates around to the right flank.  He reports that the pain has been intermittent and notices more with certain range of motions sitting for extended periods of time.  He was seen at the student health clinic at [employer] this week and was prescribed Flexeril and Motrin.  He reports that the Flexeril just made him sleepy and did not do much to help with the muscle pain.  Additionally, he is overdue for his follow-up on diabetes mellitus.  His last A1c was done in October 2020 at which time was 6.3.  He has been actively working on weight loss since that time, going to the gym, eating healthy, and has been able to lose approximately 19 pounds.  He has not been checking his blood sugars at home but denies symptoms of hypoglycemia.  He is taking Metformin 500 mg daily Wt Readings from Last 5 Encounters:  11/03/19 299 lb (135.6 kg)  08/11/19 (!) 301 lb 9.6 oz (136.8 kg)  07/24/19 295 lb (133.8 kg)  05/05/19 (!) 318 lb (144.2 kg)  04/14/19 (!) 318 lb 1.9 oz (144.3 kg)     Review of Systems Se3 HPI   Past Medical  History:  Diagnosis Date  . AKI (acute kidney injury) ([place]) 07/27/2013  . Cardiomyopathy ([place]) 07/25/2013  . CHF (congestive heart failure) ([place])   . Chicken pox   . Chronic systolic heart failure ([place]) 01/04/2014  . GERD (gastroesophageal reflux disease)   . Headache    "maybe 5 times/yr; nothing regular" (12/06/2014)  . Heart block 07/25/2013  . Hyperlipemia   . Hypertension   . Hypokalemia 07/26/2013  . Hypoxia 12/06/2014  . Obesity, morbid ([place]) 12/27/2013  . OSA (obstructive sleep apnea)   . Sleep apnea   . SOB (shortness of breath) 07/23/2013  . Tachy-brady syndrome ([place]) 07/26/2013  . Tachycardia 07/23/2013  . Type II diabetes mellitus ([place])     Social History   Socioeconomic History  . Marital status: Divorced    Spouse name: Not on file  . Number of children: 3  . Years of education: Not on file  . Highest education level: Not on file  Occupational History  . Occupation: Herbalist: [employer]  Tobacco Use  . Smoking status: Never Smoker  . Smokeless tobacco: Never Used  Substance and Sexual Activity  . Alcohol use: No    Comment: 12/06/2014 "might have a beer a couple times/month"  . Drug use: No  . Sexual activity: Yes  Other Topics Concern  . 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       Social Determinants of  Health   Financial Resource Strain:   . Difficulty of Paying Living Expenses:   Food Insecurity:   . Worried About Charity fundraiser in the Last Year:   . Arboriculturist in the Last Year:   Transportation Needs:   . Film/video editor (Medical):   Marland Kitchen Lack of Transportation (Non-Medical):   Physical Activity:   . Days of Exercise per Week:   . Minutes of Exercise per Session:   Stress:   . Feeling of Stress :   Social Connections:   . Frequency of Communication with Friends and Family:   . Frequency of Social Gatherings with Friends and Family:   . Attends Religious  Services:   . Active Member of Clubs or Organizations:   . Attends Archivist Meetings:   Marland Kitchen Marital Status:   Intimate Partner Violence:   . Fear of Current or Ex-Partner:   . Emotionally Abused:   Marland Kitchen Physically Abused:   . Sexually Abused:     Past Surgical History:  Procedure Laterality Date  . 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 APPENDECTOMY  ~ 1989  . CARDIAC CATHETERIZATION  07/2013  . LEFT AND RIGHT HEART CATHETERIZATION WITH CORONARY ANGIOGRAM N/A 07/26/2013   Procedure: LEFT AND RIGHT HEART CATHETERIZATION WITH CORONARY ANGIOGRAM;  Surgeon: Leonie Man, MD;  Location: [facility] CATH LAB;  Service: Cardiovascular;  Laterality: N/A;    Family History  Problem Relation Age of Onset  . Hypertension Mother   . Hypertension Father   . Sleep apnea Father     No Known Allergies  Current Outpatient Medications on File Prior to Visit  Medication Sig Dispense Refill  . aspirin 81 MG EC tablet Take 1 tablet (81 mg total) by mouth daily. 30 tablet 6  . Blood Glucose Monitoring Suppl (TRUE METRIX METER) W/DEVICE KIT 1 each by Does not apply route once as needed. 1 kit 0  . carvedilol (COREG) 12.5 MG tablet Take 1.5 tablets (18.75 mg total) by mouth 2 (two) times daily with a meal. 270 tablet 3  . colchicine 0.6 MG tablet Take 1 tablet (0.6 mg total) by mouth every 6 (six) hours as needed (gout pain). Take 2 times a day as tolerated. If you have diarrhea or other stomach symptoms, decreased to 1 time a day. 60 tablet 0  . cyclobenzaprine (FLEXERIL) 10 MG tablet Take 10 mg by mouth at bedtime.    Marland Kitchen ENTRESTO 97-103 MG TAKE 1 TABLET BY MOUTH 2 (TWO) TIMES DAILY. NEED FOLLOW UP APPT FOR FUTURE REFILLS [phone] 60 tablet 0  . furosemide (LASIX) 40 MG tablet Take 1 tablet (40 mg total) by mouth daily as needed. 90 tablet 1  . glucose blood (TRUE METRIX BLOOD GLUCOSE TEST) test strip Used 3 times daily before meals. 100 each 12  . hydrALAZINE  (APRESOLINE) 50 MG tablet TAKE 1&1/2 TABLET 3 TIMES DAILY 135 tablet 5  . ibuprofen (ADVIL) 600 MG tablet Take 600 mg by mouth 3 (three) times daily.    . isosorbide mononitrate (IMDUR) 30 MG 24 hr tablet TAKE 1 TABLET (30 MG TOTAL) BY MOUTH DAILY. NEEDS APPOINTMENT 90 tablet 3  . levocetirizine (XYZAL) 5 MG tablet Take 5 mg by mouth at bedtime as needed.    . metFORMIN (GLUCOPHAGE) 500 MG tablet TAKE 1 TABLET BY MOUTH TWICE A DAY WITH FOOD 180 tablet 1  . potassium chloride (KLOR-CON) 10 MEQ tablet TAKE 1  TABLET BY MOUTH EVERY DAY 90 tablet 3  . spironolactone (ALDACTONE) 25 MG tablet TAKE 1 TABLET BY MOUTH EVERY DAY 90 tablet 1  . TRUEPLUS LANCETS 30G MISC 1 each by Does not apply route every morning. 50 each 11   No current facility-administered medications on file prior to visit.    BP 122/86   Temp 98.1 F (36.7 C)   Wt 299 lb (135.6 kg)   BMI 40.55 kg/m       Objective:   Physical Exam Vitals and nursing note reviewed.  Constitutional:      Appearance: Normal appearance. He is obese.  Musculoskeletal:        General: Tenderness present. Normal range of motion.     Comments: Narrowness with palpation throughout right trapezius and right latissimus dorsi muscle groups.  There is no loss of range of motion  Skin:    General: Skin is warm and dry.     Capillary Refill: Capillary refill takes less than 2 seconds.  Neurological:     General: No focal deficit present.     Mental Status: He is alert and oriented to person, place, and time.  Psychiatric:        Mood and Affect: Mood normal.        Behavior: Behavior normal.        Thought Content: Thought content normal.        Judgment: Judgment normal.        Assessment & Plan:  1. Muscle strain -Exam consistent with muscle strain.  Will prescribe baclofen and a Medrol Dosepak.  He was advised to use a heating pad as well and to perform gentle stretching exercises - methylPREDNISolone (MEDROL DOSEPAK) 4 MG TBPK tablet;  Take as directed  Dispense: 21 tablet; Refill: 0 - baclofen (LIORESAL) 10 MG tablet; Take 1 tablet (10 mg total) by mouth at bedtime.  Dispense: 10 each; Refill: 0  2. Type 2 diabetes mellitus with other specified complication, unspecified whether long term insulin use (HCC)  - POC HgB A1c- 5.6 -he continues to improve overall.  Encouraged to continue with weight loss.  We will keep him on Metformin 500 mg daily and have him follow-up in July for his CPE.  His A1c continues to be low and he is is continuing to lose weight we might be able to take him off Metformin  Dorothyann Peng, NP

## 2019-11-11 ENCOUNTER — Other Ambulatory Visit (HOSPITAL_COMMUNITY): Payer: Self-pay | Admitting: Cardiology

## 2019-12-21 ENCOUNTER — Encounter: Payer: Self-pay | Admitting: Adult Health

## 2019-12-21 ENCOUNTER — Ambulatory Visit (INDEPENDENT_AMBULATORY_CARE_PROVIDER_SITE_OTHER): Payer: BC Managed Care – PPO | Admitting: Adult Health

## 2019-12-21 ENCOUNTER — Other Ambulatory Visit: Payer: Self-pay

## 2019-12-21 ENCOUNTER — Other Ambulatory Visit (HOSPITAL_COMMUNITY)
Admission: RE | Admit: 2019-12-21 | Discharge: 2019-12-21 | Disposition: A | Discharge status: Home / Self Care | Payer: BC Managed Care – PPO | Source: Ambulatory Visit | Attending: Adult Health | Admitting: Adult Health

## 2019-12-21 VITALS — BP 130/90 | Temp 97.9°F | Wt 306.0 lb

## 2019-12-21 DIAGNOSIS — M549 Dorsalgia, unspecified: Secondary | ICD-10-CM | POA: Diagnosis not present

## 2019-12-21 DIAGNOSIS — Z113 Encounter for screening for infections with a predominantly sexual mode of transmission: Secondary | ICD-10-CM

## 2019-12-21 NOTE — Progress Notes (Signed)
Subjective:    Patient ID: Sterling Mondo, male    DOB: [birth year], 44 y.o.   MRN: [id number]  HPI 44 year old male who  has a past medical history of AKI (acute kidney injury) ([place]) (07/27/2013), Cardiomyopathy ([place]) (07/25/2013), CHF (congestive heart failure) ([place]), Chicken pox, Chronic systolic heart failure ([place]) (01/04/2014), GERD (gastroesophageal reflux disease), Headache, Heart block (07/25/2013), Hyperlipemia, Hypertension, Hypokalemia (07/26/2013), Hypoxia (12/06/2014), Obesity, morbid ([place]) (12/27/2013), OSA (obstructive sleep apnea), Sleep apnea, SOB (shortness of breath) (07/23/2013), Tachy-brady syndrome ([place]) (07/26/2013), Tachycardia (07/23/2013), and Type II diabetes mellitus ([place]).  He presents to the office today for continued upper back pain.  He was originally seen for this issue on 11/03/2019.  His symptoms started about 2 weeks prior after going to a boxing class.  The pain was located under his left shoulder blade and radiated around to the right flank.  He reported that the pain had been intermittent and noticed more with certain range of motions and sitting for extended periods of time.  He was prescribed a Medrol Dosepak and baclofen  He reports that the prednisone pak and baclofen helped very little. He continues to have pain located under his left shoulder and on his right flank. Pain is worse with standing and twisting.    He would also like to have an STD panel done to " make sure everything is good" He has a new partner. He denies symptoms     Review of Systems See HPI   Past Medical History:  Diagnosis Date  . AKI (acute kidney injury) ([place]) 07/27/2013  . Cardiomyopathy ([place]) 07/25/2013  . CHF (congestive heart failure) ([place])   . Chicken pox   . Chronic systolic heart failure ([place]) 01/04/2014  . GERD (gastroesophageal reflux disease)   . Headache    "maybe 5 times/yr; nothing regular" (12/06/2014)  . Heart block 07/25/2013  . Hyperlipemia   . Hypertension   .  Hypokalemia 07/26/2013  . Hypoxia 12/06/2014  . Obesity, morbid ([place]) 12/27/2013  . OSA (obstructive sleep apnea)   . Sleep apnea   . SOB (shortness of breath) 07/23/2013  . Tachy-brady syndrome ([place]) 07/26/2013  . Tachycardia 07/23/2013  . Type II diabetes mellitus ([place])     Social History   Socioeconomic History  . Marital status: Divorced    Spouse name: Not on file  . Number of children: 3  . Years of education: Not on file  . Highest education level: Not on file  Occupational History  . Occupation: Herbalist: [employer]  Tobacco Use  . Smoking status: Never Smoker  . Smokeless tobacco: Never Used  Vaping Use  . Vaping Use: Never used  Substance and Sexual Activity  . Alcohol use: No    Comment: 12/06/2014 "might have a beer a couple times/month"  . Drug use: No  . Sexual activity: Yes  Other Topics Concern  . 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       Social Determinants of Health   Financial Resource Strain:   . Difficulty of Paying Living Expenses:   Food Insecurity:   . Worried About Charity fundraiser in the Last Year:   . Arboriculturist in the Last Year:   Transportation Needs:   . Film/video editor (Medical):   Marland Kitchen Lack of Transportation (Non-Medical):   Physical Activity:   . Days of Exercise per Week:   .  Minutes of Exercise per Session:   Stress:   . Feeling of Stress :   Social Connections:   . Frequency of Communication with Friends and Family:   . Frequency of Social Gatherings with Friends and Family:   . Attends Religious Services:   . Active Member of Clubs or Organizations:   . Attends Archivist Meetings:   Marland Kitchen Marital Status:   Intimate Partner Violence:   . Fear of Current or Ex-Partner:   . Emotionally Abused:   Marland Kitchen Physically Abused:   . Sexually Abused:     Past Surgical History:  Procedure Laterality Date  . ABDOMINAL SURGERY  ~ 1987   tumor  removed from abd. non malignant  . APPENDECTOMY  ~ 1989  . CARDIAC CATHETERIZATION  07/2013  . LEFT AND RIGHT HEART CATHETERIZATION WITH CORONARY ANGIOGRAM N/A 07/26/2013   Procedure: LEFT AND RIGHT HEART CATHETERIZATION WITH CORONARY ANGIOGRAM;  Surgeon: Leonie Man, MD;  Location: [facility] CATH LAB;  Service: Cardiovascular;  Laterality: N/A;    Family History  Problem Relation Age of Onset  . Hypertension Mother   . Hypertension Father   . Sleep apnea Father     No Known Allergies  Current Outpatient Medications on File Prior to Visit  Medication Sig Dispense Refill  . aspirin 81 MG EC tablet Take 1 tablet (81 mg total) by mouth daily. 30 tablet 6  . baclofen (LIORESAL) 10 MG tablet Take 1 tablet (10 mg total) by mouth at bedtime. 10 each 0  . Blood Glucose Monitoring Suppl (TRUE METRIX METER) W/DEVICE KIT 1 each by Does not apply route once as needed. 1 kit 0  . carvedilol (COREG) 12.5 MG tablet Take 1.5 tablets (18.75 mg total) by mouth 2 (two) times daily with a meal. 270 tablet 3  . colchicine 0.6 MG tablet Take 1 tablet (0.6 mg total) by mouth every 6 (six) hours as needed (gout pain). Take 2 times a day as tolerated. If you have diarrhea or other stomach symptoms, decreased to 1 time a day. 60 tablet 0  . cyclobenzaprine (FLEXERIL) 10 MG tablet Take 10 mg by mouth at bedtime.    . furosemide (LASIX) 40 MG tablet Take 1 tablet (40 mg total) by mouth daily as needed. 90 tablet 1  . glucose blood (TRUE METRIX BLOOD GLUCOSE TEST) test strip Used 3 times daily before meals. 100 each 12  . hydrALAZINE (APRESOLINE) 50 MG tablet TAKE 1&1/2 TABLET 3 TIMES DAILY 135 tablet 5  . ibuprofen (ADVIL) 600 MG tablet Take 600 mg by mouth 3 (three) times daily.    . isosorbide mononitrate (IMDUR) 30 MG 24 hr tablet TAKE 1 TABLET (30 MG TOTAL) BY MOUTH DAILY. NEEDS APPOINTMENT 90 tablet 3  . levocetirizine (XYZAL) 5 MG tablet Take 5 mg by mouth at bedtime as needed.    . metFORMIN (GLUCOPHAGE) 500 MG  tablet TAKE 1 TABLET BY MOUTH TWICE A DAY WITH FOOD 180 tablet 1  . methylPREDNISolone (MEDROL DOSEPAK) 4 MG TBPK tablet Take as directed 21 tablet 0  . potassium chloride (KLOR-CON) 10 MEQ tablet TAKE 1 TABLET BY MOUTH EVERY DAY 90 tablet 3  . sacubitril-valsartan (ENTRESTO) 97-103 MG Take 1 tablet by mouth 2 (two) times daily. 180 tablet 3  . spironolactone (ALDACTONE) 25 MG tablet TAKE 1 TABLET BY MOUTH EVERY DAY 90 tablet 1  . TRUEPLUS LANCETS 30G MISC 1 each by Does not apply route every morning. 50 each 11   No current  facility-administered medications on file prior to visit.    There were no vitals taken for this visit.      Objective:   Physical Exam Constitutional:      Appearance: Normal appearance. He is obese.  Musculoskeletal:        General: Tenderness (along left scapula and right flank ) present. Normal range of motion.  Skin:    Capillary Refill: Capillary refill takes less than 2 seconds.  Neurological:     General: No focal deficit present.     Mental Status: He is alert and oriented to person, place, and time.  Psychiatric:        Mood and Affect: Mood normal.        Behavior: Behavior normal.        Thought Content: Thought content normal.        Judgment: Judgment normal.       Assessment & Plan:  1. Upper back pain on left side - continues to be muscular in origin  - Will refer to PT for treatment - Ambulatory referral to Physical Therapy - Use warm compress and get a massage  2. Screening for STDs (sexually transmitted diseases)  - HIV Antibody (routine testing w rflx); Future - RPR; Future - Urine cytology ancillary only; Future   Dorothyann Peng, NP

## 2019-12-22 LAB — URINE CYTOLOGY ANCILLARY ONLY
Chlamydia: NEGATIVE
Comment: NEGATIVE
Comment: NEGATIVE
Comment: NORMAL
Neisseria Gonorrhea: NEGATIVE
Trichomonas: NEGATIVE

## 2019-12-22 LAB — RPR: RPR Ser Ql: NONREACTIVE

## 2019-12-22 LAB — HIV ANTIBODY (ROUTINE TESTING W REFLEX): HIV 1&2 Ab, 4th Generation: NONREACTIVE

## 2019-12-27 ENCOUNTER — Other Ambulatory Visit (HOSPITAL_COMMUNITY): Payer: Self-pay | Admitting: Internal Medicine

## 2019-12-27 DIAGNOSIS — I509 Heart failure, unspecified: Secondary | ICD-10-CM

## 2020-01-17 LAB — HM DIABETES EYE EXAM

## 2020-01-19 ENCOUNTER — Encounter: Payer: Self-pay | Admitting: Family Medicine

## 2020-02-02 ENCOUNTER — Other Ambulatory Visit: Payer: Self-pay

## 2020-02-02 ENCOUNTER — Encounter: Payer: Self-pay | Admitting: Adult Health

## 2020-02-02 ENCOUNTER — Other Ambulatory Visit (HOSPITAL_COMMUNITY)
Admission: RE | Admit: 2020-02-02 | Discharge: 2020-02-02 | Disposition: A | Discharge status: Home / Self Care | Payer: BC Managed Care – PPO | Source: Ambulatory Visit | Attending: Adult Health | Admitting: Adult Health

## 2020-02-02 ENCOUNTER — Ambulatory Visit (INDEPENDENT_AMBULATORY_CARE_PROVIDER_SITE_OTHER): Payer: BC Managed Care – PPO | Admitting: Adult Health

## 2020-02-02 VITALS — BP 120/84 | Temp 98.3°F | Ht 72.0 in | Wt 306.0 lb

## 2020-02-02 DIAGNOSIS — E1169 Type 2 diabetes mellitus with other specified complication: Secondary | ICD-10-CM | POA: Diagnosis not present

## 2020-02-02 DIAGNOSIS — R300 Dysuria: Secondary | ICD-10-CM | POA: Diagnosis not present

## 2020-02-02 DIAGNOSIS — I10 Essential (primary) hypertension: Secondary | ICD-10-CM

## 2020-02-02 DIAGNOSIS — Z113 Encounter for screening for infections with a predominantly sexual mode of transmission: Secondary | ICD-10-CM | POA: Insufficient documentation

## 2020-02-02 DIAGNOSIS — Z0000 Encounter for general adult medical examination without abnormal findings: Secondary | ICD-10-CM | POA: Diagnosis not present

## 2020-02-02 DIAGNOSIS — G4733 Obstructive sleep apnea (adult) (pediatric): Secondary | ICD-10-CM

## 2020-02-02 DIAGNOSIS — Z125 Encounter for screening for malignant neoplasm of prostate: Secondary | ICD-10-CM

## 2020-02-02 DIAGNOSIS — I5022 Chronic systolic (congestive) heart failure: Secondary | ICD-10-CM

## 2020-02-02 NOTE — Progress Notes (Signed)
Subjective:    Patient ID: Vernon Reed, male    DOB: 09/19/1975, 44 y.o.   MRN: [id number]  HPI Patient presents for yearly preventative medicine examination. He is a pleasant 44 year old male who  has a past medical history of AKI (acute kidney injury) ([place]) (07/27/2013), Cardiomyopathy ([place]) (07/25/2013), CHF (congestive heart failure) ([place]), Chicken pox, Chronic systolic heart failure ([place]) (01/04/2014), GERD (gastroesophageal reflux disease), Headache, Heart block (07/25/2013), Hyperlipemia, Hypertension, Hypokalemia (07/26/2013), Hypoxia (12/06/2014), Obesity, morbid ([place]) (12/27/2013), OSA (obstructive sleep apnea), Sleep apnea, SOB (shortness of breath) (07/23/2013), Tachy-brady syndrome ([place]) (07/26/2013), Tachycardia (07/23/2013), and Type II diabetes mellitus ([place]).  DM -currently prescribed Metformin 500 mg daily.  He was last seen for diabetes check in April 2021.  At this time his A1c had dropped from 6.3-5.6.  At this time he was actively working on weight loss through diet and exercise and had been able to lose approximately 19 pounds.  He has not been working out recently and his diet has also suffered.  He plans to get back into the gym and start making lifestyle modifications again  Wt Readings from Last 3 Encounters:  02/02/20 (!) 306 lb (138.8 kg)  12/21/19 (!) 306 lb (138.8 kg)  11/03/19 299 lb (135.6 kg)    OSA -has CPAP but does not wear it due to "I need a new adapter".  He has not followed up with pulmonary for some time.  Hypertension-is managed by cardiology.  Currently prescribed Coreg 12.5 mg twice daily, Entresto 97-103 mg daily, Lasix 40 mg daily, hydralazine 75 mg 3 times daily, Aldactone 25 mg daily, and Imdur 30 mg daily.  He denies issues with dizziness, lightheadedness, chest pain, or shortness of breath  CHF-is managed by cardiology.  His last echocardiogram was done in September 2020.  His EF had improved to a normal level.  Denies chest pain or shortness of  breath  Concern for STD-reports that about a week ago he had a new sexual partner, after sex he felt as though he may have "cut myself" as it stung when he urinated.  The next day when he urinated he noticed a small amount of blood.  He has not had any symptoms since except for some mild dysuria.  He does not use condoms  All immunizations and health maintenance protocols were reviewed with the patient and needed orders were placed.  Appropriate screening laboratory values were ordered for the patient including screening of hyperlipidemia, renal function and hepatic function. If indicated by BPH, a PSA was ordered.  Medication reconciliation,  past medical history, social history, problem list and allergies were reviewed in detail with the patient  Goals were established with regard to weight loss, exercise, and  diet in compliance with medications  Review of Systems  Constitutional: Negative.   HENT: Negative.   Eyes: Negative.   Respiratory: Negative.   Cardiovascular: Negative.   Gastrointestinal: Negative.   Endocrine: Negative.   Genitourinary: Positive for dysuria and hematuria. Negative for frequency and genital sores.  Musculoskeletal: Negative.   Skin: Negative.   Allergic/Immunologic: Negative.   Neurological: Negative.   Hematological: Negative.   Psychiatric/Behavioral: Negative.   All other systems reviewed and are negative.  Past Medical History:  Diagnosis Date  . AKI (acute kidney injury) ([place]) 07/27/2013  . Cardiomyopathy ([place]) 07/25/2013  . CHF (congestive heart failure) ([place])   . Chicken pox   . Chronic systolic heart failure ([place]) 01/04/2014  . GERD (gastroesophageal reflux disease)   .  Headache    "maybe 5 times/yr; nothing regular" (12/06/2014)  . Heart block 07/25/2013  . Hyperlipemia   . Hypertension   . Hypokalemia 07/26/2013  . Hypoxia 12/06/2014  . Obesity, morbid ([place]) 12/27/2013  . OSA (obstructive sleep apnea)   . Sleep apnea   . SOB (shortness of  breath) 07/23/2013  . Tachy-brady syndrome ([place]) 07/26/2013  . Tachycardia 07/23/2013  . Type II diabetes mellitus ([place])     Social History   Socioeconomic History  . Marital status: Divorced    Spouse name: Not on file  . Number of children: 3  . Years of education: Not on file  . Highest education level: Not on file  Occupational History  . Occupation: Herbalist: [employer]  Tobacco Use  . Smoking status: Never Smoker  . Smokeless tobacco: Never Used  Vaping Use  . Vaping Use: Never used  Substance and Sexual Activity  . Alcohol use: No    Comment: 12/06/2014 "might have a beer a couple times/month"  . Drug use: No  . Sexual activity: Yes  Other Topics Concern  . 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       Social Determinants of Health   Financial Resource Strain:   . Difficulty of Paying Living Expenses:   Food Insecurity:   . Worried About Charity fundraiser in the Last Year:   . Arboriculturist in the Last Year:   Transportation Needs:   . Film/video editor (Medical):   Marland Kitchen Lack of Transportation (Non-Medical):   Physical Activity:   . Days of Exercise per Week:   . Minutes of Exercise per Session:   Stress:   . Feeling of Stress :   Social Connections:   . Frequency of Communication with Friends and Family:   . Frequency of Social Gatherings with Friends and Family:   . Attends Religious Services:   . Active Member of Clubs or Organizations:   . Attends Archivist Meetings:   Marland Kitchen Marital Status:   Intimate Partner Violence:   . Fear of Current or Ex-Partner:   . Emotionally Abused:   Marland Kitchen Physically Abused:   . Sexually Abused:     Past Surgical History:  Procedure Laterality Date  . 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 APPENDECTOMY  ~ 1989  . CARDIAC CATHETERIZATION  07/2013  . LEFT AND RIGHT HEART CATHETERIZATION WITH CORONARY  ANGIOGRAM N/A 07/26/2013   Procedure: LEFT AND RIGHT HEART CATHETERIZATION WITH CORONARY ANGIOGRAM;  Surgeon: Leonie Man, MD;  Location: [facility] CATH LAB;  Service: Cardiovascular;  Laterality: N/A;    Family History  Problem Relation Age of Onset  . Hypertension Mother   . Hypertension Father   . Sleep apnea Father     No Known Allergies  Current Outpatient Medications on File Prior to Visit  Medication Sig Dispense Refill  . aspirin 81 MG EC tablet Take 1 tablet (81 mg total) by mouth daily. 30 tablet 6  . Blood Glucose Monitoring Suppl (TRUE METRIX METER) W/DEVICE KIT 1 each by Does not apply route once as needed. 1 kit 0  . carvedilol (COREG) 12.5 MG tablet Take 1.5 tablets (18.75 mg total) by mouth 2 (two) times daily with a meal. 270 tablet 3  . furosemide (LASIX) 40 MG tablet Take 1 tablet (40 mg total)  by mouth daily as needed. 90 tablet 1  . glucose blood (TRUE METRIX BLOOD GLUCOSE TEST) test strip Used 3 times daily before meals. 100 each 12  . hydrALAZINE (APRESOLINE) 50 MG tablet TAKE 1&1/2 TABLET 3 TIMES DAILY 135 tablet 5  . ibuprofen (ADVIL) 600 MG tablet Take 600 mg by mouth 3 (three) times daily.    . isosorbide mononitrate (IMDUR) 30 MG 24 hr tablet TAKE 1 TABLET (30 MG TOTAL) BY MOUTH DAILY. NEEDS APPOINTMENT 90 tablet 3  . metFORMIN (GLUCOPHAGE) 500 MG tablet TAKE 1 TABLET BY MOUTH TWICE A DAY WITH FOOD 180 tablet 1  . potassium chloride (KLOR-CON) 10 MEQ tablet TAKE 1 TABLET BY MOUTH EVERY DAY 90 tablet 3  . sacubitril-valsartan (ENTRESTO) 97-103 MG Take 1 tablet by mouth 2 (two) times daily. 180 tablet 3  . spironolactone (ALDACTONE) 25 MG tablet TAKE 1 TABLET BY MOUTH EVERY DAY 90 tablet 1  . TRUEPLUS LANCETS 30G MISC 1 each by Does not apply route every morning. 50 each 11  . colchicine 0.6 MG tablet Take 1 tablet (0.6 mg total) by mouth every 6 (six) hours as needed (gout pain). Take 2 times a day as tolerated. If you have diarrhea or other stomach symptoms,  decreased to 1 time a day. (Patient not taking: Reported on 02/02/2020) 60 tablet 0  . cyclobenzaprine (FLEXERIL) 10 MG tablet Take 10 mg by mouth at bedtime. (Patient not taking: Reported on 02/02/2020)    . levocetirizine (XYZAL) 5 MG tablet Take 5 mg by mouth at bedtime as needed. (Patient not taking: Reported on 02/02/2020)     No current facility-administered medications on file prior to visit.    BP 120/84   Temp 98.3 F (36.8 C)   Ht 6' (1.829 m)   Wt (!) 306 lb (138.8 kg)   BMI 41.50 kg/m       Objective:   Physical Exam Vitals and nursing note reviewed.  Constitutional:      General: He is not in acute distress.    Appearance: Normal appearance. He is well-developed. He is obese.  HENT:     Head: Normocephalic and atraumatic.     Right Ear: Tympanic membrane, ear canal and external ear normal. There is no impacted cerumen.     Left Ear: Tympanic membrane, ear canal and external ear normal. There is no impacted cerumen.     Nose: Nose normal. No congestion or rhinorrhea.     Mouth/Throat:     Mouth: Mucous membranes are moist.     Pharynx: Oropharynx is clear. No oropharyngeal exudate or posterior oropharyngeal erythema.  Eyes:     General:        Right eye: No discharge.        Left eye: No discharge.     Extraocular Movements: Extraocular movements intact.     Conjunctiva/sclera: Conjunctivae normal.     Pupils: Pupils are equal, round, and reactive to light.  Neck:     Vascular: No carotid bruit.     Trachea: No tracheal deviation.  Cardiovascular:     Rate and Rhythm: Normal rate and regular rhythm.     Pulses: Normal pulses.     Heart sounds: Normal heart sounds. No murmur heard.  No friction rub. No gallop.   Pulmonary:     Effort: Pulmonary effort is normal. No respiratory distress.     Breath sounds: Normal breath sounds. No stridor. No wheezing, rhonchi or rales.  Chest:  Chest wall: No tenderness.  Abdominal:     General: Bowel sounds are normal.  There is no distension.     Palpations: Abdomen is soft. There is no mass.     Tenderness: There is no abdominal tenderness. There is no right CVA tenderness, left CVA tenderness, guarding or rebound.     Hernia: No hernia is present.  Musculoskeletal:        General: No swelling, tenderness, deformity or signs of injury. Normal range of motion.     Right lower leg: No edema.     Left lower leg: No edema.  Lymphadenopathy:     Cervical: No cervical adenopathy.  Skin:    General: Skin is warm and dry.     Capillary Refill: Capillary refill takes less than 2 seconds.     Coloration: Skin is not jaundiced or pale.     Findings: No bruising, erythema, lesion or rash.  Neurological:     General: No focal deficit present.     Mental Status: He is alert and oriented to person, place, and time.     Cranial Nerves: No cranial nerve deficit.     Sensory: No sensory deficit.     Motor: No weakness.     Coordination: Coordination normal.     Gait: Gait normal.     Deep Tendon Reflexes: Reflexes normal.  Psychiatric:        Mood and Affect: Mood normal.        Behavior: Behavior normal.        Thought Content: Thought content normal.        Judgment: Judgment normal.       Assessment & Plan:  1. Routine general medical examination at a health care facility - Follow up in one year or sooner if needed - Needs significant weight loss for overall health  - CBC with Differential/Platelet; Future - Hemoglobin A1c; Future - Lipid panel; Future - TSH; Future - CMP with eGFR([facility]); Future - CMP with eGFR([facility]) - TSH - Lipid panel - Hemoglobin A1c - CBC with Differential/Platelet  2. Type 2 diabetes mellitus with other specified complication, unspecified whether long term insulin use (HCC) -Consider change in dose of metformin - Follow up in 3-6 months  - CBC with Differential/Platelet; Future - Hemoglobin A1c; Future - Lipid panel; Future - TSH; Future - CMP with eGFR([facility]);  Future - CMP with eGFR([facility]) - TSH - Lipid panel - Hemoglobin A1c - CBC with Differential/Platelet  3. Obesity, morbid ([place]) -Encourage lifestyle modifications, he was doing very well when he was actually eating healthy and exercising. - CBC with Differential/Platelet; Future - Hemoglobin A1c; Future - Lipid panel; Future - TSH; Future - CMP with eGFR([facility]); Future - CMP with eGFR([facility]) - TSH - Lipid panel - Hemoglobin A1c - CBC with Differential/Platelet  4. Essential hypertension - BP at goal - CBC with Differential/Platelet; Future - Hemoglobin A1c; Future - Lipid panel; Future - TSH; Future - CMP with eGFR([facility]); Future - CMP with eGFR([facility]) - TSH - Lipid panel - Hemoglobin A1c - CBC with Differential/Platelet  5. Chronic systolic heart failure ([place]) - Follow up with Cardiology as directed - CBC with Differential/Platelet; Future - Hemoglobin A1c; Future - Lipid panel; Future - TSH; Future - CMP with eGFR([facility]); Future - CMP with eGFR([facility]) - TSH - Lipid panel - Hemoglobin A1c - CBC with Differential/Platelet  6. Prostate cancer screening  - PSA; Future - PSA  7. OSA (obstructive sleep apnea) - Referral to pulmonary  -  CBC with Differential/Platelet; Future - Hemoglobin A1c; Future - Lipid panel; Future - TSH; Future - CMP with eGFR([facility]); Future - CMP with eGFR([facility]) - TSH - Lipid panel - Hemoglobin A1c - CBC with Differential/Platelet - Ambulatory referral to Pulmonology  8. Screen for STD (sexually transmitted disease) -Discussed PreP -and he is interested in starting this.  Encouraged safe sex practices as I have in the past.  Wait for HIV test to come back and then send in prep if it is negative - HIV Antibody (routine testing w rflx); Future - RPR; Future - Urine cytology ancillary only; Future - Urinalysis; Future - RPR - HIV Antibody (routine testing w rflx) - Urinalysis - Urine cytology ancillary only  9. Dysuria -  Consider antibiotic treatment  - Urine cytology ancillary only; Future - Urinalysis; Future  Dorothyann Peng, NP

## 2020-02-03 ENCOUNTER — Other Ambulatory Visit: Payer: Self-pay

## 2020-02-03 ENCOUNTER — Other Ambulatory Visit: Payer: Self-pay | Admitting: Adult Health

## 2020-02-03 LAB — COMPLETE METABOLIC PANEL WITH GFR
AG Ratio: 1.3 (calc) (ref 1.0–2.5)
ALT: 21 U/L (ref 9–46)
AST: 11 U/L (ref 10–40)
Albumin: 4 g/dL (ref 3.6–5.1)
Alkaline phosphatase (APISO): 47 U/L (ref 36–130)
BUN: 15 mg/dL (ref 7–25)
CO2: 28 mmol/L (ref 20–32)
Calcium: 9 mg/dL (ref 8.6–10.3)
Chloride: 103 mmol/L (ref 98–110)
Creat: 1.16 mg/dL (ref 0.60–1.35)
GFR, Est African American: 88 mL/min/{1.73_m2} (ref >=60)
GFR, Est Non African American: 76 mL/min/{1.73_m2} (ref >=60)
Globulin: 3.1 g/dL (calc) (ref 1.9–3.7)
Glucose, Bld: 135 mg/dL — ABNORMAL HIGH (ref 65–99)
Potassium: 4.1 mmol/L (ref 3.5–5.3)
Sodium: 139 mmol/L (ref 135–146)
Total Bilirubin: 0.7 mg/dL (ref 0.2–1.2)
Total Protein: 7.1 g/dL (ref 6.1–8.1)

## 2020-02-03 LAB — CBC WITH DIFFERENTIAL/PLATELET
Absolute Monocytes: 611 cells/uL (ref 200–950)
Basophils Absolute: 17 cells/uL (ref 0–200)
Basophils Relative: 0.2 %
Eosinophils Absolute: 267 cells/uL (ref 15–500)
Eosinophils Relative: 3.1 %
HCT: 41.3 % (ref 38.5–50.0)
Hemoglobin: 13.6 g/dL (ref 13.2–17.1)
Lymphs Abs: 2528 cells/uL (ref 850–3900)
MCH: 30.6 pg (ref 27.0–33.0)
MCHC: 32.9 g/dL (ref 32.0–36.0)
MCV: 92.8 fL (ref 80.0–100.0)
MPV: 10.6 fL (ref 7.5–12.5)
Monocytes Relative: 7.1 %
Neutro Abs: 5177 cells/uL (ref 1500–7800)
Neutrophils Relative %: 60.2 %
Platelets: 286 10*3/uL (ref 140–400)
RBC: 4.45 10*6/uL (ref 4.20–5.80)
RDW: 12.6 % (ref 11.0–15.0)
Total Lymphocyte: 29.4 %
WBC: 8.6 10*3/uL (ref 3.8–10.8)

## 2020-02-03 LAB — TSH: TSH: 2.75 mIU/L (ref 0.40–4.50)

## 2020-02-03 LAB — URINE CYTOLOGY ANCILLARY ONLY
Chlamydia: NEGATIVE
Comment: NEGATIVE
Comment: NEGATIVE
Comment: NORMAL
Neisseria Gonorrhea: NEGATIVE
Trichomonas: POSITIVE — AB

## 2020-02-03 LAB — LIPID PANEL
Cholesterol: 171 mg/dL (ref <200)
HDL: 29 mg/dL — ABNORMAL LOW (ref >=40)
LDL Cholesterol (Calc): 111 mg/dL (calc) — ABNORMAL HIGH
Non-HDL Cholesterol (Calc): 142 mg/dL (calc) — ABNORMAL HIGH (ref <130)
Total CHOL/HDL Ratio: 5.9 (calc) — ABNORMAL HIGH (ref <5.0)
Triglycerides: 185 mg/dL — ABNORMAL HIGH (ref <150)

## 2020-02-03 LAB — RPR: RPR Ser Ql: NONREACTIVE

## 2020-02-03 LAB — HEMOGLOBIN A1C
Hgb A1c MFr Bld: 6.2 % of total Hgb — ABNORMAL HIGH (ref <5.7)
Mean Plasma Glucose: 131 (calc)
eAG (mmol/L): 7.3 (calc)

## 2020-02-03 LAB — HIV ANTIBODY (ROUTINE TESTING W REFLEX): HIV 1&2 Ab, 4th Generation: NONREACTIVE

## 2020-02-03 LAB — PSA: PSA: 0.2 ng/mL (ref <=4.0)

## 2020-02-03 MED ORDER — SIMVASTATIN 10 MG PO TABS
10.0000 mg | ORAL_TABLET | Freq: Every day | ORAL | 3 refills | Status: DC
Start: 2020-02-03 — End: 2020-02-06

## 2020-02-03 MED ORDER — METRONIDAZOLE 500 MG PO TABS: 500.0000 mg | ORAL_TABLET | Freq: Two times a day (BID) | ORAL | 0 refills | Status: AC

## 2020-02-03 MED FILL — SIMVASTATIN 10 MG TABLET: 10 | 30 days supply | Qty: 30 | Fill #0

## 2020-02-04 LAB — URINALYSIS
Bilirubin Urine: NEGATIVE
Glucose, UA: NEGATIVE
Hgb urine dipstick: NEGATIVE
Ketones, ur: NEGATIVE
Nitrite: NEGATIVE
Protein, ur: NEGATIVE
Specific Gravity, Urine: 1.014 (ref 1.001–1.03)
pH: <=5 (ref 5.0–8.0)

## 2020-02-06 ENCOUNTER — Encounter: Payer: Self-pay | Admitting: Cardiovascular Disease

## 2020-02-06 ENCOUNTER — Other Ambulatory Visit: Payer: Self-pay

## 2020-02-06 ENCOUNTER — Ambulatory Visit (INDEPENDENT_AMBULATORY_CARE_PROVIDER_SITE_OTHER): Payer: BC Managed Care – PPO | Admitting: Cardiovascular Disease

## 2020-02-06 VITALS — BP 110/80 | HR 75 | Ht 72.0 in | Wt 307.0 lb

## 2020-02-06 DIAGNOSIS — I158 Other secondary hypertension: Secondary | ICD-10-CM

## 2020-02-06 DIAGNOSIS — I5022 Chronic systolic (congestive) heart failure: Secondary | ICD-10-CM | POA: Diagnosis not present

## 2020-02-06 NOTE — Progress Notes (Signed)
Cardiology Office Note:    Date:  02/06/2020   ID:  Vernon Reed, DOB 05/05/1976, MRN [id number]  PCP:  Dorothyann Peng, NP  Cardiologist:  Wyvonne Lenz, previous CHF clinic  Electrophysiologist:  None   Referring MD: Dorothyann Peng, NP   Chief Complaint  Patient presents with   Congestive Heart Failure    April 14, 2019   Vernon Reed is a 44 y.o. male with a hx of chronic systolic congestive heart failure.  He has been seen by the heart failure clinic for the past several years and his ejection fraction is now normal.  He has moderate left ventricular hypertrophy.  He has grade 1 diastolic dysfunction.  Takes his meds,  Tries to avoid salt .  No CP  Able to do his normal activties  has been getting some exercise  Knows that he needs to lose some weight .  Has OSA , he wears a CPAP mask. Heart cath in 2015 revealed normal coronary arteries.  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 He has a hx of obesity .   Wt today is 307 lbs.  Has not lost any weight .    Busy with the band  Sings with the [employer] for past several years  Still with [employer] also Is not workint out.    Eats unhealthy foods frequently  - after playing a jig, only fast foods are available  We discussed the fact that as he lost weight we could gradually cut back on some of the medications.  He needs to work on weight loss and improvement of his diet before we start cutting back on his medications.   Past Medical History:  Diagnosis Date   AKI (acute kidney injury) ([place]) 07/27/2013   Cardiomyopathy ([place]) 07/25/2013   CHF (congestive heart failure) ([place])    Chicken pox    Chronic systolic heart failure ([place]) 01/04/2014   GERD (gastroesophageal reflux disease)    Headache    "maybe 5 times/yr; nothing regular" (12/06/2014)   Heart block 07/25/2013   Hyperlipemia     Hypertension    Hypokalemia 07/26/2013   Hypoxia 12/06/2014   Obesity, morbid ([place]) 12/27/2013   OSA (obstructive sleep apnea)    Sleep apnea    SOB (shortness of breath) 07/23/2013   Tachy-brady syndrome ([place]) 07/26/2013   Tachycardia 07/23/2013   Type II diabetes mellitus ([place])     Past Surgical History:  Procedure Laterality Date   ABDOMINAL SURGERY  ~ 1987   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   APPENDECTOMY  ~ [place]  07/2013   LEFT AND RIGHT HEART CATHETERIZATION WITH CORONARY ANGIOGRAM N/A 07/26/2013   Procedure: LEFT AND RIGHT HEART CATHETERIZATION WITH CORONARY ANGIOGRAM;  Surgeon: Leonie Man, MD;  Location: [facility] CATH LAB;  Service: Cardiovascular;  Laterality: N/A;    Current Medications: Current Meds  Medication Sig   aspirin 81 MG EC tablet Take 1 tablet (81 mg total) by mouth daily.   Blood Glucose Monitoring Suppl (TRUE METRIX METER) W/DEVICE KIT 1 each by Does not apply route once as needed.   carvedilol (COREG) 12.5 MG tablet Take 1.5 tablets (18.75 mg total) by mouth 2 (two) times daily with a meal.   furosemide (LASIX) 40 MG tablet Take 1 tablet (40 mg total) by mouth daily as needed.  glucose blood (TRUE METRIX BLOOD GLUCOSE TEST) test strip Used 3 times daily before meals.   hydrALAZINE (APRESOLINE) 50 MG tablet TAKE 1&1/2 TABLET 3 TIMES DAILY   ibuprofen (ADVIL) 600 MG tablet Take 600 mg by mouth 3 (three) times daily.   isosorbide mononitrate (IMDUR) 30 MG 24 hr tablet TAKE 1 TABLET (30 MG TOTAL) BY MOUTH DAILY. NEEDS APPOINTMENT   metFORMIN (GLUCOPHAGE) 500 MG tablet TAKE 1 TABLET BY MOUTH TWICE A DAY WITH FOOD   metroNIDAZOLE (FLAGYL) 500 MG tablet Take 1 tablet (500 mg total) by mouth 2 (two) times daily for 7 days.   potassium chloride (KLOR-CON) 10 MEQ tablet TAKE 1 TABLET BY MOUTH EVERY DAY   sacubitril-valsartan (ENTRESTO) 97-103 MG Take 1 tablet by mouth 2 (two) times daily.   spironolactone (ALDACTONE)  25 MG tablet TAKE 1 TABLET BY MOUTH EVERY DAY   TRUEPLUS LANCETS 30G MISC 1 each by Does not apply route every morning.     Allergies:   Patient has no known allergies.   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       Social Determinants of Health   Financial Resource Strain:    Difficulty of Paying Living Expenses:   Food Insecurity:    Worried About Charity fundraiser in the Last Year:    Arboriculturist in the Last Year:   Transportation Needs:    Film/video editor (Medical):    Lack of Transportation (Non-Medical):   Physical Activity:    Days of Exercise per Week:    Minutes of Exercise per Session:   Stress:    Feeling of Stress :   Social Connections:    Frequency of Communication with Friends and Family:    Frequency of Social Gatherings with Friends and Family:    Attends Religious Services:    Active Member of Clubs or Organizations:    Attends Archivist Meetings:    Marital Status:      Family History: The patient's family history includes Hypertension in his father and mother; Sleep apnea in his father.  ROS:   Please see the history of present illness.     All other systems reviewed and are negative.  EKGs/Labs/Other Studies Reviewed:    The following studies were reviewed today:   EKG:    April 14, 2019: Normal sinus rhythm at 80.  Moderate voltage criteria for left ventricular hypertrophy.  Recent Labs: 02/02/2020: ALT 21; BUN 15; Creat 1.16; Hemoglobin 13.6; Platelets 286; Potassium 4.1; Sodium 139; TSH 2.75  Recent Lipid Panel    Component Value Date/Time   CHOL 171 02/02/2020 0836   TRIG 185 (H) 02/02/2020 0836   HDL 29 (L) 02/02/2020 0836   CHOLHDL 5.9 (H)  02/02/2020 0836   VLDL 24.4 02/01/2019 1038   LDLCALC 111 (H) 02/02/2020 0836    Physical Exam:    Physical Exam: Blood pressure 110/80, pulse 75, height 6' (1.829 m), weight (!) 307 lb (139.3 kg), SpO2 97 %.  GEN:  Young man,  Morbidly obese.   HEENT: Normal NECK: No JVD; No carotid bruits LYMPHATICS: No lymphadenopathy CARDIAC: RRR , no murmurs, rubs, gallops RESPIRATORY:  Clear to auscultation without rales, wheezing or rhonchi  ABDOMEN: Soft, non-tender, non-distended MUSCULOSKELETAL:  No edema; No deformity  SKIN: Warm and dry NEUROLOGIC:  Alert and oriented x 3    ASSESSMENT:    No diagnosis found. PLAN:    In order of problems listed above:  1.  History of chronic systolic congestive heart failure:   Annie Main seems to be doing well.  His ejection fraction has improved.  I would like to continue with same medications.  I encouraged him to work on diet, exercise, weight loss.  As he loses weight will probably be able to cut back on some of his medications which is one of his goals.  He admits to not eating very healthfully.  He plays in his bands several times a week and admits that there is really not healthy food available after they quit playing. Encouraged him to work on eating better into working out.  We will continue to watch him closely.  I will see him again in 6 months.    Medication Adjustments/Labs and Tests Ordered: Current medicines are reviewed at length with the patient today.  Concerns regarding medicines are outlined above.  No orders of the defined types were placed in this  encounter.  No orders of the defined types were placed in this encounter.   Patient Instructions  Medication Instructions:  Your provider recommends that you continue on your current medications as directed. Please refer to the Current Medication list given to you today.   *If you need a refill on your cardiac medications before your next appointment, please call your pharmacy*   Follow-Up: At [facility], you and your health needs are our priority.  As part of our continuing mission to provide you with exceptional heart care, we have created designated Provider Care Teams.  These Care Teams include your primary Cardiologist (physician) and Advanced Practice Providers (APPs -  Physician Assistants and Nurse Practitioners) who all work together to provide you with the care you need, when you need it. Your next appointment:   6 month(s) The format for your next appointment:   In Person Provider:   You may see Mertie Moores, MD or one of the following Advanced Practice Providers on your designated Care Team:    Richardson Dopp, PA-C  Robbie Lis, [place]      Signed, Mertie Moores, MD  02/06/2020 4:16 PM    [place]

## 2020-02-06 NOTE — Patient Instructions (Signed)
Medication Instructions:  Your provider recommends that you continue on your current medications as directed. Please refer to the Current Medication list given to you today.   *If you need a refill on your cardiac medications before your next appointment, please call your pharmacy*  Follow-Up: At [facility], you and your health needs are our priority.  As part of our continuing mission to provide you with exceptional heart care, we have created designated Provider Care Teams.  These Care Teams include your primary Cardiologist (physician) and Advanced Practice Providers (APPs -  Physician Assistants and Nurse Practitioners) who all work together to provide you with the care you need, when you need it. Your next appointment:   6 month(s) The format for your next appointment:   In Person Provider:   You may see Philip Nahser, MD or one of the following Advanced Practice Providers on your designated Care Team:    Scott Weaver, PA-C  Vin Bhagat, PA-C   

## 2020-02-09 ENCOUNTER — Other Ambulatory Visit: Payer: Self-pay | Admitting: Adult Health

## 2020-02-09 MED ORDER — EMTRICITABINE-TENOFOVIR DF 200-300 MG PO TABS: 1.0000 | ORAL_TABLET | Freq: Every day | ORAL | 0 refills | Status: DC

## 2020-03-16 ENCOUNTER — Other Ambulatory Visit: Payer: Self-pay | Admitting: Adult Health

## 2020-06-04 ENCOUNTER — Other Ambulatory Visit (HOSPITAL_COMMUNITY): Payer: Self-pay | Admitting: Cardiology

## 2020-06-04 ENCOUNTER — Other Ambulatory Visit: Payer: Self-pay | Admitting: Adult Health

## 2020-06-17 ENCOUNTER — Other Ambulatory Visit: Payer: Self-pay | Admitting: Cardiology

## 2020-06-17 DIAGNOSIS — I509 Heart failure, unspecified: Secondary | ICD-10-CM

## 2020-06-20 ENCOUNTER — Telehealth: Payer: Self-pay | Admitting: Adult Health

## 2020-06-20 NOTE — Telephone Encounter (Signed)
[facility] is [facility] in stating that the pt needs a refill on Rx emtricitabine-tenofovir (TRUVADA) 200-300 MG  Pharm:  [facility]

## 2020-06-20 NOTE — Telephone Encounter (Signed)
Rx filled patient aware that he needs to give them a cal to inform on when he would like Rx delivered  .

## 2020-06-25 ENCOUNTER — Telehealth: Payer: Self-pay | Admitting: Cardiovascular Disease

## 2020-06-25 MED ORDER — FUROSEMIDE 40 MG PO TABS
40.0000 mg | ORAL_TABLET | Freq: Every day | ORAL | 2 refills | Status: DC | PRN
Start: 2020-06-25 — End: 2021-01-25

## 2020-06-25 NOTE — Telephone Encounter (Signed)
Pt's medication was sent to pt's pharmacy as requested. Confirmation received.  °

## 2020-06-25 NOTE — Telephone Encounter (Signed)
*  STAT* If patient is at the pharmacy, call can be transferred to refill team.   1. Which medications need to be refilled? (please list name of each medication and dose if known) furosemide (LASIX) 40 MG tablet  2. Which pharmacy/location (including street and city if local pharmacy) is medication to be sent to? [id number] [place] [place] - [address]  3. Do they need a 30 day or 90 day supply? 90 day supply

## 2020-07-10 ENCOUNTER — Other Ambulatory Visit: Payer: Self-pay | Admitting: Family

## 2020-07-10 MED ORDER — HYDROCOD POLST-CPM POLST ER 10-8 MG/5ML PO SUER: 5.0000 mL | Freq: Two times a day (BID) | ORAL | 0 refills | Status: DC | PRN

## 2020-07-23 ENCOUNTER — Other Ambulatory Visit (HOSPITAL_COMMUNITY): Payer: Self-pay | Admitting: Cardiovascular Disease

## 2020-07-23 DIAGNOSIS — I509 Heart failure, unspecified: Secondary | ICD-10-CM

## 2020-07-26 ENCOUNTER — Telehealth (HOSPITAL_COMMUNITY): Payer: Self-pay | Admitting: Pharmacist

## 2020-07-26 NOTE — Telephone Encounter (Signed)
Patient Advocate Encounter   Received notification from [employer] that prior authorization for Sherryll Burger is required.   PA submitted on CoverMyMeds [facility] Status is pending   Will continue to follow.   Karle Plumber, PharmD, BCPS, BCCP, CPP Heart Failure Clinic Pharmacist [phone]

## 2020-07-26 NOTE — Telephone Encounter (Signed)
Advanced Heart Failure Patient Advocate Encounter  Prior Authorization for Sherryll Burger has been approved.    Effective dates: 07/26/20 through 07/26/21  Karle Plumber, PharmD, BCPS, BCCP, CPP Heart Failure Clinic Pharmacist [phone]

## 2020-08-01 ENCOUNTER — Other Ambulatory Visit: Payer: Self-pay | Admitting: Cardiology

## 2020-08-01 ENCOUNTER — Other Ambulatory Visit: Payer: Self-pay

## 2020-08-01 DIAGNOSIS — I509 Heart failure, unspecified: Secondary | ICD-10-CM

## 2020-08-01 DIAGNOSIS — I5022 Chronic systolic (congestive) heart failure: Secondary | ICD-10-CM

## 2020-08-01 DIAGNOSIS — I10 Essential (primary) hypertension: Secondary | ICD-10-CM

## 2020-08-01 MED ORDER — ISOSORBIDE MONONITRATE ER 30 MG PO TB24: 30.0000 mg | ORAL_TABLET | Freq: Every day | ORAL | 2 refills | Status: DC

## 2020-08-09 ENCOUNTER — Encounter: Payer: Self-pay | Admitting: Cardiovascular Disease

## 2020-08-09 NOTE — Progress Notes (Signed)
This encounter was created in error - please disregard.

## 2020-08-10 ENCOUNTER — Encounter: Payer: Self-pay | Admitting: Cardiovascular Disease

## 2020-09-12 ENCOUNTER — Encounter: Payer: Self-pay | Admitting: Adult Health

## 2020-09-12 ENCOUNTER — Other Ambulatory Visit: Payer: Self-pay

## 2020-09-12 ENCOUNTER — Ambulatory Visit (INDEPENDENT_AMBULATORY_CARE_PROVIDER_SITE_OTHER): Payer: Self-pay | Admitting: Adult Health

## 2020-09-12 VITALS — BP 110/80 | HR 92 | Temp 98.8°F | Ht 72.0 in | Wt 318.1 lb

## 2020-09-12 DIAGNOSIS — E1169 Type 2 diabetes mellitus with other specified complication: Secondary | ICD-10-CM

## 2020-09-12 LAB — POCT GLYCOSYLATED HEMOGLOBIN (HGB A1C): HbA1c, POC (controlled diabetic range): 7.6 % — AB (ref 0.0–7.0)

## 2020-09-12 NOTE — Progress Notes (Signed)
Subjective:    Patient ID: Vernon Reed, male    DOB: 01-Oct-1975, 45 y.o.   MRN: [id number]  HPI  45 year old male who  has a past medical history of AKI (acute kidney injury) ([place]) (07/27/2013), Cardiomyopathy ([place]) (07/25/2013), CHF (congestive heart failure) ([place]), Chicken pox, Chronic systolic heart failure ([place]) (01/04/2014), GERD (gastroesophageal reflux disease), Headache, Heart block (07/25/2013), Hyperlipemia, Hypertension, Hypokalemia (07/26/2013), Hypoxia (12/06/2014), Obesity, morbid ([place]) (12/27/2013), OSA (obstructive sleep apnea), Sleep apnea, SOB (shortness of breath) (07/23/2013), Tachy-brady syndrome ([place]) (07/26/2013), Tachycardia (07/23/2013), and Type II diabetes mellitus ([place]).  He presents to the office today for concern of elevated blood sugar readings. He is a diabetic but has been fairly well controlled with Metformin 500 mg BID but has only been taking it daily.  He has been noticing blood sugar readings in the 180's. His diet has been poor and has been drinking more soda. He was not exercising but has gotten back into going to the gym  His weight keeps climbing. He would like to be referred to Dr. Evorn Gong to discuss bariatric surgery options   Lab Results  Component Value Date   HGBA1C 6.2 (H) 02/02/2020   Wt Readings from Last 3 Encounters:  09/12/20 (!) 318 lb 2 oz (144.3 kg)  02/06/20 (!) 307 lb (139.3 kg)  02/02/20 (!) 306 lb (138.8 kg)    Review of Systems See HPI   Past Medical History:  Diagnosis Date  . AKI (acute kidney injury) ([place]) 07/27/2013  . Cardiomyopathy ([place]) 07/25/2013  . CHF (congestive heart failure) ([place])   . Chicken pox   . Chronic systolic heart failure ([place]) 01/04/2014  . GERD (gastroesophageal reflux disease)   . Headache    "maybe 5 times/yr; nothing regular" (12/06/2014)  . Heart block 07/25/2013  . Hyperlipemia   . Hypertension   . Hypokalemia 07/26/2013  . Hypoxia 12/06/2014  . Obesity, morbid ([place]) 12/27/2013  . OSA (obstructive sleep  apnea)   . Sleep apnea   . SOB (shortness of breath) 07/23/2013  . Tachy-brady syndrome ([place]) 07/26/2013  . Tachycardia 07/23/2013  . Type II diabetes mellitus ([place])     Social History   Socioeconomic History  . Marital status: Divorced    Spouse name: Not on file  . Number of children: 3  . Years of education: Not on file  . Highest education level: Not on file  Occupational History  . Occupation: Herbalist: [employer]  Tobacco Use  . Smoking status: Never Smoker  . Smokeless tobacco: Never Used  Vaping Use  . Vaping Use: Never used  Substance and Sexual Activity  . Alcohol use: No    Comment: 12/06/2014 "might have a beer a couple times/month"  . Drug use: No  . Sexual activity: Yes  Other Topics Concern  . 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       Social Determinants of Health   Financial Resource Strain: Not on file  Food Insecurity: Not on file  Transportation Needs: Not on file  Physical Activity: Not on file  Stress: Not on file  Social Connections: Not on file  Intimate Partner Violence: Not on file    Past Surgical History:  Procedure Laterality Date  . 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 APPENDECTOMY  ~ 1989  . CARDIAC CATHETERIZATION  07/2013  . LEFT  AND RIGHT HEART CATHETERIZATION WITH CORONARY ANGIOGRAM N/A 07/26/2013   Procedure: LEFT AND RIGHT HEART CATHETERIZATION WITH CORONARY ANGIOGRAM;  Surgeon: Leonie Man, MD;  Location: [facility] CATH LAB;  Service: Cardiovascular;  Laterality: N/A;    Family History  Problem Relation Age of Onset  . Hypertension Mother   . Hypertension Father   . Sleep apnea Father     No Known Allergies  Current Outpatient Medications on File Prior to Visit  Medication Sig Dispense Refill  . aspirin 81 MG EC tablet Take 1 tablet (81 mg total) by mouth daily. 30 tablet 6  . Blood Glucose Monitoring Suppl (TRUE  METRIX METER) W/DEVICE KIT 1 each by Does not apply route once as needed. 1 kit 0  . carvedilol (COREG) 12.5 MG tablet Take 1.5 tablets (18.75 mg total) by mouth 2 (two) times daily with a meal. 270 tablet 3  . chlorpheniramine-HYDROcodone (TUSSIONEX PENNKINETIC ER) 10-8 MG/5ML SUER Take 5 mLs by mouth every 12 (twelve) hours as needed for cough. 115 mL 0  . emtricitabine-tenofovir (TRUVADA) 200-300 MG tablet TAKE 1 TABLET BY MOUTH EVERY DAY 90 tablet 0  . furosemide (LASIX) 40 MG tablet Take 1 tablet (40 mg total) by mouth daily as needed. 90 tablet 2  . glucose blood (TRUE METRIX BLOOD GLUCOSE TEST) test strip Used 3 times daily before meals. 100 each 12  . hydrALAZINE (APRESOLINE) 50 MG tablet TAKE 1&1/2 TABLET 3 TIMES DAILY 135 tablet 5  . ibuprofen (ADVIL) 600 MG tablet Take 600 mg by mouth 3 (three) times daily.    . isosorbide mononitrate (IMDUR) 30 MG 24 hr tablet Take 1 tablet (30 mg total) by mouth daily. 90 tablet 2  . metFORMIN (GLUCOPHAGE) 500 MG tablet TAKE 1 TABLET BY MOUTH TWICE A DAY WITH FOOD 180 tablet 1  . potassium chloride (KLOR-CON) 10 MEQ tablet TAKE 1 TABLET (10 MEQ TOTAL) BY MOUTH DAILY. NEEDS APPT FOR FUTURE REFILLS 90 tablet 0  . sacubitril-valsartan (ENTRESTO) 97-103 MG Take 1 tablet by mouth 2 (two) times daily. 180 tablet 3  . spironolactone (ALDACTONE) 25 MG tablet TAKE 1 TABLET BY MOUTH EVERY DAY 90 tablet 2  . TRUEPLUS LANCETS 30G MISC 1 each by Does not apply route every morning. 50 each 11   No current facility-administered medications on file prior to visit.    BP 110/80   Pulse 92   Temp 98.8 F (37.1 C) (Oral)   Ht 6' (1.829 m)   Wt (!) 318 lb 2 oz (144.3 kg)   SpO2 97%   BMI 43.15 kg/m       Objective:   Physical Exam Vitals and nursing note reviewed.  Constitutional:      Appearance: Normal appearance. He is obese.  Cardiovascular:     Rate and Rhythm: Normal rate and regular rhythm.     Pulses: Normal pulses.     Heart sounds: Normal  heart sounds.  Pulmonary:     Effort: Pulmonary effort is normal.     Breath sounds: Normal breath sounds.  Musculoskeletal:        General: Normal range of motion.  Skin:    General: Skin is warm.  Neurological:     General: No focal deficit present.     Mental Status: He is alert.  Psychiatric:        Mood and Affect: Mood normal.        Behavior: Behavior normal.        Thought Content: Thought  content normal.        Judgment: Judgment normal.       Assessment & Plan:  1. Type 2 diabetes mellitus with other specified complication, unspecified whether long term insulin use (HCC) - POC HgB A1c- 7.6 - has increased and not at goal. Needs to go back to taking Metformin 500 mg BID. Discussed Trulicity with the patient and he does not want any additional medications at this time.  - Follow up in 3 months   2. Obesity, morbid ([place])  - Ambulatory referral to [place], NP

## 2020-11-02 ENCOUNTER — Ambulatory Visit: Payer: Self-pay | Attending: Family

## 2020-11-02 DIAGNOSIS — Z23 Encounter for immunization: Secondary | ICD-10-CM

## 2020-11-06 ENCOUNTER — Other Ambulatory Visit (HOSPITAL_COMMUNITY): Payer: Self-pay | Admitting: Adult Health

## 2020-11-08 ENCOUNTER — Encounter (HOSPITAL_COMMUNITY): Payer: Self-pay | Admitting: Cardiology

## 2020-11-30 ENCOUNTER — Other Ambulatory Visit: Payer: Self-pay | Admitting: Cardiology

## 2020-11-30 DIAGNOSIS — I509 Heart failure, unspecified: Secondary | ICD-10-CM

## 2020-11-30 NOTE — Telephone Encounter (Signed)
Multiple attempts have been made to contact pt via phone and mail. Message to pharmacy-reminding patient of follow up needed Last oV 03/25/19   Message to provider for approval to refills

## 2020-12-01 NOTE — Progress Notes (Signed)
   Covid-19 Vaccination Clinic  Name:  Vernon Reed    MRN: [id number] DOB: 1975/12/18  12/01/2020  [name] was observed post Covid-19 immunization for 15 minutes without incident. He was provided with Vaccine Information Sheet and instruction to access the V-Safe system.   [name] was instructed to call 911 with any severe reactions post vaccine: Marland Kitchen Difficulty breathing  . Swelling of face and throat  . A fast heartbeat  . A bad rash all over body  . Dizziness and weakness   Immunizations Administered    Name Date Dose VIS Date Route   Moderna Covid-19 Booster Vaccine 11/02/2020  8:30 AM 0.25 mL 04/25/2020 Intramuscular   Manufacturer: Moderna   Lot: [id number]   NDC: 14970-263-78

## 2020-12-12 ENCOUNTER — Other Ambulatory Visit: Payer: Self-pay

## 2020-12-13 ENCOUNTER — Ambulatory Visit: Payer: BC Managed Care – PPO | Admitting: Adult Health

## 2020-12-13 VITALS — BP 120/70 | HR 101 | Temp 98.3°F | Ht 72.0 in | Wt 317.0 lb

## 2020-12-13 DIAGNOSIS — I10 Essential (primary) hypertension: Secondary | ICD-10-CM | POA: Diagnosis not present

## 2020-12-13 DIAGNOSIS — E1169 Type 2 diabetes mellitus with other specified complication: Secondary | ICD-10-CM

## 2020-12-13 LAB — POCT GLYCOSYLATED HEMOGLOBIN (HGB A1C): Hemoglobin A1C: 8.1 % — AB (ref 4.0–5.6)

## 2020-12-13 NOTE — Progress Notes (Signed)
Subjective:    Patient ID: Vernon Reed, male    DOB: [birth year], 45 y.o.   MRN: [id number]  HPI 45 year old male who  has a past medical history of AKI (acute kidney injury) ([place]) (07/27/2013), Cardiomyopathy ([place]) (07/25/2013), CHF (congestive heart failure) ([place]), Chicken pox, Chronic systolic heart failure ([place]) (01/04/2014), GERD (gastroesophageal reflux disease), Headache, Heart block (07/25/2013), Hyperlipemia, Hypertension, Hypokalemia (07/26/2013), Hypoxia (12/06/2014), Obesity, morbid ([place]) (12/27/2013), OSA (obstructive sleep apnea), Sleep apnea, SOB (shortness of breath) (07/23/2013), Tachy-brady syndrome ([place]) (07/26/2013), Tachycardia (07/23/2013), and Type II diabetes mellitus ([place]).  He presents to the office today for 42-monthfollow-up regarding diabetes mellitus and HTN.  During his last visit he was taking metformin daily instead of twice daily and had noticed that his blood sugar readings continue to be around the 180s.  His diet has been poor and had been drinking more soda, he was not exercising at the time.  He continues to take his metformin daily and his diet has not changed much. He has meet with bariatric surgery and is considering gastric sleeve  Wt Readings from Last 3 Encounters:  12/13/20 (!) 317 lb (143.8 kg)  09/12/20 (!) 318 lb 2 oz (144.3 kg)  02/06/20 (!) 307 lb (139.3 kg)    His A1c had increased from 6.2-7.6.  HTN -currently prescribed Coreg 12.5 mg twice daily, Entresto 97-103 mg daily, Lasix 40 mg daily, hydralazine 75 mg 3 times daily,  and Aldactone 25 mg daily.  He denies issues with dizziness, lightheadedness, chest pain, or shortness of breath  BP Readings from Last 3 Encounters:  12/13/20 120/70  09/12/20 110/80  02/06/20 110/80    Review of Systems See HPI   Past Medical History:  Diagnosis Date   AKI (acute kidney injury) ([place] 07/27/2013   Cardiomyopathy ([place] 07/25/2013   CHF (congestive heart failure) ([place]    Chicken pox    Chronic  systolic heart failure ([place] 01/04/2014   GERD (gastroesophageal reflux disease)    Headache    "maybe 5 times/yr; nothing regular" (12/06/2014)   Heart block 07/25/2013   Hyperlipemia    Hypertension    Hypokalemia 07/26/2013   Hypoxia 12/06/2014   Obesity, morbid ([place] 12/27/2013   OSA (obstructive sleep apnea)    Sleep apnea    SOB (shortness of breath) 07/23/2013   Tachy-brady syndrome ([place] 07/26/2013   Tachycardia 07/23/2013   Type II diabetes mellitus ([place]     Social History   Socioeconomic History   Marital status: Divorced    Spouse name: Not on file   Number of children: 3   Years of education: Not on file   Highest education level: Not on file  Occupational History   Occupation: pHerbalist [employer]  Tobacco Use   Smoking status: Never   Smokeless tobacco: Never  Vaping Use   Vaping Use: Never used  Substance and Sexual Activity   Alcohol use: No    Comment: 12/06/2014 "might have a beer a couple times/month"   Drug use: No   Sexual activity: Yes  Other Topics Concern   Not on file  Social History Narrative   BLibrarian, 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   APPENDECTOMY  ~ [place]  07/2013   LEFT AND RIGHT HEART CATHETERIZATION WITH CORONARY ANGIOGRAM N/A 07/26/2013   Procedure: LEFT AND RIGHT HEART CATHETERIZATION WITH CORONARY ANGIOGRAM;  Surgeon: Leonie Man, MD;  Location: [facility] CATH LAB;  Service: Cardiovascular;  Laterality: N/A;    Family History  Problem Relation Age of Onset   Hypertension Mother     Hypertension Father    Sleep apnea Father     No Known Allergies  Current Outpatient Medications on File Prior to Visit  Medication Sig Dispense Refill   aspirin 81 MG EC tablet Take 1 tablet (81 mg total) by mouth daily. 30 tablet 6   Blood Glucose Monitoring Suppl (TRUE METRIX METER) W/DEVICE KIT 1 each by Does not apply route once as needed. 1 kit 0   carvedilol (COREG) 12.5 MG tablet Take 1.5 tablets (18.75 mg total) by mouth 2 (two) times daily with a meal. LAST refill without office visit please call office [phone] 90 tablet 0   chlorpheniramine-HYDROcodone (TUSSIONEX PENNKINETIC ER) 10-8 MG/5ML SUER Take 5 mLs by mouth every 12 (twelve) hours as needed for cough. 115 mL 0   furosemide (LASIX) 40 MG tablet Take 1 tablet (40 mg total) by mouth daily as needed. 90 tablet 2   glucose blood (TRUE METRIX BLOOD GLUCOSE TEST) test strip Used 3 times daily before meals. 100 each 12   hydrALAZINE (APRESOLINE) 50 MG tablet TAKE 1&1/2 TABLET 3 TIMES DAILY 135 tablet 5   ibuprofen (ADVIL) 600 MG tablet Take 600 mg by mouth 3 (three) times daily.     isosorbide mononitrate (IMDUR) 30 MG 24 hr tablet Take 1 tablet (30 mg total) by mouth daily. 90 tablet 2   metFORMIN (GLUCOPHAGE) 500 MG tablet TAKE 1 TABLET BY MOUTH TWICE A DAY WITH FOOD 180 tablet 1   potassium chloride (KLOR-CON) 10 MEQ tablet TAKE 1 TABLET (10 MEQ TOTAL) BY MOUTH DAILY. NEEDS APPT FOR FUTURE REFILLS 90 tablet 0   sacubitril-valsartan (ENTRESTO) 97-103 MG Take 1 tablet by mouth 2 (two) times daily. 180 tablet 3   spironolactone (ALDACTONE) 25 MG tablet TAKE 1 TABLET BY MOUTH EVERY DAY 90 tablet 2   TRUEPLUS LANCETS 30G MISC 1 each by Does not apply route every morning. 50 each 11   No current facility-administered medications on file prior to visit.    BP 120/70   Pulse (!) 101   Temp 98.3 F (36.8 C) (Oral)   Ht 6' (1.829 m)   Wt (!) 317 lb (143.8 kg)   SpO2 93%   BMI 42.99 kg/m       Objective:   Physical  Exam Constitutional:      Appearance: Normal appearance. He is obese.  Cardiovascular:     Rate and Rhythm: Normal rate and regular rhythm.     Pulses: Normal pulses.     Heart sounds: Normal heart sounds.  Pulmonary:     Effort: Pulmonary effort is normal.     Breath sounds: Normal breath sounds.  Skin:    General: Skin is warm.  Neurological:     General: No focal deficit present.     Mental Status: He is alert and oriented to person, place, and time.  Psychiatric:        Mood  and Affect: Mood normal.        Behavior: Behavior normal.        Thought Content: Thought content normal.       Assessment & Plan:   1. Type 2 diabetes mellitus with other specified complication, unspecified whether long term insulin use (HCC) - POC HgB A1c- 8.1 - has increased - Stressed the importance of taking his medications as directed - Needs to clean up his diet - Follow up in 3 months for CPE   2. Essential hypertension - Controlled. No change   Dorothyann Peng, NP

## 2021-01-01 ENCOUNTER — Other Ambulatory Visit: Payer: Self-pay | Admitting: Family

## 2021-01-01 MED ORDER — INDOMETHACIN 50 MG PO CAPS: 50.0000 mg | ORAL_CAPSULE | Freq: Three times a day (TID) | ORAL | 0 refills | Status: DC

## 2021-01-08 ENCOUNTER — Encounter: Payer: Self-pay | Admitting: Adult Health

## 2021-01-08 ENCOUNTER — Ambulatory Visit: Payer: BC Managed Care – PPO | Admitting: Adult Health

## 2021-01-08 VITALS — Ht 72.0 in | Wt 317.0 lb

## 2021-01-08 DIAGNOSIS — E1169 Type 2 diabetes mellitus with other specified complication: Secondary | ICD-10-CM

## 2021-01-08 MED ORDER — TRULICITY 0.75 MG/0.5ML ~~LOC~~ SOAJ: 0.7500 mg | SUBCUTANEOUS | 0 refills | Status: DC

## 2021-01-08 NOTE — Progress Notes (Signed)
   Subjective:    Patient ID: Vernon Reed, male    DOB: 27-Apr-1976, 45 y.o.   MRN: [id number]  HPI    Review of Systems     Objective:   Physical Exam        Assessment & Plan:

## 2021-01-08 NOTE — Progress Notes (Signed)
Virtual Visit via Video Note  I connected with Vernon Reed on 01/08/21 at  5:00 PM EDT by a video enabled telemedicine application and verified that I am speaking with the correct person using two identifiers.  Location patient: home Location provider:work or home office Persons participating in the virtual visit: patient, provider  I discussed the limitations of evaluation and management by telemedicine and the availability of in person appointments. The patient expressed understanding and agreed to proceed.   HPI:  45 year old male who  has a past medical history of AKI (acute kidney injury) ([place]) (07/27/2013), Cardiomyopathy ([place]) (07/25/2013), CHF (congestive heart failure) ([place]), Chicken pox, Chronic systolic heart failure ([place]) (01/04/2014), GERD (gastroesophageal reflux disease), Headache, Heart block (07/25/2013), Hyperlipemia, Hypertension, Hypokalemia (07/26/2013), Hypoxia (12/06/2014), Obesity, morbid ([place]) (12/27/2013), OSA (obstructive sleep apnea), Sleep apnea, SOB (shortness of breath) (07/23/2013), Tachy-brady syndrome ([place]) (07/26/2013), Tachycardia (07/23/2013), and Type II diabetes mellitus ([place]).  He is being evaluated today regarding hyperglycemia and diabetes mellitus.  He was seen last month for his routine diabetic follow-up which time his A1c had increased to 8.1.  He was not taking his medications as directed and his diet was extremely poor, eating a lot of carbs, sugars, drinking soda and was not exercising.  He was encouraged to take his medication as directed and clean up his diet.  Sounds like he failed to do so, over the last couple weeks he has been feeling "really bad", symptoms include fatigue, frequent urination, stream thirst, and weight loss of about 15 pounds for the last 2 weeks.  He works at the [employer] at [employer], he had his blood sugar checked late last week with a reading of 489.  He reports that he was given multiple bags of IV fluids as well as a couple of  injections of insulin.  Since that time his diet has improved, eating more  salads and drinking water.  He has not had any soda since.  This morning when he checked his blood sugar it was 453.  Had him check his blood sugar during this visit and it was 234.  ROS: See pertinent positives and negatives per HPI.  Past Medical History:  Diagnosis Date   AKI (acute kidney injury) ([place]) 07/27/2013   Cardiomyopathy ([place]) 07/25/2013   CHF (congestive heart failure) ([place])    Chicken pox    Chronic systolic heart failure ([place]) 01/04/2014   GERD (gastroesophageal reflux disease)    Headache    "maybe 5 times/yr; nothing regular" (12/06/2014)   Heart block 07/25/2013   Hyperlipemia    Hypertension    Hypokalemia 07/26/2013   Hypoxia 12/06/2014   Obesity, morbid ([place]) 12/27/2013   OSA (obstructive sleep apnea)    Sleep apnea    SOB (shortness of breath) 07/23/2013   Tachy-brady syndrome ([place]) 07/26/2013   Tachycardia 07/23/2013   Type II diabetes mellitus ([place])     Past Surgical History:  Procedure Laterality Date   ABDOMINAL SURGERY  ~ 1987   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   APPENDECTOMY  ~ [place]  07/2013   LEFT AND RIGHT HEART CATHETERIZATION WITH CORONARY ANGIOGRAM N/A 07/26/2013   Procedure: LEFT AND RIGHT HEART CATHETERIZATION WITH CORONARY ANGIOGRAM;  Surgeon: Leonie Man, MD;  Location[facility] C[facility] LAB;  Service: Cardiovascular;  Laterality: N/A;    Family History  Problem Relation Age of Onset   Hypertension Mother    Hypertension Father    Sleep apnea Father  Current Outpatient Medications:    aspirin 81 MG EC tablet, Take 1 tablet (81 mg total) by mouth daily., Disp: 30 tablet, Rfl: 6   Blood Glucose Monitoring Suppl (TRUE METRIX METER) W/DEVICE KIT, 1 each by Does not apply route once as needed., Disp: 1 kit, Rfl: 0   carvedilol (COREG) 12.5 MG tablet, Take 1.5 tablets (18.75 mg total) by mouth 2 (two) times daily with a meal. LAST refill  without office visit please call office [phone], Disp: 90 tablet, Rfl: 0   chlorpheniramine-HYDROcodone (TUSSIONEX PENNKINETIC ER) 10-8 MG/5ML SUER, Take 5 mLs by mouth every 12 (twelve) hours as needed for cough., Disp: 115 mL, Rfl: 0   furosemide (LASIX) 40 MG tablet, Take 1 tablet (40 mg total) by mouth daily as needed., Disp: 90 tablet, Rfl: 2   glucose blood (TRUE METRIX BLOOD GLUCOSE TEST) test strip, Used 3 times daily before meals., Disp: 100 each, Rfl: 12   hydrALAZINE (APRESOLINE) 50 MG tablet, TAKE 1&1/2 TABLET 3 TIMES DAILY, Disp: 135 tablet, Rfl: 5   indomethacin (INDOCIN) 50 MG capsule, Take 1 capsule (50 mg total) by mouth 3 (three) times daily with meals., Disp: 90 capsule, Rfl: 0   isosorbide mononitrate (IMDUR) 30 MG 24 hr tablet, Take 1 tablet (30 mg total) by mouth daily., Disp: 90 tablet, Rfl: 2   metFORMIN (GLUCOPHAGE) 500 MG tablet, TAKE 1 TABLET BY MOUTH TWICE A DAY WITH FOOD, Disp: 180 tablet, Rfl: 1   potassium chloride (KLOR-CON) 10 MEQ tablet, TAKE 1 TABLET (10 MEQ TOTAL) BY MOUTH DAILY. NEEDS APPT FOR FUTURE REFILLS, Disp: 90 tablet, Rfl: 0   sacubitril-valsartan (ENTRESTO) 97-103 MG, Take 1 tablet by mouth 2 (two) times daily., Disp: 180 tablet, Rfl: 3   spironolactone (ALDACTONE) 25 MG tablet, TAKE 1 TABLET BY MOUTH EVERY DAY, Disp: 90 tablet, Rfl: 2   TRUEPLUS LANCETS 30G MISC, 1 each by Does not apply route every morning., Disp: 50 each, Rfl: 11  EXAM:  VITALS per patient if applicable:  GENERAL: alert, oriented, appears well and in no acute distress  HEENT: atraumatic, conjunttiva clear, no obvious abnormalities on inspection of external nose and ears  NECK: normal movements of the head and neck  LUNGS: on inspection no signs of respiratory distress, breathing rate appears normal, no obvious gross SOB, gasping or wheezing  CV: no obvious cyanosis  MS: moves all visible extremities without noticeable abnormality  PSYCH/NEURO: pleasant and  cooperative, no obvious depression or anxiety, speech and thought processing grossly intact  ASSESSMENT AND PLAN:  Discussed the following assessment and plan:  1. Type 2 diabetes mellitus with other specified complication, unspecified whether long term insulin use ([place]) -Discussed the importance of diabetic diet and frequent exercise.  He needs to start working diligently on lifestyle modifications.  We will have him increase his metformin dose to 1000 mg twice daily and start him on Trulicity.  To monitor blood sugars periodically throughout the day.  Follow-up if blood sugars continue to be in the 300-400's - Dulaglutide (TRULICITY) 9.75 OI/3.2PQ SOPN; Inject 0.75 mg into the skin once a week.  Dispense: 6 mL; Refill: 0  Time spent on chart review, time with patient; discussion of diabetes home monitoring,treatment, side effects of new medication follow up plan, and documentation 30 minutes     I discussed the assessment and treatment plan with the patient. The patient was provided an opportunity to ask questions and all were answered. The patient agreed with the plan and demonstrated an understanding  of the instructions.   The patient was advised to call back or seek an in-person evaluation if the symptoms worsen or if the condition fails to improve as anticipated.   Dorothyann Peng, NP

## 2021-01-11 ENCOUNTER — Encounter: Payer: Self-pay | Admitting: Adult Health

## 2021-01-11 NOTE — Telephone Encounter (Signed)
Please advise 

## 2021-01-11 NOTE — Telephone Encounter (Signed)
FYI

## 2021-01-25 ENCOUNTER — Other Ambulatory Visit: Payer: Self-pay | Admitting: Cardiovascular Disease

## 2021-01-28 ENCOUNTER — Other Ambulatory Visit: Payer: Self-pay | Admitting: Family

## 2021-02-01 ENCOUNTER — Other Ambulatory Visit (HOSPITAL_COMMUNITY): Payer: Self-pay | Admitting: Cardiology

## 2021-02-13 ENCOUNTER — Other Ambulatory Visit: Payer: Self-pay

## 2021-02-13 ENCOUNTER — Telehealth: Payer: Self-pay | Admitting: Adult Health

## 2021-02-13 NOTE — Telephone Encounter (Signed)
Pt call and need a refill on carvedilol (COREG) 12.5 MG tablet sent to  [id number] - [place], [place] - [address] Phone:  [phone]  Fax:  [phone]

## 2021-02-13 NOTE — Telephone Encounter (Signed)
Spoke to pt and advise of message below a swell as message on Rx. Pt verbalized understanding.

## 2021-02-13 NOTE — Telephone Encounter (Signed)
Called pt no answer. Pt needs to get this from his cardiologist. Looks like pt is due for ov and note in Rx states that pt needs a f/u appt.

## 2021-02-15 ENCOUNTER — Telehealth (HOSPITAL_COMMUNITY): Payer: Self-pay | Admitting: Internal Medicine

## 2021-02-18 ENCOUNTER — Other Ambulatory Visit: Payer: Self-pay | Admitting: *Deleted

## 2021-02-18 ENCOUNTER — Telehealth: Payer: Self-pay | Admitting: Cardiovascular Disease

## 2021-02-18 MED ORDER — CARVEDILOL 12.5 MG PO TABS: 18.7500 mg | ORAL_TABLET | Freq: Two times a day (BID) | ORAL | 3 refills | Status: DC

## 2021-02-18 NOTE — Telephone Encounter (Signed)
*  STAT* If patient is at the pharmacy, call can be transferred to refill team.   1. Which medications need to be refilled? (please list name of each medication and dose if known) carvedilol (COREG) 12.5 MG tablet  2. Which pharmacy/location (including street and city if local pharmacy) is medication to be sent to? [id number] - [place], [place] - [address]  3. Do they need a 30 day or 90 day supply? 90  Patient is out of medication

## 2021-03-08 ENCOUNTER — Other Ambulatory Visit: Payer: Self-pay

## 2021-03-08 ENCOUNTER — Telehealth: Payer: Self-pay | Admitting: Adult Health

## 2021-03-08 ENCOUNTER — Encounter: Payer: Self-pay | Admitting: Adult Health

## 2021-03-08 ENCOUNTER — Ambulatory Visit (INDEPENDENT_AMBULATORY_CARE_PROVIDER_SITE_OTHER): Payer: BC Managed Care – PPO | Admitting: Adult Health

## 2021-03-08 VITALS — BP 120/90 | HR 80 | Temp 97.8°F | Ht 72.0 in | Wt 304.0 lb

## 2021-03-08 DIAGNOSIS — I10 Essential (primary) hypertension: Secondary | ICD-10-CM

## 2021-03-08 DIAGNOSIS — E1169 Type 2 diabetes mellitus with other specified complication: Secondary | ICD-10-CM

## 2021-03-08 DIAGNOSIS — Z0000 Encounter for general adult medical examination without abnormal findings: Secondary | ICD-10-CM | POA: Diagnosis not present

## 2021-03-08 DIAGNOSIS — I5022 Chronic systolic (congestive) heart failure: Secondary | ICD-10-CM

## 2021-03-08 DIAGNOSIS — Z1211 Encounter for screening for malignant neoplasm of colon: Secondary | ICD-10-CM

## 2021-03-08 DIAGNOSIS — G4733 Obstructive sleep apnea (adult) (pediatric): Secondary | ICD-10-CM

## 2021-03-08 DIAGNOSIS — Z1159 Encounter for screening for other viral diseases: Secondary | ICD-10-CM

## 2021-03-08 LAB — CBC WITH DIFFERENTIAL/PLATELET
Basophils Absolute: 0 10*3/uL (ref 0.0–0.1)
Basophils Relative: 0.4 % (ref 0.0–3.0)
Eosinophils Absolute: 0.2 10*3/uL (ref 0.0–0.7)
Eosinophils Relative: 3 % (ref 0.0–5.0)
HCT: 37.5 % — ABNORMAL LOW (ref 39.0–52.0)
Hemoglobin: 12.5 g/dL — ABNORMAL LOW (ref 13.0–17.0)
Lymphocytes Relative: 31.4 % (ref 12.0–46.0)
Lymphs Abs: 2.6 10*3/uL (ref 0.7–4.0)
MCHC: 33.2 g/dL (ref 30.0–36.0)
MCV: 91.4 fl (ref 78.0–100.0)
Monocytes Absolute: 0.5 10*3/uL (ref 0.1–1.0)
Monocytes Relative: 6.6 % (ref 3.0–12.0)
Neutro Abs: 4.8 10*3/uL (ref 1.4–7.7)
Neutrophils Relative %: 58.6 % (ref 43.0–77.0)
Platelets: 275 10*3/uL (ref 150.0–400.0)
RBC: 4.1 Mil/uL — ABNORMAL LOW (ref 4.22–5.81)
RDW: 13.7 % (ref 11.5–15.5)
WBC: 8.2 10*3/uL (ref 4.0–10.5)

## 2021-03-08 LAB — COMPREHENSIVE METABOLIC PANEL
ALT: 25 U/L (ref 0–53)
AST: 14 U/L (ref 0–37)
Albumin: 4 g/dL (ref 3.5–5.2)
Alkaline Phosphatase: 52 U/L (ref 39–117)
BUN: 15 mg/dL (ref 6–23)
CO2: 30 mEq/L (ref 19–32)
Calcium: 8.9 mg/dL (ref 8.4–10.5)
Chloride: 101 mEq/L (ref 96–112)
Creatinine, Ser: 1.07 mg/dL (ref 0.40–1.50)
GFR: 83.36 mL/min (ref >60.00)
Glucose, Bld: 88 mg/dL (ref 70–99)
Potassium: 4 mEq/L (ref 3.5–5.1)
Sodium: 140 mEq/L (ref 135–145)
Total Bilirubin: 1 mg/dL (ref 0.2–1.2)
Total Protein: 6.8 g/dL (ref 6.0–8.3)

## 2021-03-08 LAB — LIPID PANEL
Cholesterol: 157 mg/dL (ref 0–200)
HDL: 25.9 mg/dL — ABNORMAL LOW (ref >39.00)
LDL Cholesterol: 108 mg/dL — ABNORMAL HIGH (ref 0–99)
NonHDL: 130.93
Total CHOL/HDL Ratio: 6
Triglycerides: 114 mg/dL (ref 0.0–149.0)
VLDL: 22.8 mg/dL (ref 0.0–40.0)

## 2021-03-08 LAB — HEMOGLOBIN A1C: Hgb A1c MFr Bld: 7.4 % — ABNORMAL HIGH (ref 4.6–6.5)

## 2021-03-08 LAB — TSH: TSH: 2.74 u[IU]/mL (ref 0.35–5.50)

## 2021-03-08 MED ORDER — TIRZEPATIDE 5 MG/0.5ML ~~LOC~~ SOAJ: 5.0000 mg | SUBCUTANEOUS | 0 refills | Status: DC

## 2021-03-08 NOTE — Patient Instructions (Signed)
It was great seeing you today!   We will follow up with you regarding your blood work   I have switched you from Trulicity to a new medication called Mounjaro.   I will see you back in about three months

## 2021-03-08 NOTE — Telephone Encounter (Signed)
Updated patient on his labs  

## 2021-03-08 NOTE — Progress Notes (Signed)
Subjective:    Patient ID: Vernon Reed, male    DOB: 05/09/1976, 45 y.o.   MRN: [id number]  HPI Patient presents for yearly preventative medicine examination. He is a pleasant 45 year old male who  has a past medical history of AKI (acute kidney injury) ([place]) (07/27/2013), Cardiomyopathy ([place]) (07/25/2013), CHF (congestive heart failure) ([place]), Chicken pox, Chronic systolic heart failure ([place]) (01/04/2014), GERD (gastroesophageal reflux disease), Headache, Heart block (07/25/2013), Hyperlipemia, Hypertension, Hypokalemia (07/26/2013), Hypoxia (12/06/2014), Obesity, morbid ([place]) (12/27/2013), OSA (obstructive sleep apnea), Sleep apnea, SOB (shortness of breath) (07/23/2013), Tachy-brady syndrome ([place]) (07/26/2013), Tachycardia (07/23/2013), and Type II diabetes mellitus ([place]).  DM -currently managed with metformin 500 mg twice daily and Trulicity 4.09 mg daily.  We have had issues with medication noncompliance as well as poor diet in the past. Today he reports that his diet " is good but not bad". He is getting to the gym 2 times a week and he plans on increasing to 3 times a week. His blood sugars have been in the 80-103. He denies episodes of hypoglycemia.  Lab Results  Component Value Date   HGBA1C 8.1 (A) 12/13/2020   OSA -has CPAP. Is not wearing, he needs a new adaptor. Cardiology manages. Has an appointment in November.   Hypertension -managed by cardiology.  Currently prescribed Coreg 12.5 mg twice daily, Entresto 97-103 mg daily, Lasix 40 mg daily, hydralazine 75 mg 3 times daily, Aldactone 25 mg daily, and Imdur 30 mg daily.  He denies issues with dizziness, lightheadedness, chest pain, or shortness of breath  BP Readings from Last 3 Encounters:  03/08/21 120/90  12/13/20 120/70  09/12/20 110/80    CHF-managed by cardiology.  He denies chest pain or shortness of breath  All immunizations and health maintenance protocols were reviewed with the patient and needed orders were  placed.  Appropriate screening laboratory values were ordered for the patient including screening of hyperlipidemia, renal function and hepatic function.   Medication reconciliation,  past medical history, social history, problem list and allergies were reviewed in detail with the patient  Goals were established with regard to weight loss, exercise, and  diet in compliance with medications Wt Readings from Last 3 Encounters:  03/08/21 (!) 304 lb (137.9 kg)  01/08/21 (!) 317 lb (143.8 kg)  12/13/20 (!) 317 lb (143.8 kg)   Refuses Colonoscopy but will do colo guard.   Review of Systems  Constitutional: Negative.   HENT: Negative.    Eyes: Negative.   Respiratory: Negative.    Cardiovascular: Negative.   Gastrointestinal: Negative.   Endocrine: Negative.   Genitourinary: Negative.   Musculoskeletal: Negative.   Skin: Negative.   Allergic/Immunologic: Negative.   Neurological: Negative.   Hematological: Negative.   Psychiatric/Behavioral: Negative.    All other systems reviewed and are negative.  Past Medical History:  Diagnosis Date   AKI (acute kidney injury) ([place]) 07/27/2013   Cardiomyopathy ([place]) 07/25/2013   CHF (congestive heart failure) ([place])    Chicken pox    Chronic systolic heart failure ([place]) 01/04/2014   GERD (gastroesophageal reflux disease)    Headache    "maybe 5 times/yr; nothing regular" (12/06/2014)   Heart block 07/25/2013   Hyperlipemia    Hypertension    Hypokalemia 07/26/2013   Hypoxia 12/06/2014   Obesity, morbid ([place]) 12/27/2013   OSA (obstructive sleep apnea)    Sleep apnea    SOB (shortness of breath) 07/23/2013   Tachy-brady syndrome ([place]) 07/26/2013   Tachycardia 07/23/2013  Type II diabetes mellitus ([place])     Social History   Socioeconomic History   Marital status: Divorced    Spouse name: Not on file   Number of children: 3   Years of education: Not on file   Highest education level: Not on file  Occupational History   Occupation: Engineer, materials: [employer]  Tobacco Use   Smoking status: Never   Smokeless tobacco: Never  Vaping Use   Vaping Use: Never used  Substance and Sexual Activity   Alcohol use: No    Comment: 12/06/2014 "might have a beer a couple times/month"   Drug use: No   Sexual activity: Yes  Other Topics Concern   Not on file  Social History Narrative   Librarian, 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       Social Determinants of Health   Financial Resource Strain: Not on file  Food Insecurity: Not on file  Transportation Needs: Not on file  Physical Activity: Not on file  Stress: Not on file  Social Connections: Not on file  Intimate Partner Violence: Not on file    Past Surgical History:  Procedure Laterality Date   ABDOMINAL SURGERY  ~ 1987   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   APPENDECTOMY  ~ [place]  07/2013   LEFT AND RIGHT HEART CATHETERIZATION WITH CORONARY ANGIOGRAM N/A 07/26/2013   Procedure: LEFT AND RIGHT HEART CATHETERIZATION WITH CORONARY ANGIOGRAM;  Surgeon: Leonie Man, MD;  Location: [facility] CATH LAB;  Service: Cardiovascular;  Laterality: N/A;    Family History  Problem Relation Age of Onset   Hypertension Mother    Hypertension Father    Sleep apnea Father     No Known Allergies  Current Outpatient Medications on File Prior to Visit  Medication Sig Dispense Refill   aspirin 81 MG EC tablet Take 1 tablet (81 mg total) by mouth daily. 30 tablet 6   Blood Glucose Monitoring Suppl (TRUE METRIX METER) W/DEVICE KIT 1 each by Does not apply route once as needed. 1 kit 0   carvedilol (COREG) 12.5 MG tablet Take 1.5 tablets (18.75 mg total) by mouth 2 (two) times daily with a meal. LAST refill without office visit please call office [phone] 90 tablet 3   Dulaglutide (TRULICITY) 9.38 HW/2.9HB SOPN Inject 0.75 mg into the skin once a week. 6 mL 0   ENTRESTO 97-103 MG TAKE 1 TABLET BY MOUTH 2 (TWO) TIMES DAILY. 180 tablet 6    furosemide (LASIX) 40 MG tablet TAKE 1 TABLET BY MOUTH DAILY AS NEEDED 90 tablet 0   glucose blood (TRUE METRIX BLOOD GLUCOSE TEST) test strip Used 3 times daily before meals. 100 each 12   hydrALAZINE (APRESOLINE) 50 MG tablet TAKE 1&1/2 TABLET 3 TIMES DAILY 135 tablet 5   isosorbide mononitrate (IMDUR) 30 MG 24 hr tablet Take 1 tablet (30 mg total) by mouth daily. 90 tablet 2   metFORMIN (GLUCOPHAGE) 500 MG tablet TAKE 1 TABLET BY MOUTH TWICE A DAY WITH FOOD 180 tablet 1   potassium chloride (KLOR-CON) 10 MEQ tablet TAKE 1 TABLET (10 MEQ TOTAL) BY MOUTH DAILY. NEEDS APPT FOR FUTURE REFILLS 90 tablet 0   spironolactone (ALDACTONE) 25 MG tablet TAKE 1 TABLET BY MOUTH EVERY DAY 90 tablet 2   TRUEPLUS LANCETS 30G MISC 1 each by Does not apply route every morning. 50 each 11   No current facility-administered medications on file prior  to visit.    BP 120/90 (BP Location: Left Arm, Patient Position: Sitting, Cuff Size: Large)   Pulse 80   Temp 97.8 F (36.6 C) (Oral)   Ht 6' (1.829 m)   Wt (!) 304 lb (137.9 kg)   SpO2 97%   BMI 41.23 kg/m         Objective:   Physical Exam Vitals and nursing note reviewed.  Constitutional:      Appearance: Normal appearance.  Cardiovascular:     Rate and Rhythm: Normal rate and regular rhythm.     Pulses: Normal pulses.     Heart sounds: Normal heart sounds.  Pulmonary:     Effort: Pulmonary effort is normal.     Breath sounds: Normal breath sounds.  Musculoskeletal:        General: Normal range of motion.  Skin:    General: Skin is warm and dry.  Neurological:     General: No focal deficit present.     Mental Status: He is alert and oriented to person, place, and time.  Psychiatric:        Mood and Affect: Mood normal.        Behavior: Behavior normal.        Thought Content: Thought content normal.        Judgment: Judgment normal.      Assessment & Plan:  1. Routine general medical examination at a health care facility - Continue  with lifestyle modifications  - Follow up in one year or sooner if needed - CBC with Differential/Platelet; Future - Comprehensive metabolic panel; Future - Hemoglobin A1c; Future - Lipid panel; Future - TSH; Future  2. Type 2 diabetes mellitus with other specified complication, unspecified whether long term insulin use (HCC) - D/c Trulicity; will trial him on Mounjaro.  - Follow up in three months  - tirzepatide (MOUNJARO) 5 MG/0.5ML Pen; Inject 5 mg into the skin once a week.  Dispense: 6 mL; Refill: 0 - CBC with Differential/Platelet; Future - Comprehensive metabolic panel; Future - Hemoglobin A1c; Future - Lipid panel; Future - TSH; Future  3. Essential hypertension - Controlled.  - CBC with Differential/Platelet; Future - Comprehensive metabolic panel; Future - Hemoglobin A1c; Future - Lipid panel; Future - TSH; Future  4. Obesity, morbid (HCC) - Continue to work on weight loss  - 17 pound weight loss in the last 2 months  - CBC with Differential/Platelet; Future - Comprehensive metabolic panel; Future - Hemoglobin A1c; Future - Lipid panel; Future - TSH; Future  5. Chronic systolic heart failure (HCC) - Follow up with Cardiology as directed - CBC with Differential/Platelet; Future - Comprehensive metabolic panel; Future - Hemoglobin A1c; Future - Lipid panel; Future - TSH; Future  6. OSA (obstructive sleep apnea) - Follow up with Cardiology as directed - CBC with Differential/Platelet; Future - Comprehensive metabolic panel; Future - Hemoglobin A1c; Future - Lipid panel; Future - TSH; Future  7. Need for hepatitis C screening test  - Hep C Antibody; Future  8. Colon cancer screening  - Cologuard  Cory Nafziger, NP  

## 2021-03-08 NOTE — Addendum Note (Signed)
Addended by: Kandra Nicolas on: 03/08/2021 08:08 AM   Modules accepted: Orders

## 2021-03-12 LAB — HEPATITIS C ANTIBODY
Hepatitis C Ab: NONREACTIVE
SIGNAL TO CUT-OFF: 0.03 (ref <1.00)

## 2021-03-13 ENCOUNTER — Telehealth: Payer: Self-pay | Admitting: Adult Health

## 2021-03-13 NOTE — Telephone Encounter (Signed)
Patient called today stating that he was told by his pharmacy that his prescription for tirzepatide [facility]) 5 MG/0.5ML Pen needs a prior authorization.  Patient would like a prior authorization for this medication.  Please advise.

## 2021-03-14 NOTE — Telephone Encounter (Signed)
PA has been started    [employer] Key: [id number] - PA Case ID: [id number] Need help? Call us at [phone] Status Sent to [employer] [facility] Mounjaro 5MG /0.5ML pen-injectors Form PA Form [phone] NCPDP

## 2021-03-15 NOTE — Telephone Encounter (Signed)
Addison Lank KeyMaud Deed - PA Case ID: [id number] Need help? Call us at [phone] Outcome Approvedon September 8 Your PA request has been approved. Additional information will be provided in the approval communication. (Message 1145) Drug Mounjaro 5MG /0.5ML pen-injectors Form PA Form [phone] NCPDP)

## 2021-03-15 NOTE — Telephone Encounter (Signed)
Patient notified of update  and verbalized understanding. 

## 2021-05-01 ENCOUNTER — Other Ambulatory Visit: Payer: Self-pay | Admitting: Cardiology

## 2021-05-01 ENCOUNTER — Other Ambulatory Visit: Payer: Self-pay | Admitting: Cardiovascular Disease

## 2021-05-01 ENCOUNTER — Encounter (HOSPITAL_COMMUNITY): Payer: BC Managed Care – PPO | Admitting: Internal Medicine

## 2021-05-01 DIAGNOSIS — I509 Heart failure, unspecified: Secondary | ICD-10-CM

## 2021-05-01 DIAGNOSIS — I10 Essential (primary) hypertension: Secondary | ICD-10-CM

## 2021-05-01 DIAGNOSIS — I5022 Chronic systolic (congestive) heart failure: Secondary | ICD-10-CM

## 2021-05-09 ENCOUNTER — Other Ambulatory Visit: Payer: Self-pay

## 2021-05-09 DIAGNOSIS — E1165 Type 2 diabetes mellitus with hyperglycemia: Secondary | ICD-10-CM

## 2021-05-09 MED ORDER — METFORMIN HCL 500 MG PO TABS: 500.0000 mg | ORAL_TABLET | Freq: Two times a day (BID) | ORAL | 0 refills | Status: DC

## 2021-05-20 ENCOUNTER — Encounter: Payer: Self-pay | Admitting: Cardiovascular Disease

## 2021-05-20 NOTE — Progress Notes (Signed)
Cardiology Office Note:    Date:  05/21/2021   ID:  Vernon Reed, DOB 1976-06-02, MRN [id number]  PCP:  Dorothyann Peng, NP  Cardiologist:  Wyvonne Lenz, previous CHF clinic  Electrophysiologist:  None   Referring MD: Dorothyann Peng, NP   Chief Complaint  Patient presents with   Congestive Heart Failure         April 14, 2019   Vernon Reed is a 45 y.o. male with a hx of chronic systolic congestive heart failure.  He has been seen by the heart failure clinic for the past several years and his ejection fraction is now normal.  He has moderate left ventricular hypertrophy.  He has grade 1 diastolic dysfunction.  Takes his meds,  Tries to avoid salt .  No CP  Able to do his normal activties  has been getting some exercise  Knows that he needs to lose some weight .  Has OSA , he wears a CPAP mask. Heart cath in 2015 revealed normal coronary arteries.  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 He has a hx of obesity .   Wt today is 307 lbs.  Has not lost any weight .    Busy with the band  Sings with [employer] for past several years  Still with [employer] also Is not workint out.    Eats unhealthy foods frequently  - after playing a jig, only fast foods are available  We discussed the fact that as he lost weight we could gradually cut back on some of the medications.  He needs to work on weight loss and improvement of his diet before we start cutting back on his medications.  May 21 2021:   Vernon Reed is seen today for follow-up visit for his congestive heart failure.  He has been eating lots of salty foods.  He plays in a band and does not always eat the healthiest food. Wt is 305 lbs.  Works at [employer] in a band  ([employer] ) - vocals, [place],  Very busy  Playing with [place] [employer] on [place] 10 in [place] No CP or dyspnea.  .  Getting  some exercise  On coreg, entresto, hydralazine  Takes the lasix PRN, - perhaps QOD   Cardiology Office Note:    Date:  05/21/2021   ID:  Vernon Reed, DOB 29-Jul-1975, MRN [id number]  PCP:  Dorothyann Peng, NP  Cardiologist:  Wyvonne Lenz, previous CHF clinic  Electrophysiologist:  None   Referring MD: Dorothyann Peng, NP   Chief Complaint  Patient presents with   Congestive Heart Failure         April 14, 2019   Vernon Reed is a 45 y.o. male with a hx of chronic systolic congestive heart failure.  He has been seen by the heart failure clinic for the past several years and his ejection fraction is now normal.  He has moderate left ventricular hypertrophy.  He has grade 1 diastolic dysfunction.  Takes his meds,  Tries to avoid salt .  No CP  Able to do his normal activties  has been getting some exercise  Knows that he needs to lose some weight .  Has OSA , he wears a CPAP mask. Heart cath in 2015 revealed normal coronary arteries.  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 He has a hx of obesity .   Wt today is 307 lbs.  Has not lost any weight .    Busy with the band  Sings with [employer] for past several years  Still with [employer] also Is not workint out.    Eats unhealthy foods frequently  - after playing a jig, only fast foods are available  We discussed the fact that as he lost weight we could gradually cut back on some of the medications.  He needs to work on weight loss and improvement of his diet before we start cutting back on his medications.   Past Medical History:  Diagnosis Date   AKI (acute kidney injury) ([place]) 07/27/2013   Cardiomyopathy ([place]) 07/25/2013   CHF (congestive heart failure) ([place])    Chicken pox    Chronic systolic heart failure ([place]) 01/04/2014   GERD (gastroesophageal reflux disease)    Headache    "maybe 5  times/yr; nothing regular" (12/06/2014)   Heart block 07/25/2013   Hyperlipemia    Hypertension    Hypokalemia 07/26/2013   Hypoxia 12/06/2014   Obesity, morbid ([place]) 12/27/2013   OSA (obstructive sleep apnea)    Sleep apnea    SOB (shortness of breath) 07/23/2013   Tachy-brady syndrome ([place]) 07/26/2013   Tachycardia 07/23/2013   Type II diabetes mellitus ([place])     Past Surgical History:  Procedure Laterality Date   ABDOMINAL SURGERY  ~ 1987   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   APPENDECTOMY  ~ [place]  07/2013   LEFT AND RIGHT HEART CATHETERIZATION WITH CORONARY ANGIOGRAM N/A 07/26/2013   Procedure: LEFT AND RIGHT HEART CATHETERIZATION WITH CORONARY ANGIOGRAM;  Surgeon: Leonie Man, MD;  Location: [facility] CATH LAB;  Service: Cardiovascular;  Laterality: N/A;    Current Medications: Current Meds  Medication Sig   aspirin 81 MG EC tablet Take 1 tablet (81 mg total) by mouth daily.   Blood Glucose Monitoring Suppl (TRUE METRIX METER) W/DEVICE KIT 1 each by Does not apply route once as needed.   carvedilol (COREG) 12.5 MG tablet Take 1.5 tablets (18.75 mg total) by mouth 2 (two) times daily with a meal. LAST refill without office visit please call office [phone]   ENTRESTO 97-103 MG TAKE 1 TABLET BY MOUTH 2 (TWO) TIMES DAILY.   furosemide (LASIX) 40 MG tablet TAKE 1 TABLET BY MOUTH EVERY DAY AS NEEDED   glucose blood (TRUE METRIX BLOOD GLUCOSE TEST) test strip Used 3 times daily before meals.   hydrALAZINE (APRESOLINE) 50 MG tablet TAKE 1&1/2 TABLET 3 TIMES DAILY   isosorbide mononitrate (IMDUR) 30 MG 24 hr tablet TAKE 1 TABLET BY MOUTH EVERY DAY   metFORMIN (GLUCOPHAGE) 500 MG tablet Take 1 tablet (500 mg total) by mouth 2 (two) times daily with a meal. Please keep upcoming appt. With Dr. Acie Fredrickson in Nov. In order to receive further refills.   potassium chloride (KLOR-CON) 10 MEQ tablet TAKE 1 TABLET (10 MEQ TOTAL) BY MOUTH DAILY. NEEDS APPT FOR FUTURE REFILLS    spironolactone (ALDACTONE) 25 MG tablet TAKE 1 TABLET BY MOUTH EVERY DAY   tirzepatide (MOUNJARO) 5 MG/0.5ML Pen Inject 5 mg into the skin once a week.   TRUEPLUS LANCETS 30G MISC 1 each by Does not apply route every morning.     Allergies:   Patient has no known allergies.  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       Social Determinants of Health   Financial Resource Strain: Not on file  Food Insecurity: Not on file  Transportation Needs: Not on file  Physical Activity: Not on file  Stress: Not on file  Social Connections: Not on file     Family History: The patient's family history includes Hypertension in his father and mother; Sleep apnea in his father.  ROS:   Please see the history of present illness.     All other systems reviewed and are negative.  EKGs/Labs/Other Studies Reviewed:    The following studies were reviewed today:   EKG:   May 21, 2021: Normal sinus rhythm at 83.  T wave inversions in the inferior leads.  Recent Labs: 03/08/2021: ALT 25; BUN 15; Creatinine, Ser 1.07; Hemoglobin 12.5; Platelets 275.0; Potassium 4.0; Sodium 140; TSH 2.74  Recent Lipid Panel    Component Value Date/Time   CHOL 157 03/08/2021 0808   TRIG 114.0 03/08/2021 0808   HDL 25.90 (L) 03/08/2021 0808   CHOLHDL 6 03/08/2021 0808   VLDL 22.8 03/08/2021 0808   LDLCALC  108 (H) 03/08/2021 0808   LDLCALC 111 (H) 02/02/2020 0836    Physical Exam:    Physical Exam: Blood pressure 128/74, pulse 83, height 6' (1.829 m), weight (!) 305 lb 3.2 oz (138.4 kg), SpO2 97 %.  GEN:  Young man,  Morbidly obese.   HEENT: Normal NECK: No JVD; No carotid bruits LYMPHATICS: No lymphadenopathy CARDIAC: RRR , no murmurs, rubs, gallops RESPIRATORY:  Clear to auscultation without rales, wheezing or rhonchi  ABDOMEN: Soft, non-tender, non-distended MUSCULOSKELETAL:  No edema; No deformity  SKIN: Warm and dry NEUROLOGIC:  Alert and oriented x 3    ASSESSMENT:    1. Chronic systolic heart failure (HCC)    PLAN:    In order of problems listed above:  1.  History of chronic systolic congestive heart failure:   Vernon Reed seems to be doing well.  Heart rate failure is under good control.  He needs to continue to work on weight loss efforts.  He remains quite busy working a full-time job at [employer] and also playing side gig band ( [employer])    Encouraged more exercise,   Cut back on eating    .  I will see him again in  1 year       Medication Adjustments/Labs and Tests Ordered: Current medicines are reviewed at length with the patient today.  Concerns regarding medicines are outlined above.  Orders Placed This Encounter  Procedures   EKG 12-Lead    No orders of the defined types were placed in this encounter.  **8   Patient Instructions  Medication Instructions:  Your physician  recommends that you continue on your current medications as directed. Please refer to the Current Medication list given to you today.  *If you need a refill on your cardiac medications before your next appointment, please call your pharmacy*  Lab Work: If you have labs (blood work) drawn today and your tests are completely normal, you will receive your results only by: [place] (if you have MyChart) OR A paper copy in the mail If you have any lab test that is  abnormal or we need to change your treatment, we will call you to review the results.  Follow-Up: At [facility], you and your health needs are our priority.  As part of our continuing mission to provide you with exceptional heart care, we have created designated Provider Care Teams.  These Care Teams include your primary Cardiologist (physician) and Advanced Practice Providers (APPs -  Physician Assistants and Nurse Practitioners) who all work together to provide you with the care you need, when you need it.  We recommend signing up for the patient portal called "MyChart".  Sign up information is provided on this After Visit Summary.  MyChart is used to connect with patients for Virtual Visits (Telemedicine).  Patients are able to view lab/test results, encounter notes, upcoming appointments, etc.  Non-urgent messages can be sent to your provider as well.   To learn more about what you can do with MyChart, go to [email or web address].    Your next appointment:   1 year(s)  The format for your next appointment:   In Person  Provider:   Mertie Moores, MD     Signed, Mertie Moores, MD  05/21/2021 2:00 PM    [facility]  Has DM - HBA1C was 7.6   Past Medical History:  Diagnosis Date   AKI (acute kidney injury) ([place]) 07/27/2013   Cardiomyopathy ([place]) 07/25/2013   CHF (congestive heart failure) ([place])    Chicken pox    Chronic systolic heart failure ([place]) 01/04/2014   GERD (gastroesophageal reflux disease)    Headache    "maybe 5 times/yr; nothing regular" (12/06/2014)   Heart block 07/25/2013   Hyperlipemia    Hypertension    Hypokalemia 07/26/2013   Hypoxia 12/06/2014   Obesity, morbid ([place]) 12/27/2013   OSA (obstructive sleep apnea)    Sleep apnea    SOB (shortness of breath) 07/23/2013   Tachy-brady syndrome ([place]) 07/26/2013   Tachycardia 07/23/2013   Type II diabetes mellitus ([place])     Past Surgical History:  Procedure Laterality Date   ABDOMINAL  SURGERY  ~ 1987   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   APPENDECTOMY  ~ [place]  07/2013   LEFT AND RIGHT HEART CATHETERIZATION WITH CORONARY ANGIOGRAM N/A 07/26/2013   Procedure: LEFT AND RIGHT HEART CATHETERIZATION WITH CORONARY ANGIOGRAM;  Surgeon: Leonie Man, MD;  Location[facility] C[facility] LAB;  Service: Cardiovascular;  Laterality: N/A;    Current Medications: Current Meds  Medication Sig   aspirin 81 MG EC tablet Take 1 tablet (81 mg total) by mouth daily.   Blood Glucose Monitoring Suppl (TRUE METRIX METER) W/DEVICE KIT 1 each by Does not apply route once as needed.   carvedilol (COREG) 12.5 MG tablet Take 1.5 tablets (18.75 mg total) by mouth 2 (two) times daily with a meal. LAST refill without office visit please call office [phone]   ENTRESTO 97-103 MG TAKE 1 TABLET BY MOUTH 2 (TWO) TIMES DAILY.   furosemide (LASIX) 40  MG tablet TAKE 1 TABLET BY MOUTH EVERY DAY AS NEEDED   glucose blood (TRUE METRIX BLOOD GLUCOSE TEST) test strip Used 3 times daily before meals.   hydrALAZINE (APRESOLINE) 50 MG tablet TAKE 1&1/2 TABLET 3 TIMES DAILY   isosorbide mononitrate (IMDUR) 30 MG 24 hr tablet TAKE 1 TABLET BY MOUTH EVERY DAY   metFORMIN (GLUCOPHAGE) 500 MG tablet Take 1 tablet (500 mg total) by mouth 2 (two) times daily with a meal. Please keep upcoming appt. With Dr. Acie Fredrickson in Nov. In order to receive further refills.   potassium chloride (KLOR-CON) 10 MEQ tablet TAKE 1 TABLET (10 MEQ TOTAL) BY MOUTH DAILY. NEEDS APPT FOR FUTURE REFILLS   spironolactone (ALDACTONE) 25 MG tablet TAKE 1 TABLET BY MOUTH EVERY DAY   tirzepatide (MOUNJARO) 5 MG/0.5ML Pen Inject 5 mg into the skin once a week.   TRUEPLUS LANCETS 30G MISC 1 each by Does not apply route every morning.     Allergies:   Patient has no known allergies.   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       Social Determinants of Health   Financial Resource Strain: Not on file  Food Insecurity: Not on file  Transportation Needs: Not on file  Physical Activity: Not on file  Stress: Not on file  Social Connections: Not on file     Family History: The patient's family history includes Hypertension in his father and mother; Sleep apnea in his father.  ROS:   Please see the history of present illness.     All other systems reviewed and are negative.  EKGs/Labs/Other Studies Reviewed:    The following studies were reviewed today:   EKG:      Recent Labs: 03/08/2021: ALT 25; BUN 15; Creatinine, Ser 1.07; Hemoglobin 12.5; Platelets 275.0; Potassium 4.0; Sodium 140; TSH 2.74  Recent Lipid Panel    Component Value Date/Time   CHOL 157 03/08/2021 0808   TRIG 114.0 03/08/2021 0808   HDL 25.90 (L) 03/08/2021 0808   CHOLHDL 6 03/08/2021 0808   VLDL 22.8 03/08/2021 0808   LDLCALC 108 (H) 03/08/2021 0808   LDLCALC 111 (H) 02/02/2020 0836    Physical Exam:    Physical Exam: Blood pressure 128/74, pulse 83, height 6' (1.829 m), weight (!) 305 lb 3.2 oz (138.4 kg), SpO2 97 %.  GEN:  Well nourished, well developed in no acute distress HEENT: Normal NECK: No JVD; No carotid bruits LYMPHATICS: No lymphadenopathy CARDIAC: RRR , no murmurs, rubs, gallops RESPIRATORY:  Clear to auscultation without rales, wheezing or rhonchi  ABDOMEN: Soft, non-tender, non-distended MUSCULOSKELETAL:  No  edema; No deformity  SKIN: Warm and dry NEUROLOGIC:  Alert and oriented x 3     ASSESSMENT:    1. Chronic  systolic heart failure ([place])    PLAN:       1.  History of chronic systolic congestive heart failure:           Medication Adjustments/Labs and Tests Ordered: Current medicines are reviewed at length with the patient today.  Concerns regarding medicines are outlined above.  Orders Placed This Encounter  Procedures   EKG 12-Lead    No orders of the defined types were placed in this encounter.   Patient Instructions  Medication Instructions:  Your physician recommends that you continue on your current medications as directed. Please refer to the Current Medication list given to you today.  *If you need a refill on your cardiac medications before your next appointment, please call your pharmacy*  Lab Work: If you have labs (blood work) drawn today and your tests are completely normal, you will receive your results only by: [place] (if you have MyChart) OR A paper copy in the mail If you have any lab test that is abnormal or we need to change your treatment, we will call you to review the results.  Follow-Up: At [facility], you and your health needs are our priority.  As part of our continuing mission to provide you with exceptional heart care, we have created designated Provider Care Teams.  These Care Teams include your primary Cardiologist (physician) and Advanced Practice Providers (APPs -  Physician Assistants and Nurse Practitioners) who all work together to provide you with the care you need, when you need it.  We recommend signing up for the patient portal called "MyChart".  Sign up information is provided on this After Visit Summary.  MyChart is used to connect with patients for Virtual Visits (Telemedicine).  Patients are able to view lab/test results, encounter notes, upcoming appointments, etc.  Non-urgent messages can be sent to your provider as  well.   To learn more about what you can do with MyChart, go to [email or web address].    Your next appointment:   1 year(s)  The format for your next appointment:   In Person  Provider:   Mertie Moores, MD     Signed, Mertie Moores, MD  05/21/2021 2:00 PM    [place]

## 2021-05-21 ENCOUNTER — Encounter: Payer: Self-pay | Admitting: Cardiovascular Disease

## 2021-05-21 ENCOUNTER — Ambulatory Visit (INDEPENDENT_AMBULATORY_CARE_PROVIDER_SITE_OTHER): Payer: BC Managed Care – PPO | Admitting: Cardiovascular Disease

## 2021-05-21 ENCOUNTER — Other Ambulatory Visit: Payer: Self-pay

## 2021-05-21 VITALS — BP 128/74 | HR 83 | Ht 72.0 in | Wt 305.2 lb

## 2021-05-21 DIAGNOSIS — I5022 Chronic systolic (congestive) heart failure: Secondary | ICD-10-CM

## 2021-05-21 NOTE — Patient Instructions (Signed)
Medication Instructions:  Your physician recommends that you continue on your current medications as directed. Please refer to the Current Medication list given to you today.  *If you need a refill on your cardiac medications before your next appointment, please call your pharmacy*  Lab Work: If you have labs (blood work) drawn today and your tests are completely normal, you will receive your results only by: MyChart Message (if you have MyChart) OR A paper copy in the mail If you have any lab test that is abnormal or we need to change your treatment, we will call you to review the results.  Follow-Up: At [facility], you and your health needs are our priority.  As part of our continuing mission to provide you with exceptional heart care, we have created designated Provider Care Teams.  These Care Teams include your primary Cardiologist (physician) and Advanced Practice Providers (APPs -  Physician Assistants and Nurse Practitioners) who all work together to provide you with the care you need, when you need it.  We recommend signing up for the patient portal called "MyChart".  Sign up information is provided on this After Visit Summary.  MyChart is used to connect with patients for Virtual Visits (Telemedicine).  Patients are able to view lab/test results, encounter notes, upcoming appointments, etc.  Non-urgent messages can be sent to your provider as well.   To learn more about what you can do with MyChart, go to [email or web address].    Your next appointment:   1 year(s)  The format for your next appointment:   In Person  Provider:   Philip Nahser, MD      

## 2021-06-07 ENCOUNTER — Other Ambulatory Visit (HOSPITAL_COMMUNITY)
Admission: RE | Admit: 2021-06-07 | Discharge: 2021-06-07 | Disposition: A | Discharge status: Home / Self Care | Payer: BC Managed Care – PPO | Source: Ambulatory Visit | Attending: Adult Health | Admitting: Adult Health

## 2021-06-07 ENCOUNTER — Ambulatory Visit: Payer: BC Managed Care – PPO | Admitting: Adult Health

## 2021-06-07 VITALS — BP 126/86 | HR 94 | Temp 98.5°F | Ht 72.0 in | Wt 307.0 lb

## 2021-06-07 DIAGNOSIS — Z113 Encounter for screening for infections with a predominantly sexual mode of transmission: Secondary | ICD-10-CM

## 2021-06-07 DIAGNOSIS — E1169 Type 2 diabetes mellitus with other specified complication: Secondary | ICD-10-CM | POA: Diagnosis not present

## 2021-06-07 DIAGNOSIS — I10 Essential (primary) hypertension: Secondary | ICD-10-CM

## 2021-06-07 LAB — POCT GLYCOSYLATED HEMOGLOBIN (HGB A1C): Hemoglobin A1C: 5.6 % (ref 4.0–5.6)

## 2021-06-07 MED ORDER — TIRZEPATIDE 10 MG/0.5ML ~~LOC~~ SOAJ
10.0000 mg | SUBCUTANEOUS | 0 refills | Status: DC
Start: 2021-06-07 — End: 2021-08-28

## 2021-06-07 NOTE — Progress Notes (Signed)
Subjective:    Patient ID: Vernon Reed, male    DOB: Dec 27, 1975, 45 y.o.   MRN: [id number]  HPI 45 year old male who  has a past medical history of AKI (acute kidney injury) ([place]) (07/27/2013), Cardiomyopathy ([place]) (07/25/2013), CHF (congestive heart failure) ([place]), Chicken pox, Chronic systolic heart failure ([place]) (01/04/2014), GERD (gastroesophageal reflux disease), Headache, Heart block (07/25/2013), Hyperlipemia, Hypertension, Hypokalemia (07/26/2013), Hypoxia (12/06/2014), Obesity, morbid ([place]) (12/27/2013), OSA (obstructive sleep apnea), Sleep apnea, SOB (shortness of breath) (07/23/2013), Tachy-brady syndrome ([place]) (07/26/2013), Tachycardia (07/23/2013), and Type II diabetes mellitus ([place]).  DM -managed with metformin 500 mg twice daily and Mounjaro 5 mg weekly. He has not had any side effects from using Mounjaro. He is eating healthy but not exercising.   Lab Results  Component Value Date   HGBA1C 7.4 (H) 03/08/2021   Wt Readings from Last 10 Encounters:  06/07/21 (!) 307 lb (139.3 kg)  05/21/21 (!) 305 lb 3.2 oz (138.4 kg)  03/08/21 (!) 304 lb (137.9 kg)  01/08/21 (!) 317 lb (143.8 kg)  12/13/20 (!) 317 lb (143.8 kg)  09/12/20 (!) 318 lb 2 oz (144.3 kg)  02/06/20 (!) 307 lb (139.3 kg)  02/02/20 (!) 306 lb (138.8 kg)  12/21/19 (!) 306 lb (138.8 kg)  11/03/19 299 lb (135.6 kg)    HTN -currently prescribed Coreg 12.5 mg twice daily, Entresto 97-103 mg daily, Lasix 40 mg daily, hydralazine 75 mg 3 times daily, Aldactone 25 mg daily, and Imdur 30 mg daily.  He denies dizziness, lightheadedness, chest pain, or shortness of breath BP Readings from Last 3 Encounters:  06/07/21 126/86  05/21/21 128/74  03/08/21 120/90   STD Screening - would like to have STD screening done. Has no symptom or concerns just "wants to make sure"    Review of Systems See HPI   Past Medical History:  Diagnosis Date   AKI (acute kidney injury) ([place]) 07/27/2013   Cardiomyopathy ([place]) 07/25/2013   CHF  (congestive heart failure) ([place])    Chicken pox    Chronic systolic heart failure ([place]) 01/04/2014   GERD (gastroesophageal reflux disease)    Headache    "maybe 5 times/yr; nothing regular" (12/06/2014)   Heart block 07/25/2013   Hyperlipemia    Hypertension    Hypokalemia 07/26/2013   Hypoxia 12/06/2014   Obesity, morbid ([place]) 12/27/2013   OSA (obstructive sleep apnea)    Sleep apnea    SOB (shortness of breath) 07/23/2013   Tachy-brady syndrome ([place]) 07/26/2013   Tachycardia 07/23/2013   Type II diabetes mellitus ([place])     Social History   Socioeconomic History   Marital status: Divorced    Spouse name: Not on file   Number of children: 3   Years of education: Not on file   Highest education level: Bachelor's degree (e.g., BA, AB, BS)  Occupational History   Occupation: Herbalist: [employer]  Tobacco Use   Smoking status: Never   Smokeless tobacco: Never  Vaping Use   Vaping Use: Never used  Substance and Sexual Activity   Alcohol use: No    Comment: 12/06/2014 "might have a beer a couple times/month"   Drug use: No   Sexual activity: Yes  Other Topics Concern   Not on file  Social History Narrative   Librarian, 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       Social Determinants of Health   Financial Resource Strain: Medium Risk   Difficulty of  Paying Living Expenses: Somewhat hard  Food Insecurity: Food Insecurity Present   Worried About Running Out of Food in the Last Year: Sometimes true   Ran Out of Food in the Last Year: Never true  Transportation Needs: No Transportation Needs   Lack of Transportation (Medical): No   Lack of Transportation (Non-Medical): No  Physical Activity: Insufficiently Active   Days of Exercise per Week: 2 days   Minutes of Exercise per Session: 30 min  Stress: No Stress Concern Present   Feeling of Stress : Only a little  Social Connections: Moderately Integrated   Frequency of Communication with Friends and  Family: More than three times a week   Frequency of Social Gatherings with Friends and Family: Twice a week   Attends Religious Services: More than 4 times per year   Active Member of [employer] or Organizations: Yes   Attends Music therapist: More than 4 times per year   Marital Status: Divorced  Human resources officer Violence: Not on file    Past Surgical History:  Procedure Laterality Date   ABDOMINAL SURGERY  ~ 1987   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   APPENDECTOMY  ~ [place]  07/2013   LEFT AND RIGHT HEART CATHETERIZATION WITH CORONARY ANGIOGRAM N/A 07/26/2013   Procedure: LEFT AND RIGHT HEART CATHETERIZATION WITH CORONARY ANGIOGRAM;  Surgeon: Leonie Man, MD;  Location: [facility] CATH LAB;  Service: Cardiovascular;  Laterality: N/A;    Family History  Problem Relation Age of Onset   Hypertension Mother    Hypertension Father    Sleep apnea Father     No Known Allergies  Current Outpatient Medications on File Prior to Visit  Medication Sig Dispense Refill   aspirin 81 MG EC tablet Take 1 tablet (81 mg total) by mouth daily. 30 tablet 6   Blood Glucose Monitoring Suppl (TRUE METRIX METER) W/DEVICE KIT 1 each by Does not apply route once as needed. 1 kit 0   carvedilol (COREG) 12.5 MG tablet Take 1.5 tablets (18.75 mg total) by mouth 2 (two) times daily with a meal. LAST refill without office visit please call office [phone] 90 tablet 3   ENTRESTO 97-103 MG TAKE 1 TABLET BY MOUTH 2 (TWO) TIMES DAILY. 180 tablet 6   furosemide (LASIX) 40 MG tablet TAKE 1 TABLET BY MOUTH EVERY DAY AS NEEDED 90 tablet 0   glucose blood (TRUE METRIX BLOOD GLUCOSE TEST) test strip Used 3 times daily before meals. 100 each 12   hydrALAZINE (APRESOLINE) 50 MG tablet TAKE 1&1/2 TABLET 3 TIMES DAILY 135 tablet 5   isosorbide mononitrate (IMDUR) 30 MG 24 hr tablet TAKE 1 TABLET BY MOUTH EVERY DAY 90 tablet 2   metFORMIN (GLUCOPHAGE) 500 MG tablet Take 1 tablet (500 mg  total) by mouth 2 (two) times daily with a meal. Please keep upcoming appt. With Dr. Acie Fredrickson in Nov. In order to receive further refills. 180 tablet 0   potassium chloride (KLOR-CON) 10 MEQ tablet TAKE 1 TABLET (10 MEQ TOTAL) BY MOUTH DAILY. NEEDS APPT FOR FUTURE REFILLS 90 tablet 0   spironolactone (ALDACTONE) 25 MG tablet TAKE 1 TABLET BY MOUTH EVERY DAY 90 tablet 2   tirzepatide (MOUNJARO) 5 MG/0.5ML Pen Inject 5 mg into the skin once a week. 6 mL 0   TRUEPLUS LANCETS 30G MISC 1 each by Does not apply route every morning. 50 each 11   No current facility-administered medications on file prior to visit.  BP 126/86 (BP Location: Left Arm, Patient Position: Sitting, Cuff Size: Large)   Pulse 94   Temp 98.5 F (36.9 C) (Oral)   Ht 6' (1.829 m)   Wt (!) 307 lb (139.3 kg)   SpO2 96%   BMI 41.64 kg/m       Objective:   Physical Exam Vitals and nursing note reviewed.  Constitutional:      Appearance: Normal appearance. He is obese.  Cardiovascular:     Rate and Rhythm: Normal rate and regular rhythm.     Pulses: Normal pulses.     Heart sounds: Normal heart sounds.  Pulmonary:     Effort: Pulmonary effort is normal.     Breath sounds: Normal breath sounds.  Musculoskeletal:        General: Normal range of motion.  Skin:    General: Skin is warm and dry.     Capillary Refill: Capillary refill takes less than 2 seconds.  Neurological:     General: No focal deficit present.     Mental Status: He is alert and oriented to person, place, and time.  Psychiatric:        Mood and Affect: Mood normal.        Behavior: Behavior normal.        Thought Content: Thought content normal.        Judgment: Judgment normal.      Assessment & Plan:  1. Type 2 diabetes mellitus with other specified complication, unspecified whether long term insulin use (HCC)  - POCT HgB A1C- 5.6 - has improved and at goal.  - Encouraged to continue to work on heart healthy diet and need to start  exercising.  - tirzepatide (MOUNJARO) 10 MG/0.5ML Pen; Inject 10 mg into the skin once a week.  Dispense: 6 mL; Refill: 0  2. Essential hypertension - Controlled.  - No change in medications   3. Screening for STD (sexually transmitted disease)  - HIV Antibody (routine testing w rflx); Future - Urine cytology ancillary only; Future  Dorothyann Peng, NP

## 2021-06-07 NOTE — Addendum Note (Signed)
Addended by: Marian Sorrow D on: 06/07/2021 08:32 AM   Modules accepted: Orders

## 2021-06-10 ENCOUNTER — Encounter: Payer: Self-pay | Admitting: Adult Health

## 2021-06-10 LAB — URINE CYTOLOGY ANCILLARY ONLY
Chlamydia: NEGATIVE
Comment: NEGATIVE
Comment: NORMAL
Neisseria Gonorrhea: NEGATIVE

## 2021-06-10 LAB — HIV ANTIBODY (ROUTINE TESTING W REFLEX): HIV 1&2 Ab, 4th Generation: NONREACTIVE

## 2021-06-12 NOTE — Telephone Encounter (Signed)
Please advise 

## 2021-06-28 NOTE — Telephone Encounter (Signed)
Encounter completed.

## 2021-07-30 ENCOUNTER — Other Ambulatory Visit: Payer: Self-pay | Admitting: Cardiology

## 2021-07-30 ENCOUNTER — Other Ambulatory Visit (HOSPITAL_COMMUNITY): Payer: Self-pay | Admitting: Cardiovascular Disease

## 2021-07-30 DIAGNOSIS — I509 Heart failure, unspecified: Secondary | ICD-10-CM

## 2021-08-01 ENCOUNTER — Other Ambulatory Visit: Payer: Self-pay | Admitting: Cardiovascular Disease

## 2021-08-02 ENCOUNTER — Telehealth (HOSPITAL_COMMUNITY): Payer: Self-pay | Admitting: Pharmacy Technician

## 2021-08-02 NOTE — Telephone Encounter (Signed)
Patient Advocate Encounter   Received notification from [employer] that prior authorization for Vernon Reed is required.   PA submitted on CoverMyMeds Key [id number] Status is pending   Will continue to follow.

## 2021-08-04 ENCOUNTER — Other Ambulatory Visit: Payer: Self-pay | Admitting: Cardiology

## 2021-08-04 ENCOUNTER — Other Ambulatory Visit: Payer: Self-pay | Admitting: Cardiovascular Disease

## 2021-08-04 DIAGNOSIS — E1165 Type 2 diabetes mellitus with hyperglycemia: Secondary | ICD-10-CM

## 2021-08-06 NOTE — Telephone Encounter (Signed)
I received a request to refill his metformin.   I will defer the management of his diabetes to Shirline Frees, NP

## 2021-08-08 NOTE — Telephone Encounter (Signed)
Received notification from [facility] regarding a prior authorization for  Entresto . Authorization has been APPROVED from 08/02/21 to 08/02/22.   Document scanned into chart.  Authorization # [id number] Phone # [phone]

## 2021-08-21 ENCOUNTER — Other Ambulatory Visit: Payer: Self-pay | Admitting: Cardiology

## 2021-08-21 DIAGNOSIS — I509 Heart failure, unspecified: Secondary | ICD-10-CM

## 2021-08-27 ENCOUNTER — Telehealth: Payer: Self-pay | Admitting: Adult Health

## 2021-08-27 NOTE — Telephone Encounter (Signed)
Patient called in requesting a refill for tirzepatide [facility]) 10 MG/0.5ML Pen [[id number]]  to be sent in to his pharmacy.  Please advise.

## 2021-08-28 ENCOUNTER — Other Ambulatory Visit: Payer: Self-pay

## 2021-08-28 DIAGNOSIS — E1169 Type 2 diabetes mellitus with other specified complication: Secondary | ICD-10-CM

## 2021-08-28 MED ORDER — TIRZEPATIDE 10 MG/0.5ML ~~LOC~~ SOAJ: 10.0000 mg | SUBCUTANEOUS | 0 refills | Status: DC

## 2021-08-28 NOTE — Telephone Encounter (Signed)
Rx refilled.

## 2021-09-05 ENCOUNTER — Other Ambulatory Visit (HOSPITAL_COMMUNITY)
Admission: RE | Admit: 2021-09-05 | Discharge: 2021-09-05 | Disposition: A | Discharge status: Home / Self Care | Payer: BC Managed Care – PPO | Source: Ambulatory Visit | Attending: Adult Health | Admitting: Adult Health

## 2021-09-05 ENCOUNTER — Encounter: Payer: Self-pay | Admitting: Adult Health

## 2021-09-05 ENCOUNTER — Ambulatory Visit (INDEPENDENT_AMBULATORY_CARE_PROVIDER_SITE_OTHER): Payer: BC Managed Care – PPO | Admitting: Adult Health

## 2021-09-05 VITALS — BP 102/78 | HR 98 | Temp 98.8°F | Ht 72.0 in | Wt 296.0 lb

## 2021-09-05 DIAGNOSIS — I10 Essential (primary) hypertension: Secondary | ICD-10-CM | POA: Diagnosis not present

## 2021-09-05 DIAGNOSIS — E1169 Type 2 diabetes mellitus with other specified complication: Secondary | ICD-10-CM | POA: Diagnosis not present

## 2021-09-05 DIAGNOSIS — Z113 Encounter for screening for infections with a predominantly sexual mode of transmission: Secondary | ICD-10-CM | POA: Diagnosis present

## 2021-09-05 LAB — POCT GLYCOSYLATED HEMOGLOBIN (HGB A1C): Hemoglobin A1C: 5.5 % (ref 4.0–5.6)

## 2021-09-05 MED ORDER — POTASSIUM CHLORIDE ER 10 MEQ PO TBCR: 10.0000 meq | EXTENDED_RELEASE_TABLET | Freq: Every day | ORAL | 1 refills | Status: DC

## 2021-09-05 MED ORDER — TIRZEPATIDE 10 MG/0.5ML ~~LOC~~ SOAJ: 10.0000 mg | SUBCUTANEOUS | 0 refills | Status: DC

## 2021-09-05 NOTE — Addendum Note (Signed)
Addended by: Mady Gemma on: 09/05/2021 08:27 AM ? ? Modules accepted: Orders ? ?

## 2021-09-05 NOTE — Progress Notes (Signed)
? ?Subjective:  ? ? Patient ID: Vernon Reed, male    DOB: 1975-11-06, 46 y.o.   MRN: [id number] ? ?HPI ?46 year old male who  has a past medical history of AKI (acute kidney injury) ([place]) (07/27/2013), Cardiomyopathy ([place]) (07/25/2013), CHF (congestive heart failure) ([place]), Chicken pox, Chronic systolic heart failure ([place]) (01/04/2014), GERD (gastroesophageal reflux disease), Headache, Heart block (07/25/2013), Hyperlipemia, Hypertension, Hypokalemia (07/26/2013), Hypoxia (12/06/2014), Obesity, morbid ([place]) (12/27/2013), OSA (obstructive sleep apnea), Sleep apnea, SOB (shortness of breath) (07/23/2013), Tachy-brady syndrome ([place]) (07/26/2013), Tachycardia (07/23/2013), and Type II diabetes mellitus ([place]). ? ?He presents to the office today for 3 month follow up regarding HTN and DM type 2 ? ?Type 2 DM -currently maintained on Mounjaro 10 mg weekly.  He denies any side effects from using Mounjaro. He still is not exercising but is eating healthy. Reports that mounjaro curbs his appetite  ?Lab Results  ?Component Value Date  ? HGBA1C 5.6 06/07/2021  ? ?Wt Readings from Last 3 Encounters:  ?09/05/21 296 lb (134.3 kg)  ?06/07/21 (!) 307 lb (139.3 kg)  ?05/21/21 (!) 305 lb 3.2 oz (138.4 kg)  ? ?HTN -currently prescribed Coreg 12.5 mg twice daily, Entresto 97-103 mg daily, Lasix 40 mg daily, hydralazine 75 mg 3 times daily, Aldactone 25 mg daily, and Imdur 30 mg daily.  He denies dizziness, lightheadedness, chest pain, shortness of breath ?BP Readings from Last 3 Encounters:  ?09/05/21 102/78  ?06/07/21 126/86  ?05/21/21 128/74  ? ?STD screening - is in a new relationship. Would like STD testing. Denies symptoms  ? ?Review of Systems ?See HPI  ? ?Past Medical History:  ?Diagnosis Date  ? AKI (acute kidney injury) ([place]) 07/27/2013  ? Cardiomyopathy ([place]) 07/25/2013  ? CHF (congestive heart failure) ([place])   ? Chicken pox   ? Chronic systolic heart failure ([place]) 01/04/2014  ? GERD (gastroesophageal reflux disease)   ? Headache   ? "maybe  5 times/yr; nothing regular" (12/06/2014)  ? Heart block 07/25/2013  ? Hyperlipemia   ? Hypertension   ? Hypokalemia 07/26/2013  ? Hypoxia 12/06/2014  ? Obesity, morbid ([place]) 12/27/2013  ? OSA (obstructive sleep apnea)   ? Sleep apnea   ? SOB (shortness of breath) 07/23/2013  ? Tachy-brady syndrome ([place]) 07/26/2013  ? Tachycardia 07/23/2013  ? Type II diabetes mellitus ([place])   ? ? ?Social History  ? ?Socioeconomic History  ? Marital status: Divorced  ?  Spouse name: Not on file  ? Number of children: 3  ? Years of education: Not on file  ? Highest education level: Bachelor's degree (e.g., BA, AB, BS)  ?Occupational History  ? Occupation: Government social research officer  ?  Employer: [employer]  ?Tobacco Use  ? Smoking status: Never  ? Smokeless tobacco: Never  ?Vaping Use  ? Vaping Use: Never used  ?Substance and Sexual Activity  ? Alcohol use: No  ?  Comment: 12/06/2014 "might have a beer a couple times/month"  ? Drug use: No  ? Sexual activity: Yes  ?Other Topics Concern  ? Not on file  ?Social History Narrative  ? 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   ?   ? ?Social Determinants of Health  ? ?Financial Resource Strain: Medium Risk  ? Difficulty of Paying Living Expenses: Somewhat hard  ?Food Insecurity: Food Insecurity Present  ? Worried About Charity fundraiser in the Last Year: Sometimes true  ? Ran Out of Food in the Last Year: Never true  ?Transportation Needs:  No Transportation Needs  ? Lack of Transportation (Medical): No  ? Lack of Transportation (Non-Medical): No  ?Physical Activity: Insufficiently Active  ? Days of Exercise per Week: 2 days  ? Minutes of Exercise per Session: 30 min  ?Stress: No Stress Concern Present  ? Feeling of Stress : Only a little  ?Social Connections: Moderately Integrated  ? Frequency of Communication with Friends and Family: More than three times a week  ? Frequency of Social Gatherings with Friends and Family: Twice a week  ? Attends Religious Services: More than 4 times per year  ?  Active Member of Clubs or Organizations: Yes  ? Attends Archivist Meetings: More than 4 times per year  ? Marital Status: Divorced  ?Intimate Partner Violence: Not on file  ? ? ?Past Surgical History:  ?Procedure Laterality Date  ? ABDOMINAL SURGERY  ~ 1987  ? 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  ? APPENDECTOMY  ~ 1989  ? CARDIAC CATHETERIZATION  07/2013  ? LEFT AND RIGHT HEART CATHETERIZATION WITH CORONARY ANGIOGRAM N/A 07/26/2013  ? Procedure: LEFT AND RIGHT HEART CATHETERIZATION WITH CORONARY ANGIOGRAM;  Surgeon: Leonie Man, MD;  Location: [facility] CATH LAB;  Service: Cardiovascular;  Laterality: N/A;  ? ? ?Family History  ?Problem Relation Age of Onset  ? Hypertension Mother   ? Hypertension Father   ? Sleep apnea Father   ? ? ?No Known Allergies ? ?Current Outpatient Medications on File Prior to Visit  ?Medication Sig Dispense Refill  ? aspirin 81 MG EC tablet Take 1 tablet (81 mg total) by mouth daily. 30 tablet 6  ? Blood Glucose Monitoring Suppl (TRUE METRIX METER) W/DEVICE KIT 1 each by Does not apply route once as needed. 1 kit 0  ? carvedilol (COREG) 12.5 MG tablet TAKE 1.5 TABLETS BY MOUTH 2 (TWO) TIMES DAILY WITH A MEAL. LAST REFILL WITHOUT OFFICE VISIT 270 tablet 3  ? ENTRESTO 97-103 MG TAKE 1 TABLET BY MOUTH 2 (TWO) TIMES DAILY. 180 tablet 6  ? furosemide (LASIX) 40 MG tablet TAKE 1 TABLET BY MOUTH EVERY DAY AS NEEDED 90 tablet 3  ? glucose blood (TRUE METRIX BLOOD GLUCOSE TEST) test strip Used 3 times daily before meals. 100 each 12  ? hydrALAZINE (APRESOLINE) 50 MG tablet Take 1 tablet (50 mg total) by mouth 3 (three) times daily. NEEDS APPOINTMENT FOR ANY MORE REFILLS 60 tablet 0  ? isosorbide mononitrate (IMDUR) 30 MG 24 hr tablet TAKE 1 TABLET BY MOUTH EVERY DAY 90 tablet 2  ? potassium chloride (KLOR-CON) 10 MEQ tablet Take 1 tablet (10 mEq total) by mouth daily. Needs appt for future refills. LAST REFILL 15 tablet 0  ? spironolactone (ALDACTONE) 25 MG tablet TAKE 1 TABLET BY MOUTH  EVERY DAY 90 tablet 2  ? TRUEPLUS LANCETS 30G MISC 1 each by Does not apply route every morning. 50 each 11  ? ?No current facility-administered medications on file prior to visit.  ? ? ?BP 102/78   Pulse 98   Temp 98.8 ?F (37.1 ?C) (Oral)   Ht 6' (1.829 m)   Wt 296 lb (134.3 kg)   SpO2 94%   BMI 40.14 kg/m?  ? ? ?   ?Objective:  ? Physical Exam ?Vitals and nursing note reviewed.  ?Constitutional:   ?   Appearance: Normal appearance.  ?Cardiovascular:  ?   Rate and Rhythm: Normal rate and regular rhythm.  ?   Pulses: Normal pulses.  ?   Heart sounds: Normal heart  sounds.  ?Pulmonary:  ?   Effort: Pulmonary effort is normal.  ?   Breath sounds: Normal breath sounds.  ?Skin: ?   General: Skin is warm and dry.  ?Neurological:  ?   General: No focal deficit present.  ?   Mental Status: He is alert and oriented to person, place, and time.  ?Psychiatric:     ?   Mood and Affect: Mood normal.     ?   Behavior: Behavior normal.     ?   Thought Content: Thought content normal.     ?   Judgment: Judgment normal.  ? ?   ?Assessment & Plan:  ?1. Type 2 diabetes mellitus with other specified complication, unspecified whether long term insulin use ([place]) ? ?- tirzepatide (MOUNJARO) 10 MG/0.5ML Pen; Inject 10 mg into the skin once a week.  Dispense: 6 mL; Refill: 0 ?- POC HgB A1c- 5.5  ?- Weight is down 11 pounds.  ?- Encouraged to start working out  ?- Follow up in three months  ? ?2. Essential hypertension ?- Controlled. Needs refill of potassium  ?- potassium chloride (KLOR-CON) 10 MEQ tablet; Take 1 tablet (10 mEq total) by mouth daily.  Dispense: 90 tablet; Refill: 1 ? ?3. Screening for STD (sexually transmitted disease) ? ?- HIV Antibody (routine testing w rflx); Future ?- Urine cytology ancillary only; Future ? ?Dorothyann Peng, NP ? ?

## 2021-09-06 LAB — URINE CYTOLOGY ANCILLARY ONLY
Chlamydia: NEGATIVE
Comment: NEGATIVE
Comment: NEGATIVE
Comment: NORMAL
Neisseria Gonorrhea: NEGATIVE
Trichomonas: NEGATIVE

## 2021-09-06 LAB — HIV ANTIBODY (ROUTINE TESTING W REFLEX): HIV 1&2 Ab, 4th Generation: NONREACTIVE

## 2021-09-25 ENCOUNTER — Other Ambulatory Visit: Payer: Self-pay | Admitting: Cardiology

## 2021-09-25 NOTE — Telephone Encounter (Signed)
Now followed by [place] ?

## 2021-11-04 ENCOUNTER — Other Ambulatory Visit: Payer: Self-pay | Admitting: Adult Health

## 2021-11-04 DIAGNOSIS — E1169 Type 2 diabetes mellitus with other specified complication: Secondary | ICD-10-CM

## 2021-11-05 MED ORDER — TIRZEPATIDE 10 MG/0.5ML ~~LOC~~ SOAJ: 10.0000 mg | SUBCUTANEOUS | 0 refills | Status: DC

## 2021-11-19 ENCOUNTER — Encounter: Payer: Self-pay | Admitting: Adult Health

## 2021-11-20 NOTE — Telephone Encounter (Signed)
Called pt insurance and got the [place] approved. Pt notified of update.  ?

## 2021-11-20 NOTE — Telephone Encounter (Signed)
Vernon Reed (KeyNada Boozer) ?Mounjaro 10MG /0.5ML pen-injectors ? ? ?APPROVED ?  ?Form ?[employer] Electronic PA Form [id number] NCPDP) ?Created ?5 minutes ago ?Sent to Plan ?less than a minute ago ?Plan Response ?less than a minute ago ?Submit Clinical Questions ?Determination ?Message from Plan ?Your PA has been resolved, no additional PA is required. For further inquiries please contact the number on the back of the member prescription card. (Message [phone]) ?

## 2021-11-20 NOTE — Telephone Encounter (Signed)
Pt called stating he went back to the pharmacy around 3pm and they stated the prescription was still going to cost him $848.  ?

## 2021-11-22 NOTE — Telephone Encounter (Signed)
Called pt insurance and the representative did state that the medication was covered. However, pt would still have a high Co-Pay. Per representative she advised that I talk to [facility] stated [facility] will [facility] PPW to sign to hopefully help pt have a lower pay for the medication. Advised pt and PCP of update.

## 2021-12-06 ENCOUNTER — Other Ambulatory Visit (HOSPITAL_COMMUNITY)
Admission: RE | Admit: 2021-12-06 | Discharge: 2021-12-06 | Disposition: A | Discharge status: Home / Self Care | Payer: BC Managed Care – PPO | Source: Ambulatory Visit | Attending: Adult Health | Admitting: Adult Health

## 2021-12-06 ENCOUNTER — Ambulatory Visit: Payer: BC Managed Care – PPO | Admitting: Adult Health

## 2021-12-06 ENCOUNTER — Encounter: Payer: Self-pay | Admitting: Adult Health

## 2021-12-06 VITALS — BP 120/86 | HR 83 | Temp 98.5°F | Ht 72.0 in | Wt 304.0 lb

## 2021-12-06 DIAGNOSIS — Z113 Encounter for screening for infections with a predominantly sexual mode of transmission: Secondary | ICD-10-CM | POA: Insufficient documentation

## 2021-12-06 DIAGNOSIS — I10 Essential (primary) hypertension: Secondary | ICD-10-CM

## 2021-12-06 DIAGNOSIS — E1169 Type 2 diabetes mellitus with other specified complication: Secondary | ICD-10-CM | POA: Diagnosis not present

## 2021-12-06 LAB — POCT GLYCOSYLATED HEMOGLOBIN (HGB A1C): HbA1c, POC (prediabetic range): 5.6 % — AB (ref 5.7–6.4)

## 2021-12-06 MED ORDER — OZEMPIC (1 MG/DOSE) 4 MG/3ML ~~LOC~~ SOPN: 1.0000 mg | PEN_INJECTOR | SUBCUTANEOUS | 2 refills | Status: DC

## 2021-12-06 NOTE — Progress Notes (Signed)
Subjective:    Patient ID: Vernon Reed, male    DOB: Sep 30, 1975, 45 y.o.   MRN: [id number]  HPI 46 year old male who  has a past medical history of AKI (acute kidney injury) ([place]) (07/27/2013), Cardiomyopathy ([place]) (07/25/2013), CHF (congestive heart failure) ([place]), Chicken pox, Chronic systolic heart failure ([place]) (01/04/2014), GERD (gastroesophageal reflux disease), Headache, Heart block (07/25/2013), Hyperlipemia, Hypertension, Hypokalemia (07/26/2013), Hypoxia (12/06/2014), Obesity, morbid ([place]) (12/27/2013), OSA (obstructive sleep apnea), Sleep apnea, SOB (shortness of breath) (07/23/2013), Tachy-brady syndrome ([place]) (07/26/2013), Tachycardia (07/23/2013), and Type II diabetes mellitus ([place]).  He presents to the office today for 65-monthfollow-up regarding diabetes mellitus type 2 and hypertension.  Diabetes mellitus type II-is currently maintained on Mounjaro 10 mg weekly.  He has not had any side effects with using this medication.  He is eating healthy but is not exercising. He reports that he can no longer afford Mounjaro after his savings card expired. He has not had the medication in about 2 weeks   Lab Results  Component Value Date   HGBA1C 5.5 09/05/2021   Wt Readings from Last 10 Encounters:  12/06/21 (!) 304 lb (137.9 kg)  09/05/21 296 lb (134.3 kg)  06/07/21 (!) 307 lb (139.3 kg)  05/21/21 (!) 305 lb 3.2 oz (138.4 kg)  03/08/21 (!) 304 lb (137.9 kg)  01/08/21 (!) 317 lb (143.8 kg)  12/13/20 (!) 317 lb (143.8 kg)  09/12/20 (!) 318 lb 2 oz (144.3 kg)  02/06/20 (!) 307 lb (139.3 kg)  02/02/20 (!) 306 lb (138.8 kg)    Hypertension-currently managed with Coreg 12.5 mg twice daily, Entresto 97-103 mg daily, Lasix 40 mg daily, hydralazine 75 mg 3 times daily, lactone 25 mg daily, and Imdur 30 mg daily.  He denies dizziness, lightheadedness, chest pain, or shortness of breath  BP Readings from Last 3 Encounters:  12/06/21 120/86  09/05/21 102/78  06/07/21 126/86    STD screening  - is in a relationship but would like to have STD testing done. Does not want HIV or RPR. Denies symptoms   Review of Systems See HPI   Past Medical History:  Diagnosis Date   AKI (acute kidney injury) ([place] 07/27/2013   Cardiomyopathy ([place] 07/25/2013   CHF (congestive heart failure) ([place]    Chicken pox    Chronic systolic heart failure ([place] 01/04/2014   GERD (gastroesophageal reflux disease)    Headache    "maybe 5 times/yr; nothing regular" (12/06/2014)   Heart block 07/25/2013   Hyperlipemia    Hypertension    Hypokalemia 07/26/2013   Hypoxia 12/06/2014   Obesity, morbid ([place] 12/27/2013   OSA (obstructive sleep apnea)    Sleep apnea    SOB (shortness of breath) 07/23/2013   Tachy-brady syndrome ([place] 07/26/2013   Tachycardia 07/23/2013   Type II diabetes mellitus ([place]     Social History   Socioeconomic History   Marital status: Divorced    Spouse name: Not on file   Number of children: 3   Years of education: Not on file   Highest education level: Bachelor's degree (e.g., BA, AB, BS)  Occupational History   Occupation: pHerbalist [employer]  Tobacco Use   Smoking status: Never   Smokeless tobacco: Never  Vaping Use   Vaping Use: Never used  Substance and Sexual Activity   Alcohol use: No    Comment: 12/06/2014 "might have a beer a couple times/month"   Drug use: No   Sexual activity:  Yes  Other Topics Concern   Not on file  Social History Narrative   Librarian, 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       Social Determinants of Health   Financial Resource Strain: Medium Risk   Difficulty of Paying Living Expenses: Somewhat hard  Food Insecurity: Food Insecurity Present   Worried About Running Out of Food in the Last Year: Sometimes true   Ran Out of Food in the Last Year: Never true  Transportation Needs: No Transportation Needs   Lack of Transportation (Medical): No   Lack of Transportation (Non-Medical): No  Physical Activity:  Insufficiently Active   Days of Exercise per Week: 2 days   Minutes of Exercise per Session: 30 min  Stress: No Stress Concern Present   Feeling of Stress : Only a little  Social Connections: Moderately Integrated   Frequency of Communication with Friends and Family: More than three times a week   Frequency of Social Gatherings with Friends and Family: Twice a week   Attends Religious Services: More than 4 times per year   Active Member of [employer] or Organizations: Yes   Attends Music therapist: More than 4 times per year   Marital Status: Divorced  Human resources officer Violence: Not on file    Past Surgical History:  Procedure Laterality Date   ABDOMINAL SURGERY  ~ 1987   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   APPENDECTOMY  ~ [place]  07/2013   LEFT AND RIGHT HEART CATHETERIZATION WITH CORONARY ANGIOGRAM N/A 07/26/2013   Procedure: LEFT AND RIGHT HEART CATHETERIZATION WITH CORONARY ANGIOGRAM;  Surgeon: Leonie Man, MD;  Location: [facility] CATH LAB;  Service: Cardiovascular;  Laterality: N/A;    Family History  Problem Relation Age of Onset   Hypertension Mother    Hypertension Father    Sleep apnea Father     No Known Allergies  Current Outpatient Medications on File Prior to Visit  Medication Sig Dispense Refill   aspirin 81 MG EC tablet Take 1 tablet (81 mg total) by mouth daily. 30 tablet 6   Blood Glucose Monitoring Suppl (TRUE METRIX METER) W/DEVICE KIT 1 each by Does not apply route once as needed. 1 kit 0   carvedilol (COREG) 12.5 MG tablet TAKE 1.5 TABLETS BY MOUTH 2 (TWO) TIMES DAILY WITH A MEAL. LAST REFILL WITHOUT OFFICE VISIT 270 tablet 3   ENTRESTO 97-103 MG TAKE 1 TABLET BY MOUTH 2 (TWO) TIMES DAILY. 180 tablet 6   furosemide (LASIX) 40 MG tablet TAKE 1 TABLET BY MOUTH EVERY DAY AS NEEDED 90 tablet 3   glucose blood (TRUE METRIX BLOOD GLUCOSE TEST) test strip Used 3 times daily before meals. 100 each 12   hydrALAZINE (APRESOLINE) 50  MG tablet Take 1 tablet (50 mg total) by mouth 3 (three) times daily. 90 tablet 7   isosorbide mononitrate (IMDUR) 30 MG 24 hr tablet TAKE 1 TABLET BY MOUTH EVERY DAY 90 tablet 2   potassium chloride (KLOR-CON) 10 MEQ tablet Take 1 tablet (10 mEq total) by mouth daily. 90 tablet 1   spironolactone (ALDACTONE) 25 MG tablet TAKE 1 TABLET BY MOUTH EVERY DAY 90 tablet 2   tirzepatide (MOUNJARO) 10 MG/0.5ML Pen Inject 10 mg into the skin once a week. 6 mL 0   TRUEPLUS LANCETS 30G MISC 1 each by Does not apply route every morning. 50 each 11   No current facility-administered medications on file prior to visit.  BP 120/86   Pulse 83   Temp 98.5 F (36.9 C) (Oral)   Ht 6' (1.829 m)   Wt (!) 304 lb (137.9 kg)   SpO2 94%   BMI 41.23 kg/m       Objective:   Physical Exam Vitals and nursing note reviewed.  Constitutional:      Appearance: Normal appearance.  Cardiovascular:     Rate and Rhythm: Normal rate and regular rhythm.     Pulses: Normal pulses.     Heart sounds: Normal heart sounds.  Pulmonary:     Effort: Pulmonary effort is normal.     Breath sounds: Normal breath sounds.  Musculoskeletal:        General: Normal range of motion.  Skin:    General: Skin is warm and dry.  Neurological:     General: No focal deficit present.     Mental Status: He is alert and oriented to person, place, and time.  Psychiatric:        Mood and Affect: Mood normal.        Behavior: Behavior normal.        Thought Content: Thought content normal.      Assessment & Plan:  1. Type 2 diabetes mellitus with other specified complication, unspecified whether long term insulin use (HCC)  - POC HgB A1c- 5.6 - Will switch to Ozempic  - Follow up in 3 months or sooner if needed - Semaglutide, 1 MG/DOSE, (OZEMPIC, 1 MG/DOSE,) 4 MG/3ML SOPN; Inject 1 mg into the skin once a week.  Dispense: 3 mL; Refill: 2  2. Essential hypertension - well controlled. No change in medications   3. Screening  for STD (sexually transmitted disease)  - Urine cytology ancillary only; Future  Dorothyann Peng, NP

## 2021-12-06 NOTE — Patient Instructions (Signed)
Health Maintenance Due  Topic Date Due   COVID-19 Vaccine (3 - Booster for Moderna series) 12/28/2020   OPHTHALMOLOGY EXAM  01/16/2021      Row Labels 09/05/2021    8:09 AM 06/07/2021    7:57 AM 03/08/2021    7:34 AM  Depression screen PHQ 2/9   Section Header. No data exists in this row.     Decreased Interest   0 0 0  Down, Depressed, Hopeless   0 0 0  PHQ - 2 Score   0 0 0

## 2021-12-09 LAB — URINE CYTOLOGY ANCILLARY ONLY
Chlamydia: NEGATIVE
Comment: NEGATIVE
Comment: NEGATIVE
Comment: NORMAL
Neisseria Gonorrhea: NEGATIVE
Trichomonas: NEGATIVE

## 2021-12-24 ENCOUNTER — Other Ambulatory Visit: Payer: Self-pay | Admitting: Adult Health

## 2021-12-24 DIAGNOSIS — I10 Essential (primary) hypertension: Secondary | ICD-10-CM

## 2022-01-02 ENCOUNTER — Telehealth: Payer: Self-pay | Admitting: Cardiovascular Disease

## 2022-01-02 NOTE — Telephone Encounter (Signed)
Pt c/o Shortness Of Breath: STAT if SOB developed within the last 24 hours or pt is noticeably SOB on the phone  1. Are you currently SOB (can you hear that pt is SOB on the phone)? no  2. How long have you been experiencing SOB? Not sure  3. Are you SOB when sitting or when up moving around? Moving around  4. Are you currently experiencing any other symptoms? Pt states that he has been feeling very fatigue and a little SOB. He also states his chest feel tight. He wanted to see if Dr. Elease Hashimoto is available to see him today, and I did not see any availability. Pt is still scheduled for tomorrow 6/30 with Dr. Elease Hashimoto. Please advise.

## 2022-01-02 NOTE — Telephone Encounter (Signed)
Reached out to patient who condones "real mild, not all the time, pain on my L side of my chest." Pt states the pain is intermittent. He denies any swelling of extremities or abdomen, no nausea/vomiting, or diaphoresis. Does attest to an increased, mild cough "over the last few days." Pt states that the SOB is mild and is not limiting any daily activities. Condones taking all his meds and states he has been taking Furosemide daily for the "last few days, just to see, and I ain't really been peeing that much, so I don't guess I have the fluids." He's concerned because he doesn't want to have another HF exacerbation. Advised pt that we will do an EKG and listen to his heart and lungs tomorrow.

## 2022-01-03 ENCOUNTER — Ambulatory Visit: Payer: BC Managed Care – PPO | Admitting: Cardiovascular Disease

## 2022-01-03 ENCOUNTER — Encounter: Payer: Self-pay | Admitting: Cardiovascular Disease

## 2022-01-03 VITALS — BP 126/70 | HR 98 | Ht 72.0 in | Wt 305.8 lb

## 2022-01-03 DIAGNOSIS — Z01810 Encounter for preprocedural cardiovascular examination: Secondary | ICD-10-CM | POA: Diagnosis not present

## 2022-01-03 DIAGNOSIS — I10 Essential (primary) hypertension: Secondary | ICD-10-CM

## 2022-01-03 DIAGNOSIS — R071 Chest pain on breathing: Secondary | ICD-10-CM | POA: Diagnosis not present

## 2022-01-03 DIAGNOSIS — I5032 Chronic diastolic (congestive) heart failure: Secondary | ICD-10-CM | POA: Diagnosis not present

## 2022-01-03 MED ORDER — METOPROLOL TARTRATE 100 MG PO TABS: ORAL_TABLET | ORAL | 0 refills | Status: DC

## 2022-01-03 NOTE — Patient Instructions (Signed)
Medication Instructions:  Your physician recommends that you continue on your current medications as directed. Please refer to the Current Medication list given to you today.  *If you need a refill on your cardiac medications before your next appointment, please call your pharmacy*   Lab Work: CBC, BMET Today If you have labs (blood work) drawn today and your tests are completely normal, you will receive your results only by: MyChart Message (if you have MyChart) OR A paper copy in the mail If you have any lab test that is abnormal or we need to change your treatment, we will call you to review the results.   Testing/Procedures: ECHO Your physician has requested that you have an echocardiogram. Echocardiography is a painless test that uses sound waves to create images of your heart. It provides your doctor with information about the size and shape of your heart and how well your heart's chambers and valves are working. This procedure takes approximately one hour. There are no restrictions for this procedure.  Coronary CT Angiogram Your physician has requested that you have cardiac CT. Cardiac computed tomography (CT) is a painless test that uses an x-ray machine to take clear, detailed pictures of your heart. For further information please visit [email or web address] Please follow instruction sheet as given.  Follow-Up: At [facility], you and your health needs are our priority.  As part of our continuing mission to provide you with exceptional heart care, we have created designated Provider Care Teams.  These Care Teams include your primary Cardiologist (physician) and Advanced Practice Providers (APPs -  Physician Assistants and Nurse Practitioners) who all work together to provide you with the care you need, when you need it.  Your next appointment:   6 month(s)  The format for your next appointment:   In Person  Provider:   Suzzanne Cloud, or Nahser   Other  Instructions   Your cardiac CT will be scheduled at:   [facility] [address] [place], [place] [address] [phone]  please arrive at the [facility] and Children's Entrance (Entrance C2) of [facility] 30 minutes prior to test start time. You can use the FREE valet parking offered at entrance C (encouraged to control the heart rate for the test)  Proceed to the [facility] Radiology Department (first floor) to check-in and test prep.  All radiology patients and guests should use entrance C2 at [facility], accessed from [facility], even though the hospital's physical address listed is [address].    Please follow these instructions carefully (unless otherwise directed):  Hold all erectile dysfunction medications at least 3 days (72 hrs) prior to test.  On the Night Before the Test: Be sure to Drink plenty of water. Do not consume any caffeinated/decaffeinated beverages or chocolate 12 hours prior to your test. Do not take any antihistamines 12 hours prior to your test.  On the Day of the Test: Drink plenty of water until 1 hour prior to the test. Do not eat any food 4 hours prior to the test. You may take your regular medications prior to the test.  Take metoprolol (Lopressor) two hours prior to test. HOLD Furosemide/Spironolactone the morning of the test.      After the Test: Drink plenty of water. After receiving IV contrast, you may experience a mild flushed feeling. This is normal. On occasion, you may experience a mild rash up to 24 hours after the test. This is not dangerous. If  this occurs, you can take Benadryl 25 mg and increase your fluid intake. If you experience trouble breathing, this can be serious. If it is severe call 911 IMMEDIATELY. If it is mild, please call our office. If you take any of these medications: Glipizide/Metformin, Avandament, Glucavance, please do not take 48 hours after completing test  unless otherwise instructed.  We will call to schedule your test 2-4 weeks out understanding that some insurance companies will need an authorization prior to the service being performed.   For non-scheduling related questions, please contact the cardiac imaging nurse navigator should you have any questions/concerns: Rockwell [place], Cardiac Imaging Nurse Navigator Larey Brick, Cardiac Imaging Nurse Navigator [facility] Direct Office Dial: [phone]   For scheduling needs, including cancellations and rescheduling, please call [place], [phone].   Important Information About Sugar

## 2022-01-03 NOTE — Progress Notes (Signed)
Cardiology Office Note:    Date:  01/03/2022   ID:  Vernon Reed, DOB 08-21-1975, MRN [id number]  PCP:  Dorothyann Peng, NP  Cardiologist:  Wyvonne Lenz, previous CHF clinic  Electrophysiologist:  None   Referring MD: Dorothyann Peng, NP   No chief complaint on file.   April 14, 2019   Vernon Reed is a 46 y.o. male with a hx of chronic systolic congestive heart failure.  He has been seen by the heart failure clinic for the past several years and his ejection fraction is now normal.  He has moderate left ventricular hypertrophy.  He has grade 1 diastolic dysfunction.  Takes his meds,  Tries to avoid salt .  No CP  Able to do his normal activties  has been getting some exercise  Knows that he needs to lose some weight .  Has OSA , he wears a CPAP mask. Heart cath in 2015 revealed normal coronary arteries.  Works at [employer]  Is in a band  - (Vernon Reed)  ( keyboard, singer )  February 06, 2020: Vernon Reed is seen today for follow-up of his congestive heart failure, hypertension. He has a hx of obesity .   Wt today is 307 lbs.  Has not lost any weight .    Busy with the band  Sings with [employer] for past several years  Still with Vernon Reed also Is not workint out.    Eats unhealthy foods frequently  - after playing a jig, only fast foods are available  We discussed the fact that as he lost weight we could gradually cut back on some of the medications.  He needs to work on weight loss and improvement of his diet before we start cutting back on his medications.  May 21 2021:   Vernon Reed is seen today for follow-up visit for his congestive heart failure.  He has been eating lots of salty foods.  He plays in a band and does not always eat the healthiest food. Wt is 305 lbs.  Works at [employer] in a band  (Vernon Reed ) - vocals, Vernon Reed,  Very busy  Playing with the [employer]ters on Dec. 10 in [place] No CP or dyspnea.  .  Getting some exercise  On coreg, entresto, hydralazine   Takes the lasix PRN, - perhaps QOD   Cardiology Office Note:    Date:  01/03/2022   ID:  Vernon [place], DOB [birth year], MRN [id number]  PCP:  Dorothyann Peng, NP  Cardiologist:  Wyvonne Lenz, previous CHF clinic  Electrophysiologist:  None   Referring MD: Dorothyann Peng, NP   No chief complaint on file.   April 14, 2019   Vernon Reed is a 46 y.o. male with a hx of chronic systolic congestive heart failure.  He has been seen by the heart failure clinic for the past several years and his ejection fraction is now normal.  He has moderate left ventricular hypertrophy.  He has grade 1 diastolic dysfunction.  Takes his meds,  Tries to avoid salt .  No CP  Able to do his normal activties  has been getting some exercise  Knows that he needs to lose some weight .  Has OSA , he wears a CPAP mask. Heart cath in 2015 revealed normal coronary arteries.  Works at [employer]  Is in a band  - (Vernon Reed)  ( keyboard, singer )  February 06, 2020: Vernon Reed is seen today for follow-up of his congestive heart failure, hypertension. He has a  hx of obesity .   Wt today is 307 lbs.  Has not lost any weight .    Busy with the band  Sings with the [employer] for past several years  Still with Vernon Reed also Is not workint out.    Eats unhealthy foods frequently  - after playing a jig, only fast foods are available  We discussed the fact that as he lost weight we could gradually cut back on some of the medications.  He needs to work on weight loss and improvement of his diet before we start cutting back on his medications.  June 30 , 2023  Vernon Reed is seen   Has been having some fluttering in his chest  Chest tightness  May have been stress.  Wt is 305  Diet has been poor  Vague chest discomfort. Fatigued Not so much shortness of breath    Past Medical History:  Diagnosis Date   AKI (acute kidney injury) ([place]) 07/27/2013   Cardiomyopathy ([place]) 07/25/2013   CHF (congestive heart failure) ([place])     Chicken pox    Chronic systolic heart failure ([place]) 01/04/2014   GERD (gastroesophageal reflux disease)    Headache    "maybe 5 times/yr; nothing regular" (12/06/2014)   Heart block 07/25/2013   Hyperlipemia    Hypertension    Hypokalemia 07/26/2013   Hypoxia 12/06/2014   Obesity, morbid ([place]) 12/27/2013   OSA (obstructive sleep apnea)    Sleep apnea    SOB (shortness of breath) 07/23/2013   Tachy-brady syndrome ([place]) 07/26/2013   Tachycardia 07/23/2013   Type II diabetes mellitus ([place])     Past Surgical History:  Procedure Laterality Date   ABDOMINAL SURGERY  ~ 1987   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   APPENDECTOMY  ~ [place]  07/2013   LEFT AND RIGHT HEART CATHETERIZATION WITH CORONARY ANGIOGRAM N/A 07/26/2013   Procedure: LEFT AND RIGHT HEART CATHETERIZATION WITH CORONARY ANGIOGRAM;  Surgeon: Leonie Man, MD;  Location: [facility] CATH LAB;  Service: Cardiovascular;  Laterality: N/A;    Current Medications: Current Meds  Medication Sig   aspirin 81 MG EC tablet Take 1 tablet (81 mg total) by mouth daily.   Blood Glucose Monitoring Suppl (TRUE METRIX METER) W/DEVICE KIT 1 each by Does not apply route once as needed.   carvedilol (COREG) 12.5 MG tablet TAKE 1.5 TABLETS BY MOUTH 2 (TWO) TIMES DAILY WITH A MEAL. LAST REFILL WITHOUT OFFICE VISIT   ENTRESTO 97-103 MG TAKE 1 TABLET BY MOUTH 2 (TWO) TIMES DAILY.   furosemide (LASIX) 40 MG tablet TAKE 1 TABLET BY MOUTH EVERY DAY AS NEEDED   glucose blood (TRUE METRIX BLOOD GLUCOSE TEST) test strip Used 3 times daily before meals.   hydrALAZINE (APRESOLINE) 50 MG tablet Take 1 tablet (50 mg total) by mouth 3 (three) times daily.   isosorbide mononitrate (IMDUR) 30 MG 24 hr tablet TAKE 1 TABLET BY MOUTH EVERY DAY   metoprolol tartrate (LOPRESSOR) 100 MG tablet Take 1 tablet by mouth two hours prior to scan   potassium chloride (KLOR-CON) 10 MEQ tablet TAKE 1 TABLET BY MOUTH EVERY DAY   Semaglutide, 1 MG/DOSE, (OZEMPIC,  1 MG/DOSE,) 4 MG/3ML SOPN Inject 1 mg into the skin once a week.   spironolactone (ALDACTONE) 25 MG tablet TAKE 1 TABLET BY MOUTH EVERY DAY   TRUEPLUS LANCETS 30G MISC 1 each by Does not apply route every morning.     Allergies:   Patient has no  known allergies.   Social History   Socioeconomic History   Marital status: Divorced    Spouse name: Not on file   Number of children: 3   Years of education: Not on file   Highest education level: Bachelor's degree (e.g., BA, AB, BS)  Occupational History   Occupation: Herbalist: [employer]  Tobacco Use   Smoking status: Never   Smokeless tobacco: Never  Vaping Use   Vaping Use: Never used  Substance and Sexual Activity   Alcohol use: No    Comment: 12/06/2014 "might have a beer a couple times/month"   Drug use: No   Sexual activity: Yes  Other Topics Concern   Not on file  Social History Narrative   Librarian, 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       Social Determinants of Health   Financial Resource Strain: Medium Risk (06/03/2021)   Overall Financial Resource Strain (CARDIA)    Difficulty of Paying Living Expenses: Somewhat hard  Food Insecurity: Food Insecurity Present (06/03/2021)   Hunger Vital Sign    Worried About Running Out of Food in the Last Year: Sometimes true    Ran Out of Food in the Last Year: Never true  Transportation Needs: No Transportation Needs (06/03/2021)   PRAPARE - Hydrologist (Medical): No    Lack of Transportation (Non-Medical): No  Physical Activity: Insufficiently Active (06/03/2021)   Exercise Vital Sign    Days of Exercise per Week: 2 days    Minutes of Exercise per Session: 30 min  Stress: No Stress Concern Present (06/03/2021)   [place]    Feeling of Stress : Only a little  Social Connections: Moderately Integrated (06/03/2021)   Social Connection and Isolation  Panel [NHANES]    Frequency of Communication with Friends and Family: More than three times a week    Frequency of Social Gatherings with Friends and Family: Twice a week    Attends Religious Services: More than 4 times per year    Active Member of [employer] or Organizations: Yes    Attends Music therapist: More than 4 times per year    Marital Status: Divorced     Family History: The patient's family history includes Hypertension in his father and mother; Sleep apnea in his father.  ROS:   Please see the history of present illness.     All other systems reviewed and are negative.  EKGs/Labs/Other Studies Reviewed:    The following studies were reviewed today:   EKG:     Recent Labs: 03/08/2021: ALT 25; BUN 15; Creatinine, Ser 1.07; Hemoglobin 12.5; Platelets 275.0; Potassium 4.0; Sodium 140; TSH 2.74  Recent Lipid Panel    Component Value Date/Time   CHOL 157 03/08/2021 0808   TRIG 114.0 03/08/2021 0808   HDL 25.90 (L) 03/08/2021 0808   CHOLHDL 6 03/08/2021 0808   VLDL 22.8 03/08/2021 0808   LDLCALC 108 (H) 03/08/2021 0808   LDLCALC 111 (H) 02/02/2020 0836    Physical Exam:    Physical Exam: Blood pressure 126/70, pulse 98, height 6' (1.829 m), weight (!) 305 lb 12.8 oz (138.7 kg), SpO2 95 %.  GEN:  morbidly obese male,  NAD  HEENT: Normal NECK: No JVD; No carotid bruits LYMPHATICS: No lymphadenopathy CARDIAC: RRR , no murmurs, rubs, gallops RESPIRATORY:  Clear to auscultation without rales, wheezing or rhonchi  ABDOMEN: Soft, non-tender, non-distended  MUSCULOSKELETAL:  No edema; No deformity  SKIN: Warm and dry NEUROLOGIC:  Alert and oriented x 3     ASSESSMENT:    1. Chest pain on breathing   2. Chronic diastolic heart failure ([place])   3. Primary hypertension   4. Pre-procedural cardiovascular examination     PLAN:       1.  History of chronic systolic congestive heart failure:   He is having lots of generalized fatigue.  His last  echocardiogram showed normal LV systolic function but with grade 1 diastolic function.  We will repeat his echocardiogram.  He admits to eating fairly unhealthy diet.  2.  Chest pain: He has lots of various aches and pains especially in his chest.  We will order a coronary CT angiogram for further evaluation.  His last lipid levels appear to be fairly well controlled.   Follow-up with [place] in 6 months.      Medication Adjustments/Labs and Tests Ordered: Current medicines are reviewed at length with the patient today.  Concerns regarding medicines are outlined above.  Orders Placed This Encounter  Procedures   CT CORONARY MORPH W/CTA COR W/SCORE W/CA W/CM &/OR WO/CM   CBC   Basic metabolic panel   ECHOCARDIOGRAM COMPLETE    Meds ordered this encounter  Medications   metoprolol tartrate (LOPRESSOR) 100 MG tablet    Sig: Take 1 tablet by mouth two hours prior to scan    Dispense:  1 tablet    Refill:  0       Patient Instructions  Medication Instructions:  Your physician recommends that you continue on your current medications as directed. Please refer to the Current Medication list given to you today.  *If you need a refill on your cardiac medications before your next appointment, please call your pharmacy*   Lab Work: CBC, BMET Today If you have labs (blood work) drawn today and your tests are completely normal, you will receive your results only by: [place] (if you have MyChart) OR A paper copy in the mail If you have any lab test that is abnormal or we need to change your treatment, we will call you to review the results.   Testing/Procedures: ECHO Your physician has requested that you have an echocardiogram. Echocardiography is a painless test that uses sound waves to create images of your heart. It provides your doctor with information about the size and shape of your heart and how well your heart's chambers and valves are working. This procedure takes  approximately one hour. There are no restrictions for this procedure.  Coronary CT Angiogram Your physician has requested that you have cardiac CT. Cardiac computed tomography (CT) is a painless test that uses an x-ray machine to take clear, detailed pictures of your heart. For further information please visit HugeFiesta.[email or web address]. Please follow instruction sheet as given.  Follow-Up: At [facility], you and your health needs are our priority.  As part of our continuing mission to provide you with exceptional heart care, we have created designated Provider Care Teams.  These Care Teams include your primary Cardiologist (physician) and Advanced Practice Providers (APPs -  Physician Assistants and Nurse Practitioners) who all work together to provide you with the care you need, when you need it.  Your next appointment:   6 month(s)  The format for your next appointment:   In Person  Provider:   Ronn Melena, or [place]   Other Instructions   Your cardiac CT will be scheduled at:  [facility] [address] [place], [place] [address] [phone]  please arrive at the [facility] and Children's Entrance (Entrance C2) of [facility] 30 minutes prior to test start time. You can use the FREE valet parking offered at entrance C (encouraged to control the heart rate for the test)  Proceed to the [facility] Radiology Department (first floor) to check-in and test prep.  All radiology patients and guests should use entrance C2 at [facility], accessed from [facility], even though the hospital's physical address listed is [address]    Please follow these instructions carefully (unless otherwise directed):  Hold all erectile dysfunction medications at least 3 days (72 hrs) prior to test.  On the Night Before the Test: Be sure to Drink plenty of water. Do not consume any caffeinated/decaffeinated beverages or chocolate 12 hours  prior to your test. Do not take any antihistamines 12 hours prior to your test.  On the Day of the Test: Drink plenty of water until 1 hour prior to the test. Do not eat any food 4 hours prior to the test. You may take your regular medications prior to the test.  Take metoprolol (Lopressor) two hours prior to test. HOLD Furosemide/Spironolactone the morning of the test.      After the Test: Drink plenty of water. After receiving IV contrast, you may experience a mild flushed feeling. This is normal. On occasion, you may experience a mild rash up to 24 hours after the test. This is not dangerous. If this occurs, you can take Benadryl 25 mg and increase your fluid intake. If you experience trouble breathing, this can be serious. If it is severe call 911 IMMEDIATELY. If it is mild, please call our office. If you take any of these medications: Glipizide/Metformin, Avandament, Glucavance, please do not take 48 hours after completing test unless otherwise instructed.  We will call to schedule your test 2-4 weeks out understanding that some insurance companies will need an authorization prior to the service being performed.   For non-scheduling related questions, please contact the cardiac imaging nurse navigator should you have any questions/concerns: Marchia Bond, Cardiac Imaging Nurse Navigator Gordy Clement, Cardiac Imaging Nurse Navigator [facility] Direct Office Dial: [phone]   For scheduling needs, including cancellations and rescheduling, please call [place], [phone].   Important Information About Sugar         Signed, Mertie Moores, MD  01/03/2022 3:05 PM    [facility]  Has DM - HBA1C was 7.6   Past Medical History:  Diagnosis Date   AKI (acute kidney injury) ([place]) 07/27/2013   Cardiomyopathy ([place]) 07/25/2013   CHF (congestive heart failure) ([place])    Chicken pox    Chronic systolic heart failure ([place])  01/04/2014   GERD (gastroesophageal reflux disease)    Headache    "maybe 5 times/yr; nothing regular" (12/06/2014)   Heart block 07/25/2013   Hyperlipemia    Hypertension    Hypokalemia 07/26/2013   Hypoxia 12/06/2014   Obesity, morbid ([place]) 12/27/2013   OSA (obstructive sleep apnea)    Sleep apnea    SOB (shortness of breath) 07/23/2013   Tachy-brady syndrome ([place]) 07/26/2013   Tachycardia 07/23/2013   Type II diabetes mellitus ([place])     Past Surgical History:  Procedure Laterality Date   ABDOMINAL SURGERY  ~ 1987   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   APPENDECTOMY  ~ 1989  CARDIAC CATHETERIZATION  07/2013   LEFT AND RIGHT HEART CATHETERIZATION WITH CORONARY ANGIOGRAM N/A 07/26/2013   Procedure: LEFT AND RIGHT HEART CATHETERIZATION WITH CORONARY ANGIOGRAM;  Surgeon: Leonie Man, MD;  Location: [facility] CATH LAB;  Service: Cardiovascular;  Laterality: N/A;    Current Medications: Current Meds  Medication Sig   aspirin 81 MG EC tablet Take 1 tablet (81 mg total) by mouth daily.   Blood Glucose Monitoring Suppl (TRUE METRIX METER) W/DEVICE KIT 1 each by Does not apply route once as needed.   carvedilol (COREG) 12.5 MG tablet TAKE 1.5 TABLETS BY MOUTH 2 (TWO) TIMES DAILY WITH A MEAL. LAST REFILL WITHOUT OFFICE VISIT   ENTRESTO 97-103 MG TAKE 1 TABLET BY MOUTH 2 (TWO) TIMES DAILY.   furosemide (LASIX) 40 MG tablet TAKE 1 TABLET BY MOUTH EVERY DAY AS NEEDED   glucose blood (TRUE METRIX BLOOD GLUCOSE TEST) test strip Used 3 times daily before meals.   hydrALAZINE (APRESOLINE) 50 MG tablet Take 1 tablet (50 mg total) by mouth 3 (three) times daily.   isosorbide mononitrate (IMDUR) 30 MG 24 hr tablet TAKE 1 TABLET BY MOUTH EVERY DAY   metoprolol tartrate (LOPRESSOR) 100 MG tablet Take 1 tablet by mouth two hours prior to scan   potassium chloride (KLOR-CON) 10 MEQ tablet TAKE 1 TABLET BY MOUTH EVERY DAY   Semaglutide, 1 MG/DOSE, (OZEMPIC, 1 MG/DOSE,) 4 MG/3ML SOPN Inject 1 mg into the skin once  a week.   spironolactone (ALDACTONE) 25 MG tablet TAKE 1 TABLET BY MOUTH EVERY DAY   TRUEPLUS LANCETS 30G MISC 1 each by Does not apply route every morning.     Allergies:   Patient has no known allergies.   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       Social Determinants of Health   Financial Resource Strain: Medium Risk (06/03/2021)   Overall Financial Resource Strain (CARDIA)    Difficulty of Paying Living Expenses: Somewhat hard  Food Insecurity: Food Insecurity Present (06/03/2021)   Hunger Vital Sign    Worried About Running Out of Food in the Last Year: Sometimes true    Ran Out of Food in the Last Year: Never true  Transportation Needs: No Transportation Needs (06/03/2021)   PRAPARE - Hydrologist (Medical): No    Lack of Transportation (Non-Medical): No  Physical Activity: Insufficiently Active (06/03/2021)   Exercise Vital Sign    Days of Exercise per Week: 2 days    Minutes of Exercise per Session: 30 min  Stress: No Stress Concern Present (06/03/2021)   [place]    Feeling of Stress : Only a little  Social Connections: Moderately Integrated (06/03/2021)  Social Licensed conveyancer [NHANES]    Frequency of Communication with Friends  and Family: More than three times a week    Frequency of Social Gatherings with Friends and Family: Twice a week    Attends Religious Services: More than 4 times per year    Active Member of [employer] or Organizations: Yes    Attends Music therapist: More than 4 times per year    Marital Status: Divorced     Family History: The patient's family history includes Hypertension in his father and mother; Sleep apnea in his father.  ROS:   Please see the history of present illness.     All other systems reviewed and are negative.  EKGs/Labs/Other Studies Reviewed:    The following studies were reviewed today:   EKG:      Recent Labs: 03/08/2021: ALT 25; BUN 15; Creatinine, Ser 1.07; Hemoglobin 12.5; Platelets 275.0; Potassium 4.0; Sodium 140; TSH 2.74  Recent Lipid Panel    Component Value Date/Time   CHOL 157 03/08/2021 0808   TRIG 114.0 03/08/2021 0808   HDL 25.90 (L) 03/08/2021 0808   CHOLHDL 6 03/08/2021 0808   VLDL 22.8 03/08/2021 0808   LDLCALC 108 (H) 03/08/2021 0808   LDLCALC 111 (H) 02/02/2020 0836    Physical Exam:        ASSESSMENT:    1. Chest pain on breathing   2. Chronic diastolic heart failure ([place])   3. Primary hypertension   4. Pre-procedural cardiovascular examination     PLAN:       1.  History of chronic systolic congestive heart failure:     Overall sleep seems to be doing fairly well.  He admits that he has not been paying close attention to his diet.  I encouraged him to work on cutting out his carbohydrates in his [facility].  Of asked him to exercise on a regular basis.  We will repeat his echocardiogram.  2.  Shortness of breath on exertion: He is having some symptoms that could be an anginal equivalent.  He does not appear to be fluid overloaded by exam.  I am worried about the the possibility of coronary artery disease.  We will schedule him for a coronary CT angiogram.       Medication Adjustments/Labs and Tests  Ordered: Current medicines are reviewed at length with the patient today.  Concerns regarding medicines are outlined above.  Orders Placed This Encounter  Procedures   CT CORONARY MORPH W/CTA COR W/SCORE W/CA W/CM &/OR WO/CM   CBC   Basic metabolic panel   ECHOCARDIOGRAM COMPLETE    Meds ordered this encounter  Medications   metoprolol tartrate (LOPRESSOR) 100 MG tablet    Sig: Take 1 tablet by mouth two hours prior to scan    Dispense:  1 tablet    Refill:  0     Patient Instructions  Medication Instructions:  Your physician recommends that you continue on your current medications as directed. Please refer to the Current Medication list given to you today.  *If you need a refill on your cardiac medications before your next appointment, please call your pharmacy*   Lab Work: CBC, BMET Today If you have labs (blood work) drawn today and your tests are completely normal, you will receive your results only by: [place] (if you have MyChart) OR A paper copy in the mail If you have any lab test that is abnormal or we need to change your treatment,  we will call you to review the results.   Testing/Procedures: ECHO Your physician has requested that you have an echocardiogram. Echocardiography is a painless test that uses sound waves to create images of your heart. It provides your doctor with information about the size and shape of your heart and how well your heart's chambers and valves are working. This procedure takes approximately one hour. There are no restrictions for this procedure.  Coronary CT Angiogram Your physician has requested that you have cardiac CT. Cardiac computed tomography (CT) is a painless test that uses an x-ray machine to take clear, detailed pictures of your heart. For further information please visit HugeFiesta.[email or web address]. Please follow instruction sheet as given.  Follow-Up: At [facility], you and your health needs are our priority.  As part of  our continuing mission to provide you with exceptional heart care, we have created designated Provider Care Teams.  These Care Teams include your primary Cardiologist (physician) and Advanced Practice Providers (APPs -  Physician Assistants and Nurse Practitioners) who all work together to provide you with the care you need, when you need it.  Your next appointment:   6 month(s)  The format for your next appointment:   In Person  Provider:   Ronn Melena, or Ebonee Stober   Other Instructions   Your cardiac CT will be scheduled at:   [facility] [address] [place], [place] [address] [phone]  please arrive at the [facility] and Children's Entrance (Entrance C2) of [facility] 30 minutes prior to test start time. You can use the FREE valet parking offered at entrance C (encouraged to control the heart rate for the test)  Proceed to the [facility] Radiology Department (first floor) to check-in and test prep.  All radiology patients and guests should use entrance C2 at [facility], accessed from [facility], even though the hospital's physical address listed is [address].    Please follow these instructions carefully (unless otherwise directed):  Hold all erectile dysfunction medications at least 3 days (72 hrs) prior to test.  On the Night Before the Test: Be sure to Drink plenty of water. Do not consume any caffeinated/decaffeinated beverages or chocolate 12 hours prior to your test. Do not take any antihistamines 12 hours prior to your test.  On the Day of the Test: Drink plenty of water until 1 hour prior to the test. Do not eat any food 4 hours prior to the test. You may take your regular medications prior to the test.  Take metoprolol (Lopressor) two hours prior to test. HOLD Furosemide/Spironolactone the morning of the test.      After the Test: Drink plenty of water. After receiving IV contrast, you may  experience a mild flushed feeling. This is normal. On occasion, you may experience a mild rash up to 24 hours after the test. This is not dangerous. If this occurs, you can take Benadryl 25 mg and increase your fluid intake. If you experience trouble breathing, this can be serious. If it is severe call 911 IMMEDIATELY. If it is mild, please call our office. If you take any of these medications: Glipizide/Metformin, Avandament, Glucavance, please do not take 48 hours after completing test unless otherwise instructed.  We will call to schedule your test 2-4 weeks out understanding that some insurance companies will need an authorization prior to the service being performed.   For non-scheduling related questions, please contact the cardiac imaging nurse navigator should you  have any questions/concerns: Marchia Bond, Cardiac Imaging Nurse Navigator Gordy Clement, Cardiac Imaging Nurse Navigator [facility] Direct Office Dial: [phone]   For scheduling needs, including cancellations and rescheduling, please call [place], [phone].   Important Information About Sugar         Signed, Mertie Moores, MD  01/03/2022 3:05 PM    [facility]

## 2022-01-04 LAB — BASIC METABOLIC PANEL
BUN/Creatinine Ratio: 11 (ref 9–20)
BUN: 13 mg/dL (ref 6–24)
CO2: 23 mmol/L (ref 20–29)
Calcium: 9.3 mg/dL (ref 8.7–10.2)
Chloride: 104 mmol/L (ref 96–106)
Creatinine, Ser: 1.14 mg/dL (ref 0.76–1.27)
Glucose: 71 mg/dL (ref 70–99)
Potassium: 4.4 mmol/L (ref 3.5–5.2)
Sodium: 142 mmol/L (ref 134–144)
eGFR: 80 mL/min/{1.73_m2} (ref >59)

## 2022-01-04 LAB — CBC
Hematocrit: 42.3 % (ref 37.5–51.0)
Hemoglobin: 13.9 g/dL (ref 13.0–17.7)
MCH: 30.1 pg (ref 26.6–33.0)
MCHC: 32.9 g/dL (ref 31.5–35.7)
MCV: 92 fL (ref 79–97)
Platelets: 295 10*3/uL (ref 150–450)
RBC: 4.62 x10E6/uL (ref 4.14–5.80)
RDW: 13.1 % (ref 11.6–15.4)
WBC: 10.1 10*3/uL (ref 3.4–10.8)

## 2022-01-22 ENCOUNTER — Ambulatory Visit (HOSPITAL_COMMUNITY): Payer: BC Managed Care – PPO

## 2022-01-27 ENCOUNTER — Telehealth (HOSPITAL_COMMUNITY): Payer: Self-pay | Admitting: Emergency Medicine

## 2022-01-27 NOTE — Telephone Encounter (Signed)
Attempted to call patient regarding upcoming cardiac CT appointment. °Left message on voicemail with name and callback number °Christee Mervine RN Navigator Cardiac Imaging °[facility] °[phone] Office °[phone] Cell ° °

## 2022-01-28 ENCOUNTER — Ambulatory Visit (HOSPITAL_COMMUNITY): Admission: RE | Admit: 2022-01-28 | Payer: BC Managed Care – PPO | Source: Ambulatory Visit

## 2022-02-05 ENCOUNTER — Encounter (HOSPITAL_COMMUNITY): Payer: Self-pay | Admitting: Cardiovascular Disease

## 2022-02-05 ENCOUNTER — Ambulatory Visit (HOSPITAL_COMMUNITY): Payer: BC Managed Care – PPO | Attending: Internal Medicine

## 2022-02-24 ENCOUNTER — Other Ambulatory Visit (HOSPITAL_COMMUNITY): Payer: Self-pay | Admitting: Cardiology

## 2022-02-24 ENCOUNTER — Other Ambulatory Visit: Payer: Self-pay | Admitting: Cardiovascular Disease

## 2022-02-24 DIAGNOSIS — I10 Essential (primary) hypertension: Secondary | ICD-10-CM

## 2022-02-24 DIAGNOSIS — I5022 Chronic systolic (congestive) heart failure: Secondary | ICD-10-CM

## 2022-03-11 ENCOUNTER — Encounter (HOSPITAL_COMMUNITY): Payer: Self-pay

## 2022-03-11 ENCOUNTER — Encounter: Payer: BC Managed Care – PPO | Admitting: Adult Health

## 2022-03-11 ENCOUNTER — Telehealth: Payer: Self-pay | Admitting: Adult Health

## 2022-03-11 DIAGNOSIS — E1169 Type 2 diabetes mellitus with other specified complication: Secondary | ICD-10-CM

## 2022-03-11 NOTE — Progress Notes (Deleted)
Subjective:    Patient ID: Vernon Reed, male    DOB: 03-13-1976, 46 y.o.   MRN: [id number]  HPI Patient presents for yearly preventative medicine examination. He is a pleasant 46 year old male who  has a past medical history of AKI (acute kidney injury) ([place]) (07/27/2013), Cardiomyopathy ([place]) (07/25/2013), CHF (congestive heart failure) ([place]), Chicken pox, Chronic systolic heart failure ([place]) (01/04/2014), GERD (gastroesophageal reflux disease), Headache, Heart block (07/25/2013), Hyperlipemia, Hypertension, Hypokalemia (07/26/2013), Hypoxia (12/06/2014), Obesity, morbid ([place]) (12/27/2013), OSA (obstructive sleep apnea), Sleep apnea, SOB (shortness of breath) (07/23/2013), Tachy-brady syndrome ([place]) (07/26/2013), Tachycardia (07/23/2013), and Type II diabetes mellitus ([place]).  DM Type 2 -currently maintained on Ozempic 1 mg weekly.  In the past he was on [facility] but his savings card expired and he could not afford the medication.  He has not had any side effects with Ozempic.  He is trying to eat healthy Wt Readings from Last 3 Encounters:  01/03/22 (!) 305 lb 12.8 oz (138.7 kg)  12/06/21 (!) 304 lb (137.9 kg)  09/05/21 296 lb (134.3 kg)   Lab Results  Component Value Date   HGBA1C 5.6 (A) 12/06/2021   Hypertension-managed with Coreg 12.5 mg twice daily, Entresto 97-103 mg daily, Lasix 40 mg daily, hydralazine 75 mg 3 times daily, spironolactone 25 mg daily, and Imdur 30 mg daily.  He denies dizziness, lightheadedness, chest pain, or shortness of breath BP Readings from Last 3 Encounters:  01/03/22 126/70  12/06/21 120/86  09/05/21 102/78   OSA  CHF-managed by cardiology.  He denies chest pain, shortness of breath, dyspnea, DOE.   All immunizations and health maintenance protocols were reviewed with the patient and needed orders were placed.  Appropriate screening laboratory values were ordered for the patient including screening of hyperlipidemia, renal function and hepatic function. If  indicated by BPH, a PSA was ordered.  Medication reconciliation,  past medical history, social history, problem list and allergies were reviewed in detail with the patient  Goals were established with regard to weight loss, exercise, and  diet in compliance with medications  Review of Systems  Constitutional: Negative.   HENT: Negative.    Eyes: Negative.   Respiratory: Negative.    Cardiovascular: Negative.   Gastrointestinal: Negative.   Endocrine: Negative.   Genitourinary: Negative.   Musculoskeletal: Negative.   Skin: Negative.   Allergic/Immunologic: Negative.   Neurological: Negative.   Hematological: Negative.   Psychiatric/Behavioral: Negative.    All other systems reviewed and are negative.  Past Medical History:  Diagnosis Date   AKI (acute kidney injury) ([place]) 07/27/2013   Cardiomyopathy ([place]) 07/25/2013   CHF (congestive heart failure) ([place])    Chicken pox    Chronic systolic heart failure ([place]) 01/04/2014   GERD (gastroesophageal reflux disease)    Headache    "maybe 5 times/yr; nothing regular" (12/06/2014)   Heart block 07/25/2013   Hyperlipemia    Hypertension    Hypokalemia 07/26/2013   Hypoxia 12/06/2014   Obesity, morbid ([place]) 12/27/2013   OSA (obstructive sleep apnea)    Sleep apnea    SOB (shortness of breath) 07/23/2013   Tachy-brady syndrome ([place]) 07/26/2013   Tachycardia 07/23/2013   Type II diabetes mellitus ([place])     Social History   Socioeconomic History   Marital status: Divorced    Spouse name: Not on file   Number of children: 3   Years of education: Not on file   Highest education level: Bachelor's degree (e.g., BA, AB, BS)  Occupational History   Occupation: Herbalist: [employer]  Tobacco Use   Smoking status: Never   Smokeless tobacco: Never  Vaping Use   Vaping Use: Never used  Substance and Sexual Activity   Alcohol use: No    Comment: 12/06/2014 "might have a beer a couple times/month"   Drug use: No   Sexual  activity: Yes  Other Topics Concern   Not on file  Social History Narrative   Librarian, 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       Social Determinants of Health   Financial Resource Strain: Medium Risk (06/03/2021)   Overall Financial Resource Strain (CARDIA)    Difficulty of Paying Living Expenses: Somewhat hard  Food Insecurity: Food Insecurity Present (06/03/2021)   Hunger Vital Sign    Worried About Running Out of Food in the Last Year: Sometimes true    Ran Out of Food in the Last Year: Never true  Transportation Needs: No Transportation Needs (06/03/2021)   PRAPARE - Hydrologist (Medical): No    Lack of Transportation (Non-Medical): No  Physical Activity: Insufficiently Active (06/03/2021)   Exercise Vital Sign    Days of Exercise per Week: 2 days    Minutes of Exercise per Session: 30 min  Stress: No Stress Concern Present (06/03/2021)   [place]    Feeling of Stress : Only a little  Social Connections: Moderately Integrated (06/03/2021)   Social Connection and Isolation Panel [NHANES]    Frequency of Communication with Friends and Family: More than three times a week    Frequency of Social Gatherings with Friends and Family: Twice a week    Attends Religious Services: More than 4 times per year    Active Member of [employer] or Organizations: Yes    Attends Music therapist: More than 4 times per year    Marital Status: Divorced  Human resources officer Violence: Not on file    Past Surgical History:  Procedure Laterality Date   ABDOMINAL SURGERY  ~ 1987   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   APPENDECTOMY  ~ [place]  07/2013   LEFT AND RIGHT HEART CATHETERIZATION WITH CORONARY ANGIOGRAM N/A 07/26/2013   Procedure: LEFT AND RIGHT HEART CATHETERIZATION WITH CORONARY ANGIOGRAM;  Surgeon: Leonie Man, MD;  Location: [facility] CATH LAB;   Service: Cardiovascular;  Laterality: N/A;    Family History  Problem Relation Age of Onset   Hypertension Mother    Hypertension Father    Sleep apnea Father     No Known Allergies  Current Outpatient Medications on File Prior to Visit  Medication Sig Dispense Refill   aspirin 81 MG EC tablet Take 1 tablet (81 mg total) by mouth daily. 30 tablet 6   Blood Glucose Monitoring Suppl (TRUE METRIX METER) W/DEVICE KIT 1 each by Does not apply route once as needed. 1 kit 0   carvedilol (COREG) 12.5 MG tablet TAKE 1.5 TABLETS BY MOUTH 2 (TWO) TIMES DAILY WITH A MEAL. LAST REFILL WITHOUT OFFICE VISIT 270 tablet 3   furosemide (LASIX) 40 MG tablet TAKE 1 TABLET BY MOUTH EVERY DAY AS NEEDED 90 tablet 3   glucose blood (TRUE METRIX BLOOD GLUCOSE TEST) test strip Used 3 times daily before meals. 100 each 12   hydrALAZINE (APRESOLINE) 50 MG tablet Take 1 tablet (50 mg total) by mouth 3 (three) times daily.  90 tablet 7   isosorbide mononitrate (IMDUR) 30 MG 24 hr tablet TAKE 1 TABLET BY MOUTH EVERY DAY 90 tablet 2   metoprolol tartrate (LOPRESSOR) 100 MG tablet Take 1 tablet by mouth two hours prior to scan 1 tablet 0   potassium chloride (KLOR-CON) 10 MEQ tablet TAKE 1 TABLET BY MOUTH EVERY DAY 90 tablet 1   sacubitril-valsartan (ENTRESTO) 97-103 MG Take 1 tablet by mouth 2 (two) times daily. NEEDS FOLLOW UP APPOINTMENT FOR ANYMORE REFILLS 180 tablet 0   spironolactone (ALDACTONE) 25 MG tablet TAKE 1 TABLET BY MOUTH EVERY DAY 90 tablet 2   TRUEPLUS LANCETS 30G MISC 1 each by Does not apply route every morning. 50 each 11   No current facility-administered medications on file prior to visit.    There were no vitals taken for this visit.      Objective:   Physical Exam Vitals and nursing note reviewed.  Constitutional:      General: He is not in acute distress.    Appearance: Normal appearance. He is well-developed and normal weight.  HENT:     Head: Normocephalic and atraumatic.     Right  Ear: Tympanic membrane, ear canal and external ear normal. There is no impacted cerumen.     Left Ear: Tympanic membrane, ear canal and external ear normal. There is no impacted cerumen.     Nose: Nose normal. No congestion or rhinorrhea.     Mouth/Throat:     Mouth: Mucous membranes are moist.     Pharynx: Oropharynx is clear. No oropharyngeal exudate or posterior oropharyngeal erythema.  Eyes:     General:        Right eye: No discharge.        Left eye: No discharge.     Extraocular Movements: Extraocular movements intact.     Conjunctiva/sclera: Conjunctivae normal.     Pupils: Pupils are equal, round, and reactive to light.  Neck:     Vascular: No carotid bruit.     Trachea: No tracheal deviation.  Cardiovascular:     Rate and Rhythm: Normal rate and regular rhythm.     Pulses: Normal pulses.     Heart sounds: Normal heart sounds. No murmur heard.    No friction rub. No gallop.  Pulmonary:     Effort: Pulmonary effort is normal. No respiratory distress.     Breath sounds: Normal breath sounds. No stridor. No wheezing, rhonchi or rales.  Chest:     Chest wall: No tenderness.  Abdominal:     General: Bowel sounds are normal. There is no distension.     Palpations: Abdomen is soft. There is no mass.     Tenderness: There is no abdominal tenderness. There is no right CVA tenderness, left CVA tenderness, guarding or rebound.     Hernia: No hernia is present.  Musculoskeletal:        General: No swelling, tenderness, deformity or signs of injury. Normal range of motion.     Right lower leg: No edema.     Left lower leg: No edema.  Lymphadenopathy:     Cervical: No cervical adenopathy.  Skin:    General: Skin is warm and dry.     Capillary Refill: Capillary refill takes less than 2 seconds.     Coloration: Skin is not jaundiced or pale.     Findings: No bruising, erythema, lesion or rash.  Neurological:     General: No focal deficit present.  Mental Status: He is alert  and oriented to person, place, and time.     Cranial Nerves: No cranial nerve deficit.     Sensory: No sensory deficit.     Motor: No weakness.     Coordination: Coordination normal.     Gait: Gait normal.     Deep Tendon Reflexes: Reflexes normal.  Psychiatric:        Mood and Affect: Mood normal.        Behavior: Behavior normal.        Thought Content: Thought content normal.        Judgment: Judgment normal.       Assessment & Plan:

## 2022-03-11 NOTE — Telephone Encounter (Signed)
Pt call and stated he need a refill on Semaglutide, 1 MG/DOSE, (OZEMPIC, 1 MG/DOSE,) 4 MG/3ML SOPN sent to  [id number] - [place], [place] - [address] Phone:  [phone]  Fax:  [phone]

## 2022-03-12 MED ORDER — OZEMPIC (1 MG/DOSE) 4 MG/3ML ~~LOC~~ SOPN: 1.0000 mg | PEN_INJECTOR | SUBCUTANEOUS | 0 refills | Status: DC

## 2022-03-12 NOTE — Telephone Encounter (Signed)
Pt rsc cpe for 03-27-2022 1 pm

## 2022-03-12 NOTE — Telephone Encounter (Signed)
Pt has appt 04/18/22 for CPE.  LVM to see if that can be moved into early mid Sept. Last CPE 03/08/21, Last OV 12/06/20. Provider OV notes requested a 3 mo f/u.

## 2022-03-27 ENCOUNTER — Encounter: Payer: Self-pay | Admitting: Adult Health

## 2022-03-27 ENCOUNTER — Ambulatory Visit (INDEPENDENT_AMBULATORY_CARE_PROVIDER_SITE_OTHER): Payer: BC Managed Care – PPO | Admitting: Adult Health

## 2022-03-27 ENCOUNTER — Other Ambulatory Visit (HOSPITAL_COMMUNITY)
Admission: RE | Admit: 2022-03-27 | Discharge: 2022-03-27 | Disposition: A | Discharge status: Home / Self Care | Payer: BC Managed Care – PPO | Source: Ambulatory Visit | Attending: Adult Health | Admitting: Adult Health

## 2022-03-27 ENCOUNTER — Other Ambulatory Visit (INDEPENDENT_AMBULATORY_CARE_PROVIDER_SITE_OTHER): Payer: BC Managed Care – PPO

## 2022-03-27 VITALS — BP 120/70 | HR 85 | Temp 98.4°F | Ht 71.25 in | Wt 311.0 lb

## 2022-03-27 DIAGNOSIS — I10 Essential (primary) hypertension: Secondary | ICD-10-CM | POA: Diagnosis not present

## 2022-03-27 DIAGNOSIS — Z113 Encounter for screening for infections with a predominantly sexual mode of transmission: Secondary | ICD-10-CM | POA: Insufficient documentation

## 2022-03-27 DIAGNOSIS — Z0000 Encounter for general adult medical examination without abnormal findings: Secondary | ICD-10-CM

## 2022-03-27 DIAGNOSIS — Z125 Encounter for screening for malignant neoplasm of prostate: Secondary | ICD-10-CM

## 2022-03-27 DIAGNOSIS — E1169 Type 2 diabetes mellitus with other specified complication: Secondary | ICD-10-CM

## 2022-03-27 DIAGNOSIS — Z1211 Encounter for screening for malignant neoplasm of colon: Secondary | ICD-10-CM

## 2022-03-27 DIAGNOSIS — I5022 Chronic systolic (congestive) heart failure: Secondary | ICD-10-CM

## 2022-03-27 LAB — LIPID PANEL
Cholesterol: 163 mg/dL (ref 0–200)
HDL: 29.2 mg/dL — ABNORMAL LOW (ref >39.00)
LDL Cholesterol: 116 mg/dL — ABNORMAL HIGH (ref 0–99)
NonHDL: 133.59
Total CHOL/HDL Ratio: 6
Triglycerides: 87 mg/dL (ref 0.0–149.0)
VLDL: 17.4 mg/dL (ref 0.0–40.0)

## 2022-03-27 LAB — COMPREHENSIVE METABOLIC PANEL
ALT: 30 U/L (ref 0–53)
AST: 16 U/L (ref 0–37)
Albumin: 3.9 g/dL (ref 3.5–5.2)
Alkaline Phosphatase: 57 U/L (ref 39–117)
BUN: 15 mg/dL (ref 6–23)
CO2: 31 mEq/L (ref 19–32)
Calcium: 8.9 mg/dL (ref 8.4–10.5)
Chloride: 103 mEq/L (ref 96–112)
Creatinine, Ser: 1.19 mg/dL (ref 0.40–1.50)
GFR: 72.84 mL/min (ref >60.00)
Glucose, Bld: 83 mg/dL (ref 70–99)
Potassium: 4.3 mEq/L (ref 3.5–5.1)
Sodium: 140 mEq/L (ref 135–145)
Total Bilirubin: 1 mg/dL (ref 0.2–1.2)
Total Protein: 7 g/dL (ref 6.0–8.3)

## 2022-03-27 LAB — CBC WITH DIFFERENTIAL/PLATELET
Basophils Absolute: 0 10*3/uL (ref 0.0–0.1)
Basophils Relative: 0.4 % (ref 0.0–3.0)
Eosinophils Absolute: 0.3 10*3/uL (ref 0.0–0.7)
Eosinophils Relative: 3.9 % (ref 0.0–5.0)
HCT: 41.3 % (ref 39.0–52.0)
Hemoglobin: 13.6 g/dL (ref 13.0–17.0)
Lymphocytes Relative: 35.8 % (ref 12.0–46.0)
Lymphs Abs: 2.9 10*3/uL (ref 0.7–4.0)
MCHC: 33.1 g/dL (ref 30.0–36.0)
MCV: 92.3 fl (ref 78.0–100.0)
Monocytes Absolute: 0.6 10*3/uL (ref 0.1–1.0)
Monocytes Relative: 6.9 % (ref 3.0–12.0)
Neutro Abs: 4.3 10*3/uL (ref 1.4–7.7)
Neutrophils Relative %: 53 % (ref 43.0–77.0)
Platelets: 271 10*3/uL (ref 150.0–400.0)
RBC: 4.47 Mil/uL (ref 4.22–5.81)
RDW: 13.9 % (ref 11.5–15.5)
WBC: 8.1 10*3/uL (ref 4.0–10.5)

## 2022-03-27 LAB — MICROALBUMIN / CREATININE URINE RATIO
Creatinine,U: 89.6 mg/dL
Microalb Creat Ratio: 0.8 mg/g (ref 0.0–30.0)
Microalb, Ur: <0.7 mg/dL (ref 0.0–1.9)

## 2022-03-27 MED ORDER — OZEMPIC (1 MG/DOSE) 2 MG/1.5ML ~~LOC~~ SOPN: 2.0000 mg | PEN_INJECTOR | SUBCUTANEOUS | 2 refills | Status: DC

## 2022-03-27 NOTE — Patient Instructions (Signed)
It was great seeing you today   We will follow up with you regarding your lab work   Please let me know if you need anything   

## 2022-03-27 NOTE — Progress Notes (Signed)
Subjective:    Patient ID: Vernon Reed, male    DOB: [birth year], 46 y.o.   MRN: [id number]  HPI Patient presents for yearly preventative medicine examination. He is a pleasant 46 year old male who  has a past medical history of AKI (acute kidney injury) ([place]) (07/27/2013), Cardiomyopathy ([place]) (07/25/2013), CHF (congestive heart failure) ([place]), Chicken pox, Chronic systolic heart failure ([place]) (01/04/2014), GERD (gastroesophageal reflux disease), Headache, Heart block (07/25/2013), Hyperlipemia, Hypertension, Hypokalemia (07/26/2013), Hypoxia (12/06/2014), Obesity, morbid ([place]) (12/27/2013), OSA (obstructive sleep apnea), Sleep apnea, SOB (shortness of breath) (07/23/2013), Tachy-brady syndrome ([place]) (07/26/2013), Tachycardia (07/23/2013), and Type II diabetes mellitus ([place]).  Diabetes mellitus type 2-he was maintained on Mounjaro 10 mg and was tolerating this medication well.  After his savings card expired and his insurance would not cover the medication.  3 months ago he was switched to Ozempic 1 mg and has tolerated this medication well. 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 He is not working out.   Lab Results  Component Value Date   HGBA1C 5.6 (A) 12/06/2021   Wt Readings from Last 3 Encounters:  03/27/22 (!) 311 lb (141.1 kg)  01/03/22 (!) 305 lb 12.8 oz (138.7 kg)  12/06/21 (!) 304 lb (137.9 kg)   HTN -managed with Coreg 12.5 mg - 1.5 tablets BID, Lasix 40 mg daily, hydralazine 50 mg 3 times daily, isosorbide 30 mg daily, metoprolol 100 mg twice daily, spirloactone 25 mg daily and Entresto 97-103 mg twice daily BP Readings from Last 3 Encounters:  03/27/22 120/70  01/03/22 126/70  12/06/21 120/86   History of CHF -managed by the heart failure clinic.  The EF is now normal.  He has moderate left ventricular hypertrophy.  He has grade 1 diastolic dysfunctions.  This is managed by cardiology with Coreg 12.5 mg - 1.5 tablets BID,  Lasix 40 mg daily, hydralazine 50 mg 3 times daily, isosorbide 30 mg daily, metoprolol 100 mg twice daily, spirloactone 25 mg daily and Entresto 97-103 mg twice daily  STD testing - denies symptoms but would like to be tested to make sure.   All immunizations and health maintenance protocols were reviewed with the patient and needed orders were placed.  Appropriate screening laboratory values were ordered for the patient including screening of hyperlipidemia, renal function and hepatic function. If indicated by BPH, a PSA was ordered.  Medication reconciliation,  past medical history, social history, problem list and allergies were reviewed in detail with the patient  Goals were established with regard to weight loss, exercise, and  diet in compliance with medications Wt Readings from Last 3 Encounters:  03/27/22 (!) 311 lb (141.1 kg)  01/03/22 (!) 305 lb 12.8 oz (138.7 kg)  12/06/21 (!) 304 lb (137.9 kg)   Review of Systems  Constitutional: Negative.   HENT: Negative.    Eyes: Negative.   Respiratory: Negative.    Cardiovascular: Negative.   Gastrointestinal: Negative.   Endocrine: Negative.   Genitourinary: Negative.   Musculoskeletal: Negative.   Skin: Negative.   Allergic/Immunologic: Negative.   Neurological: Negative.   Hematological: Negative.   Psychiatric/Behavioral: Negative.    All other systems reviewed and are negative.  Past Medical History:  Diagnosis Date   AKI (acute kidney injury) ([place]) 07/27/2013   Cardiomyopathy ([place]) 07/25/2013   CHF (congestive heart failure) ([place])    Chicken pox    Chronic systolic heart failure ([place]) 01/04/2014   GERD (  gastroesophageal reflux disease)    Headache    "maybe 5 times/yr; nothing regular" (12/06/2014)   Heart block 07/25/2013   Hyperlipemia    Hypertension    Hypokalemia 07/26/2013   Hypoxia 12/06/2014   Obesity, morbid ([place]) 12/27/2013   OSA (obstructive sleep apnea)    Sleep apnea    SOB (shortness of breath) 07/23/2013    Tachy-brady syndrome ([place]) 07/26/2013   Tachycardia 07/23/2013   Type II diabetes mellitus ([place])     Social History   Socioeconomic History   Marital status: Divorced    Spouse name: Not on file   Number of children: 3   Years of education: Not on file   Highest education level: Bachelor's degree (e.g., BA, AB, BS)  Occupational History   Occupation: Herbalist: [employer]  Tobacco Use   Smoking status: Never   Smokeless tobacco: Never  Vaping Use   Vaping Use: Never used  Substance and Sexual Activity   Alcohol use: No    Comment: 12/06/2014 "might have a beer a couple times/month"   Drug use: No   Sexual activity: Yes  Other Topics Concern   Not on file  Social History Narrative   Librarian, 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       Social Determinants of Health   Financial Resource Strain: Medium Risk (06/03/2021)   Overall Financial Resource Strain (CARDIA)    Difficulty of Paying Living Expenses: Somewhat hard  Food Insecurity: Food Insecurity Present (06/03/2021)   Hunger Vital Sign    Worried About Running Out of Food in the Last Year: Sometimes true    Ran Out of Food in the Last Year: Never true  Transportation Needs: No Transportation Needs (06/03/2021)   PRAPARE - Hydrologist (Medical): No    Lack of Transportation (Non-Medical): No  Physical Activity: Insufficiently Active (06/03/2021)   Exercise Vital Sign    Days of Exercise per Week: 2 days    Minutes of Exercise per Session: 30 min  Stress: No Stress Concern Present (06/03/2021)   [place]    Feeling of Stress : Only a little  Social Connections: Moderately Integrated (06/03/2021)   Social Connection and Isolation Panel [NHANES]    Frequency of Communication with Friends and Family: More than three times a week    Frequency of Social Gatherings with Friends and Family: Twice  a week    Attends Religious Services: More than 4 times per year    Active Member of [employer] or Organizations: Yes    Attends Music therapist: More than 4 times per year    Marital Status: Divorced  Human resources officer Violence: Not on file    Past Surgical History:  Procedure Laterality Date   ABDOMINAL SURGERY  ~ 1987   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   APPENDECTOMY  ~ [place]  07/2013   LEFT AND RIGHT HEART CATHETERIZATION WITH CORONARY ANGIOGRAM N/A 07/26/2013   Procedure: LEFT AND RIGHT HEART CATHETERIZATION WITH CORONARY ANGIOGRAM;  Surgeon: Leonie Man, MD;  Location: [facility] CATH LAB;  Service: Cardiovascular;  Laterality: N/A;    Family History  Problem Relation Age of Onset   Hypertension Mother    Hypertension Father    Sleep apnea Father     No Known Allergies  Current Outpatient Medications on File Prior to Visit  Medication Sig Dispense Refill  aspirin 81 MG EC tablet Take 1 tablet (81 mg total) by mouth daily. 30 tablet 6   Blood Glucose Monitoring Suppl (TRUE METRIX METER) W/DEVICE KIT 1 each by Does not apply route once as needed. 1 kit 0   carvedilol (COREG) 12.5 MG tablet TAKE 1.5 TABLETS BY MOUTH 2 (TWO) TIMES DAILY WITH A MEAL. LAST REFILL WITHOUT OFFICE VISIT 270 tablet 3   furosemide (LASIX) 40 MG tablet TAKE 1 TABLET BY MOUTH EVERY DAY AS NEEDED 90 tablet 3   glucose blood (TRUE METRIX BLOOD GLUCOSE TEST) test strip Used 3 times daily before meals. 100 each 12   hydrALAZINE (APRESOLINE) 50 MG tablet Take 1 tablet (50 mg total) by mouth 3 (three) times daily. 90 tablet 7   isosorbide mononitrate (IMDUR) 30 MG 24 hr tablet TAKE 1 TABLET BY MOUTH EVERY DAY 90 tablet 2   metoprolol tartrate (LOPRESSOR) 100 MG tablet Take 1 tablet by mouth two hours prior to scan 1 tablet 0   potassium chloride (KLOR-CON) 10 MEQ tablet TAKE 1 TABLET BY MOUTH EVERY DAY 90 tablet 1   sacubitril-valsartan (ENTRESTO) 97-103 MG Take 1 tablet by  mouth 2 (two) times daily. NEEDS FOLLOW UP APPOINTMENT FOR ANYMORE REFILLS 180 tablet 0   Semaglutide, 1 MG/DOSE, (OZEMPIC, 1 MG/DOSE,) 4 MG/3ML SOPN Inject 1 mg into the skin once a week. 3 mL 0   spironolactone (ALDACTONE) 25 MG tablet TAKE 1 TABLET BY MOUTH EVERY DAY 90 tablet 2   TRUEPLUS LANCETS 30G MISC 1 each by Does not apply route every morning. 50 each 11   No current facility-administered medications on file prior to visit.    BP 120/70   Pulse 85   Temp 98.4 F (36.9 C) (Oral)   Ht 5' 11.25" (1.81 m)   Wt (!) 311 lb (141.1 kg)   SpO2 94%   BMI 43.07 kg/m       Objective:   Physical Exam Vitals and nursing note reviewed.  Constitutional:      General: He is not in acute distress.    Appearance: Normal appearance. He is well-developed. He is obese.  HENT:     Head: Normocephalic and atraumatic.     Right Ear: Tympanic membrane, ear canal and external ear normal. There is no impacted cerumen.     Left Ear: Tympanic membrane, ear canal and external ear normal. There is no impacted cerumen.     Nose: Nose normal. No congestion or rhinorrhea.     Mouth/Throat:     Mouth: Mucous membranes are moist.     Pharynx: Oropharynx is clear. No oropharyngeal exudate or posterior oropharyngeal erythema.  Eyes:     General:        Right eye: No discharge.        Left eye: No discharge.     Extraocular Movements: Extraocular movements intact.     Conjunctiva/sclera: Conjunctivae normal.     Pupils: Pupils are equal, round, and reactive to light.  Neck:     Vascular: No carotid bruit.     Trachea: No tracheal deviation.  Cardiovascular:     Rate and Rhythm: Normal rate and regular rhythm.     Pulses: Normal pulses.     Heart sounds: Normal heart sounds. No murmur heard.    No friction rub. No gallop.  Pulmonary:     Effort: Pulmonary effort is normal. No respiratory distress.     Breath sounds: Normal breath sounds. No stridor. No wheezing, rhonchi or  rales.  Chest:      Chest wall: No tenderness.  Abdominal:     General: Bowel sounds are normal. There is no distension.     Palpations: Abdomen is soft. There is no mass.     Tenderness: There is no abdominal tenderness. There is no right CVA tenderness, left CVA tenderness, guarding or rebound.     Hernia: No hernia is present.  Musculoskeletal:        General: No swelling, tenderness, deformity or signs of injury. Normal range of motion.     Right lower leg: No edema.     Left lower leg: No edema.  Lymphadenopathy:     Cervical: No cervical adenopathy.  Skin:    General: Skin is warm and dry.     Capillary Refill: Capillary refill takes less than 2 seconds.     Coloration: Skin is not jaundiced or pale.     Findings: No bruising, erythema, lesion or rash.  Neurological:     General: No focal deficit present.     Mental Status: He is alert and oriented to person, place, and time.     Cranial Nerves: No cranial nerve deficit.     Sensory: No sensory deficit.     Motor: No weakness.     Coordination: Coordination normal.     Gait: Gait normal.     Deep Tendon Reflexes: Reflexes normal.  Psychiatric:        Mood and Affect: Mood normal.        Behavior: Behavior normal.        Thought Content: Thought content normal.        Judgment: Judgment normal.       Assessment & Plan:  1. Routine general medical examination at a health care facility - Needs weight loss - Follow up in one year or sooner if needed  CBC with Differential/Platelet; Future - Comprehensive metabolic panel; Future - Hemoglobin A1c; Future - Lipid panel; Future - TSH; Future - PSA; Future - Microalbumin/Creatinine Ratio, Urine; Future  2. Type 2 diabetes mellitus with other specified complication, unspecified whether long term insulin use (HCC) - Will increase Ozempic to 2 mg dosing - Follow up in three months  - CBC with Differential/Platelet; Future - Comprehensive metabolic panel; Future - Hemoglobin A1c; Future -  Lipid panel; Future - TSH; Future - PSA; Future - Microalbumin/Creatinine Ratio, Urine; Future - Semaglutide, 1 MG/DOSE, (OZEMPIC, 1 MG/DOSE,) 2 MG/1.5ML SOPN; Inject 2 mg into the skin once a week.  Dispense: 6 mL; Refill: 2  3. Essential hypertension - Well controlled.  - No change in medications.  - CBC with Differential/Platelet; Future - Comprehensive metabolic panel; Future - Hemoglobin A1c; Future - Lipid panel; Future - TSH; Future - PSA; Future - Microalbumin/Creatinine Ratio, Urine; Future  4. Obesity, morbid ([place]) - Needs to clean up diet and start exercising  - CBC with Differential/Platelet; Future - Comprehensive metabolic panel; Future - Hemoglobin A1c; Future - Lipid panel; Future - TSH; Future - PSA; Future - Microalbumin/Creatinine Ratio, Urine; Future  5. Chronic systolic heart failure ([place]) - Per cardiology  - CBC with Differential/Platelet; Future - Comprehensive metabolic panel; Future - Hemoglobin A1c; Future - Lipid panel; Future - TSH; Future - PSA; Future - Microalbumin/Creatinine Ratio, Urine; Future  6. Colon cancer screening  - Ambulatory referral to Gastroenterology  7. Screening examination for STD (sexually transmitted disease)  - Urine cytology ancillary only - HIV Antibody (routine testing w rflx)  Dorothyann Peng,  NP

## 2022-03-28 LAB — PSA: PSA: 0.12 ng/mL (ref 0.10–4.00)

## 2022-03-28 LAB — HIV ANTIBODY (ROUTINE TESTING W REFLEX): HIV 1&2 Ab, 4th Generation: NONREACTIVE

## 2022-03-28 LAB — TSH: TSH: 2.21 u[IU]/mL (ref 0.35–5.50)

## 2022-03-31 LAB — URINE CYTOLOGY ANCILLARY ONLY
Candida Urine: NEGATIVE
Chlamydia: NEGATIVE
Comment: NEGATIVE
Comment: NEGATIVE
Comment: NORMAL
Neisseria Gonorrhea: NEGATIVE
Trichomonas: NEGATIVE

## 2022-03-31 LAB — HEMOGLOBIN A1C: Hgb A1c MFr Bld: 6.2 % (ref 4.6–6.5)

## 2022-04-10 ENCOUNTER — Other Ambulatory Visit: Payer: Self-pay | Admitting: Cardiovascular Disease

## 2022-04-10 DIAGNOSIS — I509 Heart failure, unspecified: Secondary | ICD-10-CM

## 2022-04-10 DIAGNOSIS — E1165 Type 2 diabetes mellitus with hyperglycemia: Secondary | ICD-10-CM

## 2022-04-11 ENCOUNTER — Other Ambulatory Visit (HOSPITAL_COMMUNITY): Payer: Self-pay | Admitting: Cardiology

## 2022-04-14 ENCOUNTER — Telehealth: Payer: Self-pay | Admitting: Adult Health

## 2022-04-14 NOTE — Telephone Encounter (Signed)
Pt is calling and can not find ozempic 2 mg and would like to have samples

## 2022-04-14 NOTE — Telephone Encounter (Signed)
Pt has found ozempic 1 mg at  [facility], [place] - [place] Phone:  [phone]  Fax:  [phone]

## 2022-04-15 ENCOUNTER — Other Ambulatory Visit: Payer: Self-pay | Admitting: Adult Health

## 2022-04-15 DIAGNOSIS — E1169 Type 2 diabetes mellitus with other specified complication: Secondary | ICD-10-CM

## 2022-04-15 MED ORDER — OZEMPIC (1 MG/DOSE) 2 MG/1.5ML ~~LOC~~ SOPN: 1.0000 mg | PEN_INJECTOR | SUBCUTANEOUS | 2 refills | Status: AC

## 2022-04-16 ENCOUNTER — Other Ambulatory Visit: Payer: Self-pay | Admitting: Adult Health

## 2022-04-16 MED ORDER — SEMAGLUTIDE (1 MG/DOSE) 4 MG/3ML ~~LOC~~ SOPN: 1.0000 mg | PEN_INJECTOR | SUBCUTANEOUS | 0 refills | Status: DC

## 2022-04-16 NOTE — Telephone Encounter (Signed)
Checked there are not samples of the Ozempic 2 mg in office.

## 2022-04-18 ENCOUNTER — Encounter: Payer: BC Managed Care – PPO | Admitting: Adult Health

## 2022-06-10 ENCOUNTER — Other Ambulatory Visit: Payer: Self-pay

## 2022-06-10 ENCOUNTER — Telehealth: Payer: Self-pay | Admitting: Adult Health

## 2022-06-10 ENCOUNTER — Encounter: Payer: Self-pay | Admitting: Adult Health

## 2022-06-10 MED ORDER — SEMAGLUTIDE(0.25 OR 0.5MG/DOS) 2 MG/3ML ~~LOC~~ SOPN: PEN_INJECTOR | SUBCUTANEOUS | 1 refills | Status: DC

## 2022-06-10 NOTE — Telephone Encounter (Signed)
Rx sent to pharmacy   

## 2022-06-10 NOTE — Telephone Encounter (Signed)
Pt called to say he finally found a pharmacy that has Ozempic, but they have the 2 mg.   Pt is asking that NP send a new Rx to:  [facility] [address] ([address]), [place] - [address] Phone: [phone]  Fax: [phone]     As soon as possible.

## 2022-06-11 NOTE — Telephone Encounter (Signed)
Patient notified of update  and verbalized understanding. Pt stated that he received the Rx yesterday. No further actions needed!

## 2022-06-15 ENCOUNTER — Encounter: Payer: Self-pay | Admitting: Cardiovascular Disease

## 2022-06-15 NOTE — Progress Notes (Unsigned)
Cardiology Office Note:    Date:  06/16/2022   ID:  Vernon Reed, DOB 15-Feb-1976, MRN [id number]  PCP:  Dorothyann Peng, NP  Cardiologist:  Wyvonne Lenz, previous CHF clinic  Electrophysiologist:  None   Referring MD: Dorothyann Peng, NP   Chief Complaint  Patient presents with   Congestive Heart Failure             April 14, 2019    Vernon Reed is a 46 y.o. male with a hx of chronic systolic congestive heart failure.  He has been seen by the heart failure clinic for the past several years and his ejection fraction is now normal.  He has moderate left ventricular hypertrophy.  He has grade 1 diastolic dysfunction.  Takes his meds,  Tries to avoid salt .  No CP  Able to do his normal activties  has been getting some exercise  Knows that he needs to lose some weight .  Has OSA , he wears a CPAP mask. Heart cath in 2015 revealed normal coronary arteries.  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 He has a hx of obesity .   Wt today is 307 lbs.  Has not lost any weight .    Busy with the band  Sings with [employer] for past several years  Still with [employer] also Is not workint out.    Eats unhealthy foods frequently  - after playing a jig, only fast foods are available  We discussed the fact that as he lost weight we could gradually cut back on some of the medications.  He needs to work on weight loss and improvement of his diet before we start cutting back on his medications.  May 21 2021:   Vernon Reed is seen today for follow-up visit for his congestive heart failure.  He has been eating lots of salty foods.  He plays in a band and does not always eat the healthiest food. Wt is 305 lbs.  Works at [employer] in a band  ([employer] ) - vocals, [place],  Very busy  Playing with the [employer]ters on Dec. 10 in [place] No CP or dyspnea.  .   Getting some exercise  On coreg, entresto, hydralazine  Takes the lasix PRN, - perhaps QOD   June 30 , 2023  Vernon Reed is seen   Has been having some fluttering in his chest  Chest tightness  May have been stress.  Wt is 305  Diet has been poor  Vague chest discomfort. Fatigued Not so much shortness of breath    Dec. 11 , 2023  Vernon Reed is seen for follow up of his chronic diastolic CHF, HTN, obestiy Wt is 314 ( up 9 lbs )   Has not been paying attention to his diet Is now on Ozympic   No CP , no dyspnea    Past Medical History:  Diagnosis Date   AKI (acute kidney injury) ([place]) 07/27/2013   Cardiomyopathy ([place]) 07/25/2013   CHF (congestive heart failure) ([place])    Chicken pox    Chronic systolic heart failure ([place]) 01/04/2014   GERD (gastroesophageal reflux disease)    Headache    "maybe 5 times/yr; nothing regular" (12/06/2014)   Heart block 07/25/2013   Hyperlipemia    Hypertension    Hypokalemia 07/26/2013   Hypoxia 12/06/2014   Obesity,  morbid ([place]) 12/27/2013   OSA (obstructive sleep apnea)    Sleep apnea    SOB (shortness of breath) 07/23/2013   Tachy-brady syndrome ([place]) 07/26/2013   Tachycardia 07/23/2013   Type II diabetes mellitus ([place])     Past Surgical History:  Procedure Laterality Date   ABDOMINAL SURGERY  ~ 1987   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   APPENDECTOMY  ~ [place]  07/2013   LEFT AND RIGHT HEART CATHETERIZATION WITH CORONARY ANGIOGRAM N/A 07/26/2013   Procedure: LEFT AND RIGHT HEART CATHETERIZATION WITH CORONARY ANGIOGRAM;  Surgeon: Leonie Man, MD;  Location: [facility] CATH LAB;  Service: Cardiovascular;  Laterality: N/A;    Current Medications: Current Meds  Medication Sig   aspirin 81 MG EC tablet Take 1 tablet (81 mg total) by mouth daily.   Blood Glucose Monitoring Suppl (TRUE METRIX METER) W/DEVICE KIT 1 each by Does not apply route once as needed.   carvedilol (COREG) 12.5 MG tablet TAKE 1.5 TABLETS BY MOUTH 2 (TWO)  TIMES DAILY WITH A MEAL. LAST REFILL WITHOUT OFFICE VISIT   ENTRESTO 97-103 MG TAKE 1 TABLET BY MOUTH 2 (TWO) TIMES DAILY. NEEDS FOLLOW UP APPOINTMENT FOR ANYMORE REFILLS   furosemide (LASIX) 40 MG tablet TAKE 1 TABLET BY MOUTH EVERY DAY AS NEEDED   glucose blood (TRUE METRIX BLOOD GLUCOSE TEST) test strip Used 3 times daily before meals.   hydrALAZINE (APRESOLINE) 50 MG tablet Take 1 tablet (50 mg total) by mouth 3 (three) times daily.   isosorbide mononitrate (IMDUR) 30 MG 24 hr tablet TAKE 1 TABLET BY MOUTH EVERY DAY   potassium chloride (KLOR-CON) 10 MEQ tablet TAKE 1 TABLET BY MOUTH EVERY DAY   Semaglutide,0.25 or 0.5MG/DOS, 2 MG/3ML SOPN Inject 2 mg into the skin once a week   spironolactone (ALDACTONE) 25 MG tablet TAKE 1 TABLET BY MOUTH EVERY DAY   TRUEPLUS LANCETS 30G MISC 1 each by Does not apply route every morning.   [DISCONTINUED] metoprolol tartrate (LOPRESSOR) 100 MG tablet Take 1 tablet by mouth two hours prior to scan     Allergies:   Patient has no known allergies.   Social History   Socioeconomic History   Marital status: Divorced    Spouse name: Not on file   Number of children: 3   Years of education: Not on file   Highest education level: Bachelor's degree (e.g., BA, AB, BS)  Occupational History   Occupation: Herbalist: [employer]  Tobacco Use   Smoking status: Never   Smokeless tobacco: Never  Vaping Use   Vaping Use: Never used  Substance and Sexual Activity   Alcohol use: No    Comment: 12/06/2014 "might have a beer a couple times/month"   Drug use: No   Sexual activity: Yes  Other Topics Concern   Not on file  Social History Narrative   Librarian, 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       Social Determinants of Health   Financial Resource Strain: Medium Risk (06/03/2021)   Overall Financial Resource Strain (CARDIA)    Difficulty of Paying Living Expenses: Somewhat hard  Food Insecurity: Food Insecurity Present  (06/03/2021)   Hunger Vital Sign    Worried About Running Out of Food in the Last Year: Sometimes true    Ran Out of Food in the Last Year: Never true  Transportation Needs: No Transportation Needs (06/03/2021)   PRAPARE - Transportation  Lack of Transportation (Medical): No    Lack of Transportation (Non-Medical): No  Physical Activity: Insufficiently Active (06/03/2021)   Exercise Vital Sign    Days of Exercise per Week: 2 days    Minutes of Exercise per Session: 30 min  Stress: No Stress Concern Present (06/03/2021)   [place]    Feeling of Stress : Only a little  Social Connections: Moderately Integrated (06/03/2021)   Social Connection and Isolation Panel [NHANES]    Frequency of Communication with Friends and Family: More than three times a week    Frequency of Social Gatherings with Friends and Family: Twice a week    Attends Religious Services: More than 4 times per year    Active Member of [employer] or Organizations: Yes    Attends Music therapist: More than 4 times per year    Marital Status: Divorced     Family History: The patient's family history includes Hypertension in his father and mother; Sleep apnea in his father.  ROS:   Please see the history of present illness.     All other systems reviewed and are negative.  EKGs/Labs/Other Studies Reviewed:    The following studies were reviewed today:   EKG:   NSR at 93.   .  Early repol .  , no changes from previous   Recent Labs: 03/27/2022: ALT 30; BUN 15; Creatinine, Ser 1.19; Hemoglobin 13.6; Platelets 271.0; Potassium 4.3; Sodium 140; TSH 2.21  Recent Lipid Panel    Component Value Date/Time   CHOL 163 03/27/2022 1326   TRIG 87.0 03/27/2022 1326   HDL 29.20 (L) 03/27/2022 1326   CHOLHDL 6 03/27/2022 1326   VLDL 17.4 03/27/2022 1326   LDLCALC 116 (H) 03/27/2022 1326   LDLCALC 111 (H) 02/02/2020 0836    Physical Exam:     Physical Exam: Blood pressure 118/80, pulse 93, height 6' (1.829 m), weight (!) 314 lb 3.2 oz (142.5 kg), SpO2 98 %.       GEN:  Well nourished, well developed in no acute distress HEENT: Normal NECK: No JVD; No carotid bruits LYMPHATICS: No lymphadenopathy CARDIAC: RRR , no murmurs, rubs, gallops RESPIRATORY:  Clear to auscultation without rales, wheezing or rhonchi  ABDOMEN: Soft, non-tender, non-distended MUSCULOSKELETAL:  No edema; No deformity  SKIN: Warm and dry NEUROLOGIC:  Alert and oriented x 3      ASSESSMENT:    1. Primary hypertension   2. Mixed hyperlipidemia   3. Obesity due to excess calories, unspecified classification, unspecified whether serious comorbidity present      PLAN:       1.  History of chronic systolic congestive heart failure:   Pt is stable  Cont Entresto 97-103 twice daily, hydralazine 50 mg 3 times a day, isosorbide 30 mg a day, Lasix 40 mg a day, potassium chloride 10 mill equivalents a day, carvedilol 12.5 BID , spironolactone 25 mg a day  Cont meds    2.  Chest pain: He has lots of various aches and pains especially in his chest.  We will order a coronary CT angiogram for further evaluation.        Medication Adjustments/Labs and Tests Ordered: Current medicines are reviewed at length with the patient today.  Concerns regarding medicines are outlined above.  Orders Placed This Encounter  Procedures   EKG 12-Lead    No orders of the defined types were placed in this encounter.  Patient Instructions  Adopting a Healthy Lifestyle.   Weight: Know what a healthy weight is for you (roughly BMI <25) and aim to maintain this. You can calculate your body mass index on your smart phone  Diet: Aim for 7+ servings of fruits and vegetables daily Limit animal fats in diet for cholesterol and heart health - choose grass fed whenever available Avoid highly processed foods (fast food burgers, tacos, fried chicken, pizza, hot  dogs, french fries)  Saturated fat comes in the form of butter, lard, coconut oil, margarine, partially hydrogenated oils, and fat in meat. These increase your risk of cardiovascular disease.  Use healthy plant oils, such as olive, canola, soy, corn, sunflower and peanut.  Whole foods such as fruits, vegetables and whole grains have fiber  Men need > 38 grams of fiber per day Women need > 25 grams of fiber per day  Load up on vegetables and fruits - one-half of your plate: Aim for color and variety, and remember that potatoes dont count. Go for whole grains - one-quarter of your plate: Whole wheat, barley, wheat berries, quinoa, oats, brown rice, and foods made with them. If you want pasta, go with whole wheat pasta. Protein power - one-quarter of your plate: Fish, chicken, beans, and nuts are all healthy, versatile protein sources. Limit red meat. You need carbohydrates for energy! The type of carbohydrate is more important than the amount. Choose carbohydrates such as vegetables, fruits, whole grains, beans, and nuts in the place of white rice, white pasta, potatoes (baked or fried), macaroni and cheese, cakes, cookies, and donuts.  If youre thirsty, drink water. Coffee and tea are good in moderation, but skip sugary drinks and limit milk and dairy products to one or two daily servings. Keep sugar intake at 6 teaspoons or 24 grams or LESS       Exercise: Aim for 150 min of moderate intensity exercise weekly for heart health, and weights twice weekly for bone health Stay active - any steps are better than no steps! Aim for 7-9 hours of sleep daily   Medication Instructions:  Your physician recommends that you continue on your current medications as directed. Please refer to the Current Medication list given to you today.  *If you need a refill on your cardiac medications before your next appointment, please call your pharmacy*   Lab Work: NONE If you have labs (blood work) drawn today  and your tests are completely normal, you will receive your results only by: [place] (if you have MyChart) OR A paper copy in the mail If you have any lab test that is abnormal or we need to change your treatment, we will call you to review the results.   Testing/Procedures: NONE   Follow-Up: At [facility], you and your health needs are our priority.  As part of our continuing mission to provide you with exceptional heart care, we have created designated Provider Care Teams.  These Care Teams include your primary Cardiologist (physician) and Advanced Practice Providers (APPs -  Physician Assistants and Nurse Practitioners) who all work together to provide you with the care you need, when you need it.  We recommend signing up for the patient portal called "MyChart".  Sign up information is provided on this After Visit Summary.  MyChart is used to connect with patients for Virtual Visits (Telemedicine).  Patients are able to view lab/test results, encounter notes, upcoming appointments, etc.  Non-urgent messages can be sent to your provider as  well.   To learn more about what you can do with MyChart, go to [email or web address].    Your next appointment:   1 year(s)  The format for your next appointment:   In Person  Provider:   Mertie Moores, MD       Important Information About Sugar            Signed, Mertie Moores, MD  06/16/2022 6:10 PM    [facility]  Has DM - HBA1C was 7.6

## 2022-06-16 ENCOUNTER — Encounter: Payer: Self-pay | Admitting: Cardiovascular Disease

## 2022-06-16 ENCOUNTER — Ambulatory Visit: Payer: BC Managed Care – PPO | Attending: Cardiovascular Disease | Admitting: Cardiovascular Disease

## 2022-06-16 VITALS — BP 118/80 | HR 93 | Ht 72.0 in | Wt 314.2 lb

## 2022-06-16 DIAGNOSIS — E6609 Other obesity due to excess calories: Secondary | ICD-10-CM

## 2022-06-16 DIAGNOSIS — E782 Mixed hyperlipidemia: Secondary | ICD-10-CM | POA: Diagnosis not present

## 2022-06-16 DIAGNOSIS — I10 Essential (primary) hypertension: Secondary | ICD-10-CM | POA: Diagnosis not present

## 2022-06-16 NOTE — Patient Instructions (Addendum)
Adopting a Healthy Lifestyle.   Weight: Know what a healthy weight is for you (roughly BMI <25) and aim to maintain this. You can calculate your body mass index on your smart phone  Diet: Aim for 7+ servings of fruits and vegetables daily Limit animal fats in diet for cholesterol and heart health - choose grass fed whenever available Avoid highly processed foods (fast food burgers, tacos, fried chicken, pizza, hot dogs, french fries)  Saturated fat comes in the form of butter, lard, coconut oil, margarine, partially hydrogenated oils, and fat in meat. These increase your risk of cardiovascular disease.  Use healthy plant oils, such as olive, canola, soy, corn, sunflower and peanut.  Whole foods such as fruits, vegetables and whole grains have fiber  Men need > 38 grams of fiber per day Women need > 25 grams of fiber per day  Load up on vegetables and fruits - one-half of your plate: Aim for color and variety, and remember that potatoes dont count. Go for whole grains - one-quarter of your plate: Whole wheat, barley, wheat berries, quinoa, oats, brown rice, and foods made with them. If you want pasta, go with whole wheat pasta. Protein power - one-quarter of your plate: Fish, chicken, beans, and nuts are all healthy, versatile protein sources. Limit red meat. You need carbohydrates for energy! The type of carbohydrate is more important than the amount. Choose carbohydrates such as vegetables, fruits, whole grains, beans, and nuts in the place of white rice, white pasta, potatoes (baked or fried), macaroni and cheese, cakes, cookies, and donuts.  If youre thirsty, drink water. Coffee and tea are good in moderation, but skip sugary drinks and limit milk and dairy products to one or two daily servings. Keep sugar intake at 6 teaspoons or 24 grams or LESS       Exercise: Aim for 150 min of moderate intensity exercise weekly for heart health, and weights twice weekly for bone health Stay active -  any steps are better than no steps! Aim for 7-9 hours of sleep daily   Medication Instructions:  Your physician recommends that you continue on your current medications as directed. Please refer to the Current Medication list given to you today.  *If you need a refill on your cardiac medications before your next appointment, please call your pharmacy*   Lab Work: NONE If you have labs (blood work) drawn today and your tests are completely normal, you will receive your results only by: MyChart Message (if you have MyChart) OR A paper copy in the mail If you have any lab test that is abnormal or we need to change your treatment, we will call you to review the results.   Testing/Procedures: NONE   Follow-Up: At [facility], you and your health needs are our priority.  As part of our continuing mission to provide you with exceptional heart care, we have created designated Provider Care Teams.  These Care Teams include your primary Cardiologist (physician) and Advanced Practice Providers (APPs -  Physician Assistants and Nurse Practitioners) who all work together to provide you with the care you need, when you need it.  We recommend signing up for the patient portal called "MyChart".  Sign up information is provided on this After Visit Summary.  MyChart is used to connect with patients for Virtual Visits (Telemedicine).  Patients are able to view lab/test results, encounter notes, upcoming appointments, etc.  Non-urgent messages can be sent to your provider as well.   To   learn more about what you can do with MyChart, go to [email or web address].    Your next appointment:   1 year(s)  The format for your next appointment:   In Person  Provider:   Philip Nahser, MD       Important Information About Sugar           

## 2022-06-25 ENCOUNTER — Other Ambulatory Visit (HOSPITAL_COMMUNITY): Payer: Self-pay

## 2022-06-25 ENCOUNTER — Telehealth (HOSPITAL_COMMUNITY): Payer: Self-pay

## 2022-06-26 ENCOUNTER — Other Ambulatory Visit (HOSPITAL_COMMUNITY): Payer: Self-pay

## 2022-06-26 NOTE — Telephone Encounter (Signed)
Advanced Heart Failure Patient Advocate Encounter  Prior authorization is required for Entresto. PA submitted and APPROVED on 06/25/22. Determination letter has been added to patient chart.  Key [id number] Effective: 06/25/22 - 06/26/23  Burnell Blanks, CPhT Rx Patient Advocate Phone: [phone]

## 2022-09-18 ENCOUNTER — Other Ambulatory Visit: Payer: Self-pay | Admitting: Adult Health

## 2022-09-18 ENCOUNTER — Other Ambulatory Visit: Payer: Self-pay | Admitting: Cardiovascular Disease

## 2022-09-18 DIAGNOSIS — I10 Essential (primary) hypertension: Secondary | ICD-10-CM

## 2022-09-19 ENCOUNTER — Other Ambulatory Visit: Payer: Self-pay | Admitting: Cardiovascular Disease

## 2022-10-30 ENCOUNTER — Other Ambulatory Visit (HOSPITAL_COMMUNITY): Payer: Self-pay | Admitting: Cardiology

## 2022-11-21 ENCOUNTER — Other Ambulatory Visit: Payer: Self-pay | Admitting: Family

## 2022-11-21 MED ORDER — PREDNISONE 20 MG PO TABS: 40.0000 mg | ORAL_TABLET | Freq: Every day | ORAL | 0 refills | Status: DC

## 2022-11-24 ENCOUNTER — Other Ambulatory Visit: Payer: Self-pay | Admitting: *Deleted

## 2022-11-24 MED ORDER — ENTRESTO 97-103 MG PO TABS: 1.0000 | ORAL_TABLET | Freq: Two times a day (BID) | ORAL | 2 refills | Status: DC

## 2022-12-15 ENCOUNTER — Other Ambulatory Visit: Payer: Self-pay | Admitting: Cardiovascular Disease

## 2023-01-08 ENCOUNTER — Ambulatory Visit
Admission: EM | Admit: 2023-01-08 | Discharge: 2023-01-08 | Disposition: A | Discharge status: Home / Self Care | Payer: BC Managed Care – PPO | Attending: Internal Medicine | Admitting: Internal Medicine

## 2023-01-08 ENCOUNTER — Encounter: Payer: Self-pay | Admitting: *Deleted

## 2023-01-08 DIAGNOSIS — Z113 Encounter for screening for infections with a predominantly sexual mode of transmission: Secondary | ICD-10-CM

## 2023-01-08 DIAGNOSIS — R350 Frequency of micturition: Secondary | ICD-10-CM | POA: Diagnosis not present

## 2023-01-08 DIAGNOSIS — R300 Dysuria: Secondary | ICD-10-CM

## 2023-01-08 LAB — POCT URINALYSIS DIP (MANUAL ENTRY)
Bilirubin, UA: NEGATIVE
Blood, UA: NEGATIVE
Glucose, UA: NEGATIVE mg/dL
Ketones, POC UA: NEGATIVE mg/dL
Leukocytes, UA: NEGATIVE
Nitrite, UA: NEGATIVE
Protein Ur, POC: NEGATIVE mg/dL
Spec Grav, UA: 1.01 (ref 1.010–1.025)
Urobilinogen, UA: 0.2 E.U./dL
pH, UA: 5.5 (ref 5.0–8.0)

## 2023-01-08 NOTE — ED Provider Notes (Signed)
[facility]    CSN: [id number] Arrival date & time: 01/08/23  1352      History   Chief Complaint Chief Complaint  Patient presents with   Polyuria   Abdominal Pain    HPI Vernon Reed is a 47 y.o. male.   Patient presents with urinary frequency, urinary burning, suprapubic abdominal pain that is mild and has been present for about 4 to 5 days.  Patient denies penile discharge, testicular pain or swelling, back pain, fever.  Patient reports that he has had recent unprotected intercourse with a male sexual partner but denies any exposure to STD.  Denies history of frequent urinary tract infections.  Patient does have diabetes but reports his blood sugars have been in the 80s recently.   Abdominal Pain   Past Medical History:  Diagnosis Date   AKI (acute kidney injury) (HCC) 07/27/2013   Cardiomyopathy (HCC) 07/25/2013   CHF (congestive heart failure) (HCC)    Chicken pox    Chronic systolic heart failure (HCC) 01/04/2014   GERD (gastroesophageal reflux disease)    Headache    "maybe 5 times/yr; nothing regular" (12/06/2014)   Heart block 07/25/2013   Hyperlipemia    Hypertension    Hypokalemia 07/26/2013   Hypoxia 12/06/2014   Obesity, morbid (HCC) 12/27/2013   OSA (obstructive sleep apnea)    Sleep apnea    SOB (shortness of breath) 07/23/2013   Tachy-brady syndrome (HCC) 07/26/2013   Tachycardia 07/23/2013   Type II diabetes mellitus (HCC)     Patient Active Problem List   Diagnosis Date Noted   Type II diabetes mellitus (HCC) 12/11/2014   Hypoxia 12/06/2014   OSA (obstructive sleep apnea) 01/10/2014   Chronic systolic heart failure (HCC) 01/04/2014   Obesity, morbid (HCC) 12/27/2013   Tachy-brady syndrome (HCC) 07/26/2013   Cardiomyopathy (HCC) 07/25/2013   Heart block 07/25/2013   Hypertension 07/23/2013    Past Surgical History:  Procedure Laterality Date   ABDOMINAL SURGERY  ~ 1987   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   APPENDECTOMY  ~  1989   CARDIAC CATHETERIZATION  07/2013   LEFT AND RIGHT HEART CATHETERIZATION WITH CORONARY ANGIOGRAM N/A 07/26/2013   Procedure: LEFT AND RIGHT HEART CATHETERIZATION WITH CORONARY ANGIOGRAM;  Surgeon: Marykay Lex, MD;  Location: [facility] CATH LAB;  Service: Cardiovascular;  Laterality: N/A;       Home Medications    Prior to Admission medications   Medication Sig Start Date End Date Taking? Authorizing Provider  aspirin 81 MG EC tablet Take 1 tablet (81 mg total) by mouth daily. 07/16/15  Yes Laurey Morale, MD  carvedilol (COREG) 12.5 MG tablet Take 1.5 tablets (18.75 mg total) by mouth 2 (two) times daily with a meal. 09/19/22  Yes Nahser, Deloris Ping, MD  furosemide (LASIX) 40 MG tablet TAKE 1 TABLET BY MOUTH EVERY DAY AS NEEDED 09/18/22  Yes Nahser, Deloris Ping, MD  hydrALAZINE (APRESOLINE) 50 MG tablet TAKE 1 TABLET BY MOUTH THREE TIMES A DAY 12/15/22  Yes Nahser, Deloris Ping, MD  isosorbide mononitrate (IMDUR) 30 MG 24 hr tablet TAKE 1 TABLET BY MOUTH EVERY DAY 02/24/22  Yes Nahser, Deloris Ping, MD  potassium chloride (KLOR-CON) 10 MEQ tablet TAKE 1 TABLET BY MOUTH EVERY DAY 09/18/22  Yes Nafziger, Kandee Keen, NP  sacubitril-valsartan (ENTRESTO) 97-103 MG Take 1 tablet by mouth 2 (two) times daily. 11/24/22  Yes Nahser, Deloris Ping, MD  Semaglutide,0.25 or 0.5MG /DOS, 2 MG/3ML SOPN Inject 2 mg into the skin  once a week 06/10/22  Yes Nafziger, Kandee Keen, NP  spironolactone (ALDACTONE) 25 MG tablet TAKE 1 TABLET BY MOUTH EVERY DAY 04/10/22  Yes Nahser, Deloris Ping, MD  Blood Glucose Monitoring Suppl (TRUE METRIX METER) W/DEVICE KIT 1 each by Does not apply route once as needed. 12/11/14   Hoy Register, MD  glucose blood (TRUE METRIX BLOOD GLUCOSE TEST) test strip Used 3 times daily before meals. 04/08/16   Hoy Register, MD  predniSONE (DELTASONE) 20 MG tablet Take 2 tablets (40 mg total) by mouth daily with breakfast. 11/21/22   Worthy Rancher B, FNP  TRUEPLUS LANCETS 30G MISC 1 each by Does not apply route every morning.  12/11/14   Hoy Register, MD    Family History Family History  Problem Relation Age of Onset   Hypertension Mother    Hypertension Father    Sleep apnea Father     Social History Social History   Tobacco Use   Smoking status: Never   Smokeless tobacco: Never  Vaping Use   Vaping Use: Never used  Substance Use Topics   Alcohol use: No    Comment: rare   Drug use: No     Allergies   Patient has no known allergies.   Review of Systems Review of Systems Per HPI  Physical Exam Triage Vital Signs ED Triage Vitals  Enc Vitals Group     BP 01/08/23 1400 126/82     Pulse Rate 01/08/23 1400 87     Resp 01/08/23 1400 16     Temp 01/08/23 1400 98 F (36.7 C)     Temp Source 01/08/23 1400 Oral     SpO2 01/08/23 1400 93 %     Weight --      Height --      Head Circumference --      Peak Flow --      Pain Score 01/08/23 1401 6     Pain Loc --      Pain Edu? --      Excl. in GC? --    No data found.  Updated Vital Signs BP 126/82   Pulse 87   Temp 98 F (36.7 C) (Oral)   Resp 16   SpO2 95%   Visual Acuity Right Eye Distance:   Left Eye Distance:   Bilateral Distance:    Right Eye Near:   Left Eye Near:    Bilateral Near:     Physical Exam Constitutional:      General: He is not in acute distress.    Appearance: Normal appearance. He is not toxic-appearing or diaphoretic.  HENT:     Head: Normocephalic and atraumatic.  Eyes:     Extraocular Movements: Extraocular movements intact.     Conjunctiva/sclera: Conjunctivae normal.  Pulmonary:     Effort: Pulmonary effort is normal.  Genitourinary:    Comments: Deferred with shared decision making.  Self swab performed. Neurological:     General: No focal deficit present.     Mental Status: He is alert and oriented to person, place, and time. Mental status is at baseline.  Psychiatric:        Mood and Affect: Mood normal.        Behavior: Behavior normal.        Thought Content: Thought content  normal.        Judgment: Judgment normal.      UC Treatments / Results  Labs (all labs ordered are listed, but only abnormal results are  displayed) Labs Reviewed  POCT URINALYSIS DIP (MANUAL ENTRY)  CYTOLOGY, (ORAL, ANAL, URETHRAL) ANCILLARY ONLY    EKG   Radiology No results found.  Procedures Procedures (including critical care time)  Medications Ordered in UC Medications - No data to display  Initial Impression / Assessment and Plan / UC Course  I have reviewed the triage vital signs and the nursing notes.  Pertinent labs & imaging results that were available during my care of the patient were reviewed by me and considered in my medical decision making (see chart for details).     UA unremarkable.  Low suspicion for UTI or kidney stone given UA results.  I am suspicious of STD so cytology swab is pending.  Patient declined blood work for HIV or syphilis.  Advised patient to follow-up if any symptoms persist or worsen.  Patient verbalized understanding and was agreeable with plan. Final Clinical Impressions(s) / UC Diagnoses   Final diagnoses:  Dysuria  Urinary frequency  Screening examination for venereal disease     Discharge Instructions      Urine was clear.  Penile swab is pending.  We will call if it is positive.  Follow-up if any symptoms persist or worsen.    ED Prescriptions   None    PDMP not reviewed this encounter.   Gustavus Bryant, [place] 01/08/23 [phone]

## 2023-01-08 NOTE — Discharge Instructions (Signed)
Urine was clear.  Penile swab is pending.  We will call if it is positive.  Follow-up if any symptoms persist or worsen.

## 2023-01-08 NOTE — ED Triage Notes (Signed)
C/O suprapubic discomfort, polyuria onset 4-5 days. Denies fevers. Denies penile discharge.

## 2023-01-09 LAB — CYTOLOGY, (ORAL, ANAL, URETHRAL) ANCILLARY ONLY
Chlamydia: NEGATIVE
Comment: NEGATIVE
Comment: NEGATIVE
Comment: NORMAL
Neisseria Gonorrhea: NEGATIVE
Trichomonas: POSITIVE — AB

## 2023-01-10 ENCOUNTER — Telehealth: Payer: Self-pay | Admitting: Emergency Medicine

## 2023-01-10 MED ORDER — METRONIDAZOLE 500 MG PO TABS: 500.0000 mg | ORAL_TABLET | Freq: Two times a day (BID) | ORAL | 0 refills | Status: DC

## 2023-01-10 NOTE — Telephone Encounter (Signed)
Pt called saw his abnormal results on my chart; meds sent in

## 2023-01-13 ENCOUNTER — Other Ambulatory Visit: Payer: Self-pay | Admitting: Adult Health

## 2023-01-13 DIAGNOSIS — E1169 Type 2 diabetes mellitus with other specified complication: Secondary | ICD-10-CM

## 2023-01-13 NOTE — Telephone Encounter (Signed)
  The original prescription was discontinued on 04/15/2022 by Shirline Frees, NP. Renewing this prescription may not be appropriate.

## 2023-01-24 ENCOUNTER — Other Ambulatory Visit: Payer: Self-pay | Admitting: Cardiovascular Disease

## 2023-01-24 DIAGNOSIS — I509 Heart failure, unspecified: Secondary | ICD-10-CM

## 2023-01-26 ENCOUNTER — Other Ambulatory Visit: Payer: Self-pay | Admitting: Adult Health

## 2023-01-26 ENCOUNTER — Other Ambulatory Visit: Payer: Self-pay | Admitting: Cardiovascular Disease

## 2023-01-26 ENCOUNTER — Ambulatory Visit: Payer: BC Managed Care – PPO | Admitting: Family Medicine

## 2023-01-26 ENCOUNTER — Encounter: Payer: Self-pay | Admitting: Family Medicine

## 2023-01-26 ENCOUNTER — Encounter: Payer: Self-pay | Admitting: Adult Health

## 2023-01-26 VITALS — BP 126/82 | HR 78 | Temp 98.5°F | Wt 305.0 lb

## 2023-01-26 DIAGNOSIS — I10 Essential (primary) hypertension: Secondary | ICD-10-CM

## 2023-01-26 DIAGNOSIS — Z209 Contact with and (suspected) exposure to unspecified communicable disease: Secondary | ICD-10-CM

## 2023-01-26 DIAGNOSIS — N529 Male erectile dysfunction, unspecified: Secondary | ICD-10-CM

## 2023-01-26 DIAGNOSIS — I5022 Chronic systolic (congestive) heart failure: Secondary | ICD-10-CM

## 2023-01-26 LAB — URINALYSIS, MICROSCOPIC ONLY
RBC / HPF: NONE SEEN (ref 0–?)
WBC, UA: NONE SEEN (ref 0–?)

## 2023-01-26 MED ORDER — SILDENAFIL CITRATE 100 MG PO TABS: 100.0000 mg | ORAL_TABLET | Freq: Every day | ORAL | 0 refills | Status: DC | PRN

## 2023-01-26 NOTE — Progress Notes (Signed)
   Subjective:    Patient ID: Vernon Reed, male    DOB: 06-20-1976, 47 y.o.   MRN: [id number]  HPI Here for several issues. First he asks for STD testing. He has no symptoms but he is worried about a sexual encounter he had about 10 days ago. He also asks for help with erections. He has never tried anything for this.    Review of Systems  Constitutional: Negative.   Respiratory: Negative.    Cardiovascular: Negative.   Genitourinary: Negative.        Objective:   Physical Exam Constitutional:      Appearance: Normal appearance.  Cardiovascular:     Rate and Rhythm: Normal rate and regular rhythm.     Pulses: Normal pulses.     Heart sounds: Normal heart sounds.  Pulmonary:     Effort: Pulmonary effort is normal.     Breath sounds: Normal breath sounds.  Neurological:     Mental Status: He is alert.           Assessment & Plan:  We will send him for STD testing. He will also try Viagra 100 mg as needed for the ED. Gershon Crane, MD

## 2023-01-27 ENCOUNTER — Encounter: Payer: Self-pay | Admitting: Family Medicine

## 2023-01-27 LAB — C. TRACHOMATIS/N. GONORRHOEAE RNA
C. trachomatis RNA, TMA: NOT DETECTED
N. gonorrhoeae RNA, TMA: NOT DETECTED

## 2023-01-27 LAB — RPR: RPR Ser Ql: NONREACTIVE

## 2023-01-27 LAB — HIV ANTIBODY (ROUTINE TESTING W REFLEX): HIV 1&2 Ab, 4th Generation: NONREACTIVE

## 2023-01-27 NOTE — Telephone Encounter (Signed)
Please advise 

## 2023-01-27 NOTE — Telephone Encounter (Signed)
He can take these together, but he should not take any more than 1/2 tablet (50 mg) per day. He also needs to monitor his BP closely because this combination could cause his BP to drop too low

## 2023-01-29 NOTE — Telephone Encounter (Signed)
Spoke with pt reviewed lab results with pt verbalized understanding.

## 2023-02-03 ENCOUNTER — Other Ambulatory Visit: Payer: Self-pay | Admitting: Cardiovascular Disease

## 2023-02-03 DIAGNOSIS — I509 Heart failure, unspecified: Secondary | ICD-10-CM

## 2023-02-03 DIAGNOSIS — I5022 Chronic systolic (congestive) heart failure: Secondary | ICD-10-CM

## 2023-02-03 DIAGNOSIS — I10 Essential (primary) hypertension: Secondary | ICD-10-CM

## 2023-02-21 ENCOUNTER — Other Ambulatory Visit: Payer: Self-pay | Admitting: Cardiovascular Disease

## 2023-02-21 DIAGNOSIS — I509 Heart failure, unspecified: Secondary | ICD-10-CM

## 2023-03-11 ENCOUNTER — Other Ambulatory Visit: Payer: Self-pay | Admitting: Family

## 2023-03-11 MED ORDER — PROMETHAZINE VC 6.25-5 MG/5ML PO SYRP: 5.0000 mL | ORAL_SOLUTION | Freq: Four times a day (QID) | ORAL | 0 refills | Status: DC | PRN

## 2023-03-11 MED ORDER — METHYLPREDNISOLONE 4 MG PO TBPK: ORAL_TABLET | ORAL | 0 refills | Status: DC

## 2023-03-24 ENCOUNTER — Other Ambulatory Visit: Payer: Self-pay | Admitting: Adult Health

## 2023-03-24 DIAGNOSIS — E1169 Type 2 diabetes mellitus with other specified complication: Secondary | ICD-10-CM

## 2023-03-27 NOTE — Telephone Encounter (Signed)
Patient calling to check on progress of this refill request

## 2023-03-28 ENCOUNTER — Other Ambulatory Visit: Payer: Self-pay | Admitting: Cardiovascular Disease

## 2023-03-28 DIAGNOSIS — I5022 Chronic systolic (congestive) heart failure: Secondary | ICD-10-CM

## 2023-03-28 DIAGNOSIS — I10 Essential (primary) hypertension: Secondary | ICD-10-CM

## 2023-03-30 MED ORDER — ENTRESTO 97-103 MG PO TABS: 1.0000 | ORAL_TABLET | Freq: Two times a day (BID) | ORAL | 1 refills | Status: DC

## 2023-03-31 NOTE — Telephone Encounter (Signed)
Patient need to schedule for more refills.

## 2023-04-01 ENCOUNTER — Encounter: Payer: Self-pay | Admitting: Adult Health

## 2023-04-01 ENCOUNTER — Ambulatory Visit (INDEPENDENT_AMBULATORY_CARE_PROVIDER_SITE_OTHER): Payer: BC Managed Care – PPO | Admitting: Adult Health

## 2023-04-01 ENCOUNTER — Other Ambulatory Visit (HOSPITAL_COMMUNITY)
Admission: RE | Admit: 2023-04-01 | Discharge: 2023-04-01 | Disposition: A | Discharge status: Home / Self Care | Payer: BC Managed Care – PPO | Source: Ambulatory Visit | Attending: Adult Health | Admitting: Adult Health

## 2023-04-01 ENCOUNTER — Other Ambulatory Visit (INDEPENDENT_AMBULATORY_CARE_PROVIDER_SITE_OTHER): Payer: BC Managed Care – PPO

## 2023-04-01 VITALS — BP 110/82 | HR 125 | Temp 98.2°F | Ht 71.25 in | Wt 306.0 lb

## 2023-04-01 DIAGNOSIS — I5022 Chronic systolic (congestive) heart failure: Secondary | ICD-10-CM

## 2023-04-01 DIAGNOSIS — Z113 Encounter for screening for infections with a predominantly sexual mode of transmission: Secondary | ICD-10-CM | POA: Diagnosis present

## 2023-04-01 DIAGNOSIS — Z1211 Encounter for screening for malignant neoplasm of colon: Secondary | ICD-10-CM

## 2023-04-01 DIAGNOSIS — I10 Essential (primary) hypertension: Secondary | ICD-10-CM | POA: Diagnosis not present

## 2023-04-01 DIAGNOSIS — Z125 Encounter for screening for malignant neoplasm of prostate: Secondary | ICD-10-CM

## 2023-04-01 DIAGNOSIS — Z0000 Encounter for general adult medical examination without abnormal findings: Secondary | ICD-10-CM | POA: Diagnosis not present

## 2023-04-01 DIAGNOSIS — Z7985 Long-term (current) use of injectable non-insulin antidiabetic drugs: Secondary | ICD-10-CM | POA: Diagnosis not present

## 2023-04-01 DIAGNOSIS — E1169 Type 2 diabetes mellitus with other specified complication: Secondary | ICD-10-CM | POA: Diagnosis not present

## 2023-04-01 LAB — COMPREHENSIVE METABOLIC PANEL
ALT: 22 U/L (ref 0–53)
AST: 13 U/L (ref 0–37)
Albumin: 4 g/dL (ref 3.5–5.2)
Alkaline Phosphatase: 53 U/L (ref 39–117)
BUN: 12 mg/dL (ref 6–23)
CO2: 30 mEq/L (ref 19–32)
Calcium: 8.7 mg/dL (ref 8.4–10.5)
Chloride: 103 mEq/L (ref 96–112)
Creatinine, Ser: 1.15 mg/dL (ref 0.40–1.50)
GFR: 75.35 mL/min (ref >60.00)
Glucose, Bld: 95 mg/dL (ref 70–99)
Potassium: 3.9 mEq/L (ref 3.5–5.1)
Sodium: 140 mEq/L (ref 135–145)
Total Bilirubin: 1.3 mg/dL — ABNORMAL HIGH (ref 0.2–1.2)
Total Protein: 6.6 g/dL (ref 6.0–8.3)

## 2023-04-01 LAB — HEMOGLOBIN A1C: Hgb A1c MFr Bld: 6 % (ref 4.6–6.5)

## 2023-04-01 LAB — TSH: TSH: 2.56 u[IU]/mL (ref 0.35–5.50)

## 2023-04-01 LAB — CBC
HCT: 41 % (ref 39.0–52.0)
Hemoglobin: 13.1 g/dL (ref 13.0–17.0)
MCHC: 32 g/dL (ref 30.0–36.0)
MCV: 93.5 fl (ref 78.0–100.0)
Platelets: 265 10*3/uL (ref 150.0–400.0)
RBC: 4.39 Mil/uL (ref 4.22–5.81)
RDW: 14 % (ref 11.5–15.5)
WBC: 7.3 10*3/uL (ref 4.0–10.5)

## 2023-04-01 LAB — LIPID PANEL
Cholesterol: 166 mg/dL (ref 0–200)
HDL: 29.4 mg/dL — ABNORMAL LOW (ref >39.00)
LDL Cholesterol: 116 mg/dL — ABNORMAL HIGH (ref 0–99)
NonHDL: 136.43
Total CHOL/HDL Ratio: 6
Triglycerides: 102 mg/dL (ref 0.0–149.0)
VLDL: 20.4 mg/dL (ref 0.0–40.0)

## 2023-04-01 LAB — MICROALBUMIN / CREATININE URINE RATIO
Creatinine,U: 91.4 mg/dL
Microalb Creat Ratio: 0.8 mg/g (ref 0.0–30.0)
Microalb, Ur: <0.7 mg/dL (ref 0.0–1.9)

## 2023-04-01 LAB — PSA: PSA: 0.27 ng/mL (ref 0.10–4.00)

## 2023-04-01 MED ORDER — OZEMPIC (2 MG/DOSE) 8 MG/3ML ~~LOC~~ SOPN
2.0000 mg | PEN_INJECTOR | SUBCUTANEOUS | 1 refills | Status: AC
Start: 2023-04-01 — End: 2023-06-30

## 2023-04-01 NOTE — Patient Instructions (Signed)
It was great seeing you today   We will follow up with you regarding your lab work   Please let me know if you need anything   

## 2023-04-01 NOTE — Progress Notes (Signed)
Subjective:    Patient ID: Vernon Reed, male    DOB: [birth year], 47 y.o.   MRN: [id number]  HPI Patient presents for yearly preventative medicine examination. He is a pleasant 47 year old male who  has a past medical history of AKI (acute kidney injury) (HCC) (07/27/2013), Cardiomyopathy (HCC) (07/25/2013), CHF (congestive heart failure) (HCC), Chicken pox, Chronic systolic heart failure (HCC) (4/0/9811), GERD (gastroesophageal reflux disease), Headache, Heart block (07/25/2013), Hyperlipemia, Hypertension, Hypokalemia (07/26/2013), Hypoxia (12/06/2014), Obesity, morbid (HCC) (12/27/2013), OSA (obstructive sleep apnea), Sleep apnea, SOB (shortness of breath) (07/23/2013), Tachy-brady syndrome (HCC) (07/26/2013), Tachycardia (07/23/2013), and Type II diabetes mellitus (HCC).  He was last seen 1 year ago   Diabetes mellitus type 2-he was maintained on Ozempic 2 mg  and was tolerating this medication well.  He checks his blood sugar every other day and reports readings in the 90-low 100's. Ozempic has caused decreased appetite but he still eats horribly when he eats Lab Results  Component Value Date   HGBA1C 6.2 03/27/2022   HTN -managed with Coreg 12.5 mg - 1.5 tablets BID, Lasix 40 mg daily, hydralazine 50 mg 3 times daily, isosorbide 30 mg daily, metoprolol 100 mg twice daily, spirloactone 25 mg daily and Entresto 97-103 mg twice daily BP Readings from Last 3 Encounters:  01/26/23 126/82  01/08/23 126/82  06/16/22 118/80    History of CHF -managed by the heart failure clinic.  The EF is now normal.  He has moderate left ventricular hypertrophy.  He has grade 1 diastolic dysfunctions.  This is managed by cardiology with Coreg 12.5 mg - 1.5 tablets BID, Lasix 40 mg daily, hydralazine 50 mg 3 times daily, isosorbide 30 mg daily, metoprolol 100 mg twice daily, spirloactone 25 mg daily and Entresto 97-103 mg twice daily  STD testing - denies symptoms but would like to be tested to make sure.    Obesity - he has been eating horrible and does not exercise.  Wt Readings from Last 3 Encounters:  04/01/23 (!) 306 lb (138.8 kg)  01/26/23 (!) 305 lb (138.3 kg)  06/16/22 (!) 314 lb 3.2 oz (142.5 kg)    All immunizations and health maintenance protocols were reviewed with the patient and needed orders were placed.  Appropriate screening laboratory values were ordered for the patient including screening of hyperlipidemia, renal function and hepatic function. If indicated by BPH, a PSA was ordered.  Medication reconciliation,  past medical history, social history, problem list and allergies were reviewed in detail with the patient  Goals were established with regard to weight loss, exercise, and  diet in compliance with medications  He is overdue for colon cancer screening   Review of Systems  Constitutional: Negative.   HENT: Negative.    Eyes: Negative.   Respiratory: Negative.    Cardiovascular: Negative.   Gastrointestinal: Negative.   Endocrine: Negative.   Genitourinary: Negative.   Musculoskeletal: Negative.   Skin: Negative.   Allergic/Immunologic: Negative.   Neurological: Negative.   Hematological: Negative.   Psychiatric/Behavioral: Negative.    All other systems reviewed and are negative.  Past Medical History:  Diagnosis Date   AKI (acute kidney injury) (HCC) 07/27/2013   Cardiomyopathy (HCC) 07/25/2013   CHF (congestive heart failure) (HCC)    Chicken pox    Chronic systolic heart failure (HCC) 01/04/2014   GERD (gastroesophageal reflux disease)    Headache    "maybe 5 times/yr; nothing regular" (12/06/2014)   Heart block 07/25/2013   Hyperlipemia  Hypertension    Hypokalemia 07/26/2013   Hypoxia 12/06/2014   Obesity, morbid (HCC) 12/27/2013   OSA (obstructive sleep apnea)    Sleep apnea    SOB (shortness of breath) 07/23/2013   Tachy-brady syndrome (HCC) 07/26/2013   Tachycardia 07/23/2013   Type II diabetes mellitus (HCC)     Social History    Socioeconomic History   Marital status: Divorced    Spouse name: Not on file   Number of children: 3   Years of education: Not on file   Highest education level: Bachelor's degree (e.g., BA, AB, BS)  Occupational History   Occupation: Teaching laboratory technician: [employer]  Tobacco Use   Smoking status: Never   Smokeless tobacco: Never  Vaping Use   Vaping status: Never Used  Substance and Sexual Activity   Alcohol use: No    Comment: rare   Drug use: No   Sexual activity: Yes  Other Topics Concern   Not on file  Social History Narrative   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       Social Determinants of Health   Financial Resource Strain: Low Risk  (03/31/2023)   Overall Financial Resource Strain (CARDIA)    Difficulty of Paying Living Expenses: Not very hard  Food Insecurity: No Food Insecurity (03/31/2023)   Hunger Vital Sign    Worried About Running Out of Food in the Last Year: Never true    Ran Out of Food in the Last Year: Never true  Transportation Needs: No Transportation Needs (03/31/2023)   PRAPARE - Administrator, Civil Service (Medical): No    Lack of Transportation (Non-Medical): No  Physical Activity: Insufficiently Active (03/31/2023)   Exercise Vital Sign    Days of Exercise per Week: 1 day    Minutes of Exercise per Session: 10 min  Stress: No Stress Concern Present (03/31/2023)   [employer] of Occupational Health - Occupational Stress Questionnaire    Feeling of Stress : Not at all  Social Connections: Moderately Integrated (03/31/2023)   Social Connection and Isolation Panel [NHANES]    Frequency of Communication with Friends and Family: More than three times a week    Frequency of Social Gatherings with Friends and Family: Twice a week    Attends Religious Services: More than 4 times per year    Active Member of [employer] or Organizations: Yes    Attends Engineer, structural: More than 4 times per year     Marital Status: Divorced  Intimate Partner Violence: Unknown (10/07/2021)   Received from [employer], [facility]   HITS    Physically Hurt: Not on file    Insult or Talk Down To: Not on file    Threaten Physical Harm: Not on file    Scream or Curse: Not on file    Past Surgical History:  Procedure Laterality Date   ABDOMINAL SURGERY  ~ 1987   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   APPENDECTOMY  ~ 1989   CARDIAC CATHETERIZATION  07/2013   LEFT AND RIGHT HEART CATHETERIZATION WITH CORONARY ANGIOGRAM N/A 07/26/2013   Procedure: LEFT AND RIGHT HEART CATHETERIZATION WITH CORONARY ANGIOGRAM;  Surgeon: Marykay Lex, MD;  Location: [facility] CATH LAB;  Service: Cardiovascular;  Laterality: N/A;    Family History  Problem Relation Age of Onset   Hypertension Mother    Angioedema Mother    Hypertension Father    Sleep apnea Father  No Known Allergies  Current Outpatient Medications on File Prior to Visit  Medication Sig Dispense Refill   methylPREDNISolone (MEDROL DOSEPAK) 4 MG TBPK tablet As directed 21 tablet 0   promethazine-phenylephrine 6.25-5 MG/5ML SYRP Take 5 mLs by mouth every 6 (six) hours as needed for congestion. 118 mL 0   aspirin 81 MG EC tablet Take 1 tablet (81 mg total) by mouth daily. 30 tablet 6   Blood Glucose Monitoring Suppl (TRUE METRIX METER) W/DEVICE KIT 1 each by Does not apply route once as needed. 1 kit 0   carvedilol (COREG) 12.5 MG tablet Take 1.5 tablets (18.75 mg total) by mouth 2 (two) times daily with a meal. 270 tablet 2   furosemide (LASIX) 40 MG tablet TAKE 1 TABLET BY MOUTH EVERY DAY AS NEEDED 90 tablet 2   glucose blood (TRUE METRIX BLOOD GLUCOSE TEST) test strip Used 3 times daily before meals. 100 each 12   hydrALAZINE (APRESOLINE) 50 MG tablet TAKE 1 TABLET BY MOUTH THREE TIMES A DAY 270 tablet 2   isosorbide mononitrate (IMDUR) 30 MG 24 hr tablet TAKE 1 TABLET BY MOUTH EVERY DAY 30 tablet 1   metroNIDAZOLE (FLAGYL) 500 MG tablet Take 1  tablet (500 mg total) by mouth 2 (two) times daily. 14 tablet 0   OZEMPIC, 2 MG/DOSE, 8 MG/3ML SOPN Inject 2 mg into the skin once a week. 3 mL 0   potassium chloride (KLOR-CON) 10 MEQ tablet TAKE 1 TABLET BY MOUTH EVERY DAY 90 tablet 1   predniSONE (DELTASONE) 20 MG tablet Take 2 tablets (40 mg total) by mouth daily with breakfast. 10 tablet 0   sacubitril-valsartan (ENTRESTO) 97-103 MG Take 1 tablet by mouth 2 (two) times daily. 90 tablet 1   Semaglutide,0.25 or 0.5MG /DOS, 2 MG/3ML SOPN Inject 2 mg into the skin once a week 3 mL 1   sildenafil (VIAGRA) 100 MG tablet Take 1 tablet (100 mg total) by mouth daily as needed for erectile dysfunction. 10 tablet 0   spironolactone (ALDACTONE) 25 MG tablet TAKE 1 TABLET BY MOUTH EVERY DAY 30 tablet 4   TRUEPLUS LANCETS 30G MISC 1 each by Does not apply route every morning. 50 each 11   No current facility-administered medications on file prior to visit.    Pulse (!) 125   Temp 98.2 F (36.8 C) (Oral)   Ht 5' 11.25" (1.81 m)   Wt (!) 306 lb (138.8 kg)   SpO2 94%   BMI 42.38 kg/m       Objective:   Physical Exam Vitals and nursing note reviewed.  Constitutional:      General: He is not in acute distress.    Appearance: Normal appearance. He is obese. He is not ill-appearing.  HENT:     Head: Normocephalic and atraumatic.     Right Ear: Tympanic membrane, ear canal and external ear normal. There is no impacted cerumen.     Left Ear: Tympanic membrane, ear canal and external ear normal. There is no impacted cerumen.     Nose: Nose normal. No congestion or rhinorrhea.     Mouth/Throat:     Mouth: Mucous membranes are moist.     Pharynx: Oropharynx is clear.  Eyes:     Extraocular Movements: Extraocular movements intact.     Conjunctiva/sclera: Conjunctivae normal.     Pupils: Pupils are equal, round, and reactive to light.  Neck:     Vascular: No carotid bruit.  Cardiovascular:     Rate and  Rhythm: Normal rate and regular rhythm.      Pulses: Normal pulses.     Heart sounds: No murmur heard.    No friction rub. No gallop.  Pulmonary:     Effort: Pulmonary effort is normal.     Breath sounds: Normal breath sounds.  Abdominal:     General: Abdomen is flat. Bowel sounds are normal. There is no distension.     Palpations: Abdomen is soft. There is no mass.     Tenderness: There is no abdominal tenderness. There is no guarding or rebound.     Hernia: No hernia is present.  Musculoskeletal:        General: Normal range of motion.     Cervical back: Normal range of motion and neck supple.  Lymphadenopathy:     Cervical: No cervical adenopathy.  Skin:    General: Skin is warm and dry.     Capillary Refill: Capillary refill takes less than 2 seconds.  Neurological:     General: No focal deficit present.     Mental Status: He is alert and oriented to person, place, and time.  Psychiatric:        Mood and Affect: Mood normal.        Behavior: Behavior normal.        Thought Content: Thought content normal.        Judgment: Judgment normal.           Assessment & Plan:  1. Routine general medical examination at a health care facility Today patient counseled on age appropriate routine health concerns for screening and prevention, each reviewed and up to date or declined. Immunizations reviewed and up to date or declined. Labs ordered and reviewed. Risk factors for depression reviewed and negative. Hearing function and visual acuity are intact. ADLs screened and addressed as needed. Functional ability and level of safety reviewed and appropriate. Education, counseling and referrals performed based on assessed risks today. Patient provided with a copy of personalized plan for preventive services. - Follow up in one year or sooner if needed   2. Essential hypertension - Controlled. No change in medication  - Lipid panel; Future - TSH; Future - CBC; Future - Comprehensive metabolic panel; Future  3. Type 2 diabetes  mellitus with other specified complication, unspecified whether long term insulin use (HCC)  - Lipid panel; Future - TSH; Future - CBC; Future - Comprehensive metabolic panel; Future - Hemoglobin A1c; Future - Microalbumin / creatinine urine ratio; Future - Semaglutide, 2 MG/DOSE, (OZEMPIC, 2 MG/DOSE,) 8 MG/3ML SOPN; Inject 2 mg into the skin once a week.  Dispense: 9 mL; Refill: 1  4. Long-term current use of injectable noninsulin antidiabetic medication - Continue with Ozempic  - Needs to work on lifestyle modifications  - 3-6 months follow up depending on A1c - Lipid panel; Future - TSH; Future - CBC; Future - Comprehensive metabolic panel; Future - Hemoglobin A1c; Future - Microalbumin / creatinine urine ratio; Future  5. Obesity, morbid (HCC) - Needs to start eating healthy and exercising  - Lipid panel; Future - TSH; Future - CBC; Future - Comprehensive metabolic panel; Future  6. Chronic systolic heart failure (HCC) - Per cardiology  - Lipid panel; Future - TSH; Future - CBC; Future - Comprehensive metabolic panel; Future   7. Prostate cancer screening  - PSA; Future  8. Screening for STD (sexually transmitted disease)  - Urine cytology ancillary only; Future - HIV Antibody (routine testing w rflx); Future -  RPR; Future  9. Screening for colon cancer  - Cologuard  Shirline Frees, NP

## 2023-04-02 ENCOUNTER — Other Ambulatory Visit: Payer: Self-pay

## 2023-04-02 LAB — RPR: RPR Ser Ql: NONREACTIVE

## 2023-04-02 LAB — HIV ANTIBODY (ROUTINE TESTING W REFLEX): HIV 1&2 Ab, 4th Generation: NONREACTIVE

## 2023-04-02 MED ORDER — ATORVASTATIN CALCIUM 10 MG PO TABS: 10.0000 mg | ORAL_TABLET | Freq: Every day | ORAL | 1 refills | Status: DC

## 2023-04-03 LAB — URINE CYTOLOGY ANCILLARY ONLY
Chlamydia: NEGATIVE
Comment: NEGATIVE
Comment: NEGATIVE
Comment: NORMAL
Neisseria Gonorrhea: NEGATIVE
Trichomonas: NEGATIVE

## 2023-04-06 ENCOUNTER — Telehealth: Payer: Self-pay | Admitting: Adult Health

## 2023-04-06 NOTE — Telephone Encounter (Signed)
Pt called to say the pharmacy is still waiting for the PA for the Ozempic (2 MG/DOSE) Semaglutide, 2 MG/DOSE, (OZEMPIC, 2 MG/DOSE,) 8 MG/3ML SOPN  refill. Pt informed NP is OOO on Mondays.

## 2023-04-06 NOTE — Telephone Encounter (Signed)
Requesting a new CPAP machine

## 2023-04-07 NOTE — Telephone Encounter (Signed)
Left message to return phone call. This needs to come from pulmonary not [facility].

## 2023-04-07 NOTE — Telephone Encounter (Signed)
Pt returned call. Relayed message regarding the cpap having to come from pulmonary, still requests a call regarding the cpap for his ozempic

## 2023-04-08 NOTE — Telephone Encounter (Signed)
Spoke to pt and advised that I did not receive a PA faxed nor on Cover my meds. Pt stated that the pharmacy faxed it. Will call the pharmacy to follow up. A message was also sent to the PA team.

## 2023-04-08 NOTE — Telephone Encounter (Signed)
Called and spoke with pharmacy and they stated they sent the form. I asked if they could resend. They agreed. Waiting for fax to start PA.

## 2023-04-08 NOTE — Telephone Encounter (Signed)
Please advise. I have not seen any PA come through for pt. Tried to call pt for update but no answer.

## 2023-04-08 NOTE — Telephone Encounter (Signed)
Pt returned call

## 2023-04-08 NOTE — Telephone Encounter (Signed)
Left message to return phone call.

## 2023-04-09 NOTE — Telephone Encounter (Signed)
*  pleases advise pt of this message: PA was sent to the PA team and will update pt with results.

## 2023-04-09 NOTE — Telephone Encounter (Signed)
Was calling to advised that PA was received and faxed to our PA department

## 2023-04-09 NOTE — Telephone Encounter (Signed)
If pt returns call please advise of message below.

## 2023-04-09 NOTE — Telephone Encounter (Signed)
Pt called, returning CMA's call. CMA was unavailable. Pt asked that CMA call back at their earliest convenience. 

## 2023-04-10 ENCOUNTER — Telehealth: Payer: Self-pay

## 2023-04-10 ENCOUNTER — Other Ambulatory Visit (HOSPITAL_COMMUNITY): Payer: Self-pay

## 2023-04-10 NOTE — Telephone Encounter (Signed)
Pharmacy Patient Advocate Encounter   Received notification from Pt Calls Messages that prior authorization for OZEMPIC (2 MG/DOSE) 8MG /3ML pen-injectors is required/requested.   Insurance verification completed.   The patient is insured through [facility] .   Per test claim: PA required; PA submitted to [facility] via CoverMyMeds Key/confirmation #/EOC [id number] Status is pending

## 2023-04-10 NOTE — Telephone Encounter (Signed)
Noted  

## 2023-04-10 NOTE — Telephone Encounter (Signed)
Pt was advised of message below by front office staff member.

## 2023-04-10 NOTE — Telephone Encounter (Signed)
Pt called asking to speak with Enrique Sack

## 2023-04-10 NOTE — Telephone Encounter (Signed)
PA request has been Submitted. New Encounter created for follow up. For additional info see Pharmacy Prior Auth telephone encounter from 04/10/23.

## 2023-04-10 NOTE — Telephone Encounter (Deleted)
Pharmacy Patient Advocate Encounter   Received notification from Pt Calls Messages that prior authorization for [facility] is required/requested.     Per test claim: The current 84 day co-pay is, $74.99.  No PA needed at this time. This test claim was processed through [facility]- copay amounts may vary at other pharmacies due to pharmacy/plan contracts, or as the patient moves through the different stages of their insurance plan.

## 2023-04-13 ENCOUNTER — Other Ambulatory Visit (HOSPITAL_COMMUNITY): Payer: Self-pay

## 2023-04-13 NOTE — Telephone Encounter (Signed)
Noted  

## 2023-04-13 NOTE — Telephone Encounter (Signed)
Pharmacy Patient Advocate Encounter  Received notification from [facility] that Prior Authorization for OZEMPIC (2 MG/DOSE) 8MG /3ML  has been APPROVED from 04/10/23 to 04/09/26   PA #/Case ID/Reference #: [id number]

## 2023-04-14 NOTE — Telephone Encounter (Signed)
Patient notified of update  and verbalized understanding. 

## 2023-04-21 ENCOUNTER — Other Ambulatory Visit: Payer: Self-pay | Admitting: Cardiovascular Disease

## 2023-04-21 DIAGNOSIS — I5022 Chronic systolic (congestive) heart failure: Secondary | ICD-10-CM

## 2023-04-21 DIAGNOSIS — I10 Essential (primary) hypertension: Secondary | ICD-10-CM

## 2023-04-29 ENCOUNTER — Telehealth: Payer: Self-pay | Admitting: Adult Health

## 2023-04-29 NOTE — Telephone Encounter (Signed)
Pt's son scheduled for 05/08/23 at 9 am.

## 2023-04-29 NOTE — Telephone Encounter (Signed)
Pt called to ask if NP would be willing to take his son Delfino Lovett (DOB: 10/20/2002) on as a new patient.  Pt was informed that NP was not taking new patients at this time.  Pt stated he would consider it a "personal favor".

## 2023-04-29 NOTE — Telephone Encounter (Signed)
Please advise 

## 2023-04-29 NOTE — Telephone Encounter (Signed)
Noted  

## 2023-05-15 ENCOUNTER — Other Ambulatory Visit: Payer: Self-pay | Admitting: Cardiovascular Disease

## 2023-05-15 DIAGNOSIS — I509 Heart failure, unspecified: Secondary | ICD-10-CM

## 2023-05-25 ENCOUNTER — Encounter: Payer: Self-pay | Admitting: Adult Health

## 2023-05-25 NOTE — Telephone Encounter (Signed)
 Care team updated and letter sent for eye exam notes.

## 2023-05-28 ENCOUNTER — Other Ambulatory Visit (HOSPITAL_COMMUNITY): Payer: Self-pay

## 2023-06-17 ENCOUNTER — Other Ambulatory Visit (HOSPITAL_COMMUNITY): Payer: Self-pay

## 2023-06-24 ENCOUNTER — Other Ambulatory Visit: Payer: Self-pay | Admitting: Family

## 2023-06-24 MED ORDER — CYCLOBENZAPRINE HCL 10 MG PO TABS: 10.0000 mg | ORAL_TABLET | Freq: Three times a day (TID) | ORAL | 0 refills | Status: DC | PRN

## 2023-06-24 MED ORDER — PREDNISONE 20 MG PO TABS: 40.0000 mg | ORAL_TABLET | Freq: Every day | ORAL | 0 refills | Status: DC

## 2023-06-25 ENCOUNTER — Other Ambulatory Visit: Payer: Self-pay | Admitting: Cardiovascular Disease

## 2023-06-28 ENCOUNTER — Other Ambulatory Visit: Payer: Self-pay | Admitting: Cardiovascular Disease

## 2023-07-07 ENCOUNTER — Telehealth (HOSPITAL_COMMUNITY): Payer: Self-pay | Admitting: Pharmacy Technician

## 2023-07-07 ENCOUNTER — Other Ambulatory Visit (HOSPITAL_COMMUNITY): Payer: Self-pay

## 2023-07-07 NOTE — Telephone Encounter (Signed)
 Patient Advocate Encounter   Received notification from [employer] that prior authorization for Vernon Reed is required.   PA submitted on CoverMyMeds Key  [id number] Status is pending   Will continue to follow.

## 2023-07-09 NOTE — Telephone Encounter (Signed)
 Advanced Heart Failure Patient Advocate Encounter  Prior Authorization for Sherryll Burger has been approved.    PA# [id number] Effective dates: 07/07/23 through 07/06/24  Archer Asa, CPhT

## 2023-07-13 ENCOUNTER — Encounter: Payer: Self-pay | Admitting: Cardiovascular Disease

## 2023-07-13 NOTE — Progress Notes (Signed)
 Cardiology Office Note:    Date:  07/14/2023   ID:  Vernon Reed, DOB August 09, 1975, MRN [id number]  PCP:  Merna Huxley, NP  Cardiologist:  Joylene Passe, previous CHF clinic  Electrophysiologist:  None   Referring MD: Merna Huxley, NP   Chief Complaint  Patient presents with   Congestive Heart Failure   Hypertension             April 14, 2019    Vernon Reed is a 48 y.o. male with a hx of chronic systolic congestive heart failure.  He has been seen by the heart failure clinic for the past several years and his ejection fraction is now normal.  He has moderate left ventricular hypertrophy.  He has grade 1 diastolic dysfunction.  Takes his meds,  Tries to avoid salt .  No CP  Able to do his normal activties  has been getting some exercise  Knows that he needs to lose some weight .  Has OSA , he wears a CPAP mask. Heart cath in 2015 revealed normal coronary arteries.  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 He has a hx of obesity .   Wt today is 307 lbs.  Has not lost any weight .    Busy with the band  Sings with [employer] for past several years  Still with [employer] also Is not workint out.    Eats unhealthy foods frequently  - after playing a jig, only fast foods are available  We discussed the fact that as he lost weight we could gradually cut back on some of the medications.  He needs to work on weight loss and improvement of his diet before we start cutting back on his medications.  May 21 2021:   Vernon Reed is seen today for follow-up visit for his congestive heart failure.  He has been eating lots of salty foods.  He plays in a band and does not always eat the healthiest food. Wt is 305 lbs.  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  .  Getting some exercise  On coreg , entresto , hydralazine   Takes the lasix  PRN, - perhaps QOD   June 30 , 2023  Vernon Reed is seen   Has been having some fluttering in his chest  Chest tightness  May have been stress.  Wt is 305  Diet has been poor  Vague chest discomfort. Fatigued Not so much shortness of breath    Dec. 11 , 2023  Vernon Reed is seen for follow up of his chronic diastolic CHF, HTN, obestiy Wt is 314 ( up 9 lbs )   Has not been paying attention to his diet Is now on Ozympic   No CP , no dyspnea    Jan. 7, 2024 Vernon Reed is seen for follow up of his morbid obesity and chronic diastolic dysfunction Wt is 304 lbs  His band played at the  [employer]  at [facility]  Getting some exercise    Past Medical History:  Diagnosis Date   AKI (acute kidney injury) (HCC) 07/27/2013   Cardiomyopathy (HCC) 07/25/2013   CHF (congestive heart failure) (HCC)    Chicken pox    Chronic systolic heart failure (  HCC) 01/04/2014   GERD (gastroesophageal reflux disease)    Headache    maybe 5 times/yr; nothing regular (12/06/2014)   Heart block 07/25/2013   Hyperlipemia    Hypertension    Hypokalemia 07/26/2013   Hypoxia 12/06/2014   Obesity, morbid (HCC) 12/27/2013   OSA (obstructive sleep apnea)    Sleep apnea    SOB (shortness of breath) 07/23/2013   Tachy-brady syndrome (HCC) 07/26/2013   Tachycardia 07/23/2013   Type II diabetes mellitus (HCC)     Past Surgical History:  Procedure Laterality Date   ABDOMINAL SURGERY  ~ 1987   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   APPENDECTOMY  ~ 1989   CARDIAC CATHETERIZATION  07/2013   LEFT AND RIGHT HEART CATHETERIZATION WITH CORONARY ANGIOGRAM N/A 07/26/2013   Procedure: LEFT AND RIGHT HEART CATHETERIZATION WITH CORONARY ANGIOGRAM;  Surgeon: Alm LELON Clay, MD;  Location: [facility] CATH LAB;  Service: Cardiovascular;  Laterality: N/A;    Current Medications: Current Meds  Medication Sig   aspirin  81 MG EC tablet Take 1  tablet (81 mg total) by mouth daily.   atorvastatin  (LIPITOR) 10 MG tablet Take 1 tablet (10 mg total) by mouth daily.   Blood Glucose Monitoring Suppl (TRUE METRIX METER) W/DEVICE KIT 1 each by Does not apply route once as needed.   carvedilol  (COREG ) 12.5 MG tablet Take 1.5 tablets (18.75 mg total) by mouth 2 (two) times daily with a meal.   cyclobenzaprine  (FLEXERIL ) 10 MG tablet Take 1 tablet (10 mg total) by mouth 3 (three) times daily as needed for muscle spasms.   furosemide  (LASIX ) 40 MG tablet TAKE 1 TABLET BY MOUTH EVERY DAY AS NEEDED   glucose blood (TRUE METRIX BLOOD GLUCOSE TEST) test strip Used 3 times daily before meals.   hydrALAZINE  (APRESOLINE ) 50 MG tablet TAKE 1 TABLET BY MOUTH THREE TIMES A DAY   isosorbide  mononitrate (IMDUR ) 30 MG 24 hr tablet Take 1 tablet (30 mg total) by mouth daily.   potassium chloride  (KLOR-CON ) 10 MEQ tablet TAKE 1 TABLET BY MOUTH EVERY DAY   predniSONE  (DELTASONE ) 20 MG tablet Take 2 tablets (40 mg total) by mouth daily with breakfast.   sacubitril -valsartan  (ENTRESTO ) 97-103 MG TAKE 1 TABLET BY MOUTH TWICE A DAY   sildenafil  (VIAGRA ) 100 MG tablet Take 1 tablet (100 mg total) by mouth daily as needed for erectile dysfunction.   spironolactone  (ALDACTONE ) 25 MG tablet TAKE 1 TABLET BY MOUTH EVERY DAY   TRUEPLUS LANCETS 30G MISC 1 each by Does not apply route every morning.     Allergies:   Patient has no known allergies.   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       Social Drivers of Health   Financial Resource Strain: Low Risk  (03/31/2023)  Overall Financial Resource Strain (CARDIA)    Difficulty of Paying Living Expenses: Not very hard  Food Insecurity: No Food Insecurity (03/31/2023)   Hunger Vital Sign    Worried About Running Out of Food in the Last Year: Never true    Ran Out of Food in the Last Year: Never true  Transportation Needs: No Transportation Needs (03/31/2023)   PRAPARE - Administrator, Civil Service (Medical): No    Lack of Transportation (Non-Medical): No  Physical Activity: Insufficiently Active (03/31/2023)   Exercise Vital Sign    Days of Exercise per Week: 1 day    Minutes of Exercise per Session: 10 min  Stress: No Stress Concern Present (03/31/2023)   [employer] of Occupational Health - Occupational Stress Questionnaire    Feeling of Stress : Not at all  Social Connections: Moderately Integrated (03/31/2023)   Social Connection and Isolation Panel [NHANES]    Frequency of Communication with Friends and Family: More than three times a week    Frequency of Social Gatherings with Friends and Family: Twice a week    Attends Religious Services: More than 4 times per year    Active Member of [employer] or Organizations: Yes    Attends Engineer, Structural: More than 4 times per year    Marital Status: Divorced     Family History: The patient's family history includes Angioedema in his mother; Hypertension in his father and mother; Sleep apnea in his father.  ROS:   Please see the history of present illness.     All other systems reviewed and are negative.  EKGs/Labs/Other Studies Reviewed:    The following studies were reviewed today:   EKG:    EKG Interpretation Date/Time:  Tuesday July 14 2023 09:04:20 EST Ventricular Rate:  91 PR Interval:  214 QRS Duration:  106 QT  Interval:  374 QTC Calculation: 460 R Axis:   -8  Text Interpretation: Sinus rhythm with 1st degree A-V block Minimal voltage criteria for LVH, may be normal variant ( R in aVL ) Nonspecific T wave abnormality Prolonged QT When compared with ECG of 14-Jun-2015 14:19, PR interval has increased ST no longer elevated in Anterolateral leads Inverted T waves have replaced nonspecific T wave abnormality in Lateral leads Confirmed by Alveta Mungo ([id number]) on 07/14/2023 9:40:08 AM     Recent Labs: 04/01/2023: ALT 22; BUN 12; Creatinine, Ser 1.15; Hemoglobin 13.1; Platelets 265.0; Potassium 3.9; Sodium 140; TSH 2.56  Recent Lipid Panel    Component Value Date/Time   CHOL 166 04/01/2023 1140   TRIG 102.0 04/01/2023 1140   HDL 29.40 (L) 04/01/2023 1140   CHOLHDL 6 04/01/2023 1140   VLDL 20.4 04/01/2023 1140   LDLCALC 116 (H) 04/01/2023 1140   LDLCALC 111 (H) 02/02/2020 0836    Physical Exam:     Physical Exam: Blood pressure 118/64, pulse 97, height 5' 11.5 (1.816 m), weight (!) 304 lb 9.6 oz (138.2 kg), SpO2 93%.       GEN:  moderately obese , middle age man,  in no acute distress HEENT: Normal NECK: No JVD; No carotid bruits LYMPHATICS: No lymphadenopathy CARDIAC: RRR , no murmurs, rubs, gallops RESPIRATORY:  Clear to auscultation without rales, wheezing or rhonchi  ABDOMEN: Soft, non-tender, non-distended MUSCULOSKELETAL:  No edema; No deformity  SKIN: Warm and dry NEUROLOGIC:  Alert and oriented x 3       ASSESSMENT:    1. Primary hypertension   2. Mixed hyperlipidemia  PLAN:       1.  History of chronic systolic congestive heart failure:   Pt is stable  Cont Entresto  97-103 twice daily, hydralazine  50 mg 3 times a day, isosorbide  30 mg a day, Lasix  40 mg a day, potassium chloride  10 mill equivalents a day, carvedilol  12.5 BID , spironolactone  25 mg a day   His last echo showed normal LVEF Cont current meds  Labs were ok in Sept.  Cont diet, exercise ,  weight loss     2.  HLD :   cont to work on diet, exercise, wt loss.         Medication Adjustments/Labs and Tests Ordered: Current medicines are reviewed at length with the patient today.  Concerns regarding medicines are outlined above.  Orders Placed This Encounter  Procedures   EKG 12-Lead    No orders of the defined types were placed in this encounter.      Patient Instructions  Medication Instructions:  Your physician recommends that you continue on your current medications as directed. Please refer to the Current Medication list given to you today.  *If you need a refill on your cardiac medications before your next appointment, please call your pharmacy*  Lab Work: None ordered today.  Testing/Procedures: None ordered today.  Follow-Up: At [facility], you and your health needs are our priority.  As part of our continuing mission to provide you with exceptional heart care, we have created designated Provider Care Teams.  These Care Teams include your primary Cardiologist (physician) and Advanced Practice Providers (APPs -  Physician Assistants and Nurse Practitioners) who all work together to provide you with the care you need, when you need it.  Your next appointment:   1 year(s)  The format for your next appointment:   In Person  Provider:   Jerel Balding, MD   Signed, Aleene Passe, MD  07/14/2023 9:41 AM    [facility]  Has DM - HBA1C was 7.6

## 2023-07-14 ENCOUNTER — Ambulatory Visit: Payer: Self-pay | Attending: Cardiovascular Disease | Admitting: Cardiovascular Disease

## 2023-07-14 ENCOUNTER — Encounter: Payer: Self-pay | Admitting: Cardiovascular Disease

## 2023-07-14 VITALS — BP 118/64 | HR 97 | Ht 71.5 in | Wt 304.6 lb

## 2023-07-14 DIAGNOSIS — I10 Essential (primary) hypertension: Secondary | ICD-10-CM

## 2023-07-14 DIAGNOSIS — E782 Mixed hyperlipidemia: Secondary | ICD-10-CM | POA: Diagnosis not present

## 2023-07-14 NOTE — Patient Instructions (Signed)
 Medication Instructions:  Your physician recommends that you continue on your current medications as directed. Please refer to the Current Medication list given to you today.  *If you need a refill on your cardiac medications before your next appointment, please call your pharmacy*  Lab Work: None ordered today.  Testing/Procedures: None ordered today.  Follow-Up: At [facility], you and your health needs are our priority.  As part of our continuing mission to provide you with exceptional heart care, we have created designated Provider Care Teams.  These Care Teams include your primary Cardiologist (physician) and Advanced Practice Providers (APPs -  Physician Assistants and Nurse Practitioners) who all work together to provide you with the care you need, when you need it.  Your next appointment:   1 year(s)  The format for your next appointment:   In Person  Provider:   Jerel Balding, MD

## 2023-08-07 ENCOUNTER — Emergency Department (HOSPITAL_BASED_OUTPATIENT_CLINIC_OR_DEPARTMENT_OTHER)
Admission: EM | Admit: 2023-08-07 | Discharge: 2023-08-07 | Disposition: A | Discharge status: Home / Self Care | Payer: 59 | Attending: Emergency Medicine | Admitting: Emergency Medicine

## 2023-08-07 ENCOUNTER — Encounter (HOSPITAL_BASED_OUTPATIENT_CLINIC_OR_DEPARTMENT_OTHER): Payer: Self-pay | Admitting: Urology

## 2023-08-07 ENCOUNTER — Encounter: Payer: Self-pay | Admitting: Internal Medicine

## 2023-08-07 ENCOUNTER — Other Ambulatory Visit: Payer: Self-pay

## 2023-08-07 ENCOUNTER — Emergency Department (HOSPITAL_BASED_OUTPATIENT_CLINIC_OR_DEPARTMENT_OTHER): Payer: 59

## 2023-08-07 DIAGNOSIS — I110 Hypertensive heart disease with heart failure: Secondary | ICD-10-CM | POA: Diagnosis not present

## 2023-08-07 DIAGNOSIS — R42 Dizziness and giddiness: Secondary | ICD-10-CM | POA: Insufficient documentation

## 2023-08-07 DIAGNOSIS — Z7982 Long term (current) use of aspirin: Secondary | ICD-10-CM | POA: Insufficient documentation

## 2023-08-07 DIAGNOSIS — M546 Pain in thoracic spine: Secondary | ICD-10-CM | POA: Insufficient documentation

## 2023-08-07 DIAGNOSIS — Z79899 Other long term (current) drug therapy: Secondary | ICD-10-CM | POA: Insufficient documentation

## 2023-08-07 DIAGNOSIS — Z20822 Contact with and (suspected) exposure to covid-19: Secondary | ICD-10-CM | POA: Diagnosis not present

## 2023-08-07 DIAGNOSIS — E119 Type 2 diabetes mellitus without complications: Secondary | ICD-10-CM | POA: Insufficient documentation

## 2023-08-07 DIAGNOSIS — R0602 Shortness of breath: Secondary | ICD-10-CM | POA: Insufficient documentation

## 2023-08-07 DIAGNOSIS — I2699 Other pulmonary embolism without acute cor pulmonale: Secondary | ICD-10-CM

## 2023-08-07 DIAGNOSIS — I509 Heart failure, unspecified: Secondary | ICD-10-CM | POA: Insufficient documentation

## 2023-08-07 LAB — RESP PANEL BY RT-PCR (RSV, FLU A&B, COVID)  RVPGX2
Influenza A by PCR: NEGATIVE
Influenza B by PCR: NEGATIVE
Resp Syncytial Virus by PCR: NEGATIVE
SARS Coronavirus 2 by RT PCR: NEGATIVE

## 2023-08-07 LAB — COMPREHENSIVE METABOLIC PANEL
ALT: 36 U/L (ref 0–44)
AST: 23 U/L (ref 15–41)
Albumin: 3.9 g/dL (ref 3.5–5.0)
Alkaline Phosphatase: 55 U/L (ref 38–126)
Anion gap: 9 (ref 5–15)
BUN: 16 mg/dL (ref 6–20)
CO2: 29 mmol/L (ref 22–32)
Calcium: 8.6 mg/dL — ABNORMAL LOW (ref 8.9–10.3)
Chloride: 100 mmol/L (ref 98–111)
Creatinine, Ser: 1.18 mg/dL (ref 0.61–1.24)
GFR, Estimated: >60 mL/min (ref >60)
Glucose, Bld: 65 mg/dL — ABNORMAL LOW (ref 70–99)
Potassium: 3.8 mmol/L (ref 3.5–5.1)
Sodium: 138 mmol/L (ref 135–145)
Total Bilirubin: 0.9 mg/dL (ref 0.0–1.2)
Total Protein: 7.3 g/dL (ref 6.5–8.1)

## 2023-08-07 LAB — CBC WITH DIFFERENTIAL/PLATELET
Abs Immature Granulocytes: 0.03 10*3/uL (ref 0.00–0.07)
Basophils Absolute: 0 10*3/uL (ref 0.0–0.1)
Basophils Relative: 0 %
Eosinophils Absolute: 0.2 10*3/uL (ref 0.0–0.5)
Eosinophils Relative: 2 %
HCT: 41.7 % (ref 39.0–52.0)
Hemoglobin: 13.4 g/dL (ref 13.0–17.0)
Immature Granulocytes: 0 %
Lymphocytes Relative: 29 %
Lymphs Abs: 2.8 10*3/uL (ref 0.7–4.0)
MCH: 30 pg (ref 26.0–34.0)
MCHC: 32.1 g/dL (ref 30.0–36.0)
MCV: 93.3 fL (ref 80.0–100.0)
Monocytes Absolute: 0.8 10*3/uL (ref 0.1–1.0)
Monocytes Relative: 9 %
Neutro Abs: 5.7 10*3/uL (ref 1.7–7.7)
Neutrophils Relative %: 60 %
Platelets: 277 10*3/uL (ref 150–400)
RBC: 4.47 MIL/uL (ref 4.22–5.81)
RDW: 13.2 % (ref 11.5–15.5)
WBC: 9.6 10*3/uL (ref 4.0–10.5)
nRBC: 0 % (ref 0.0–0.2)

## 2023-08-07 LAB — D-DIMER, QUANTITATIVE: D-Dimer, Quant: <0.27 ug{FEU}/mL (ref 0.00–0.50)

## 2023-08-07 MED ORDER — ALBUTEROL SULFATE HFA 108 (90 BASE) MCG/ACT IN AERS: 2.0000 | INHALATION_SPRAY | RESPIRATORY_TRACT | Status: DC | PRN

## 2023-08-07 MED ORDER — SODIUM CHLORIDE 0.9 % IV BOLUS
500.0000 mL | Freq: Once | INTRAVENOUS | Status: AC
  Administered 2023-08-07: 500 mL via INTRAVENOUS

## 2023-08-07 NOTE — ED Provider Notes (Signed)
[place] EMERGENCY DEPARTMENT AT [facility] Provider Note   CSN: [id number] Arrival date & time: 08/07/23  1443     History  Chief Complaint  Patient presents with   Shortness of Breath    Vernon Reed is a 48 y.o. male.  Patient with history of CHF, HTN, HLD, T2DM, GERD presents after episode this morning that started as lightheadedness/dizziness while doing light activity. He went to his PCP's office. Found to be have high heartrate and low oxygen level. He also reported he had been having some right and left intermittent thoracic back pain for the last several days without known injury. He was sent here for concern for PE. No history of clots. He is not anticoagulated. Nonsmoker. He did travel to [place], [place], recently on a nonstop 4-hour flight.   The history is provided by the patient and the spouse. No language interpreter was used.  Shortness of Breath      Home Medications Prior to Admission medications   Medication Sig Start Date End Date Taking? Authorizing Provider  aspirin 81 MG EC tablet Take 1 tablet (81 mg total) by mouth daily. 07/16/15   Laurey Morale, MD  atorvastatin (LIPITOR) 10 MG tablet Take 1 tablet (10 mg total) by mouth daily. 04/02/23   Nafziger, Kandee Keen, NP  Blood Glucose Monitoring Suppl (TRUE METRIX METER) W/DEVICE KIT 1 each by Does not apply route once as needed. 12/11/14   Hoy Register, MD  carvedilol (COREG) 12.5 MG tablet Take 1.5 tablets (18.75 mg total) by mouth 2 (two) times daily with a meal. 09/19/22   Nahser, Deloris Ping, MD  cyclobenzaprine (FLEXERIL) 10 MG tablet Take 1 tablet (10 mg total) by mouth 3 (three) times daily as needed for muscle spasms. 06/24/23   Worthy Rancher B, FNP  furosemide (LASIX) 40 MG tablet TAKE 1 TABLET BY MOUTH EVERY DAY AS NEEDED 06/29/23   Nahser, Deloris Ping, MD  glucose blood (TRUE METRIX BLOOD GLUCOSE TEST) test strip Used 3 times daily before meals. 04/08/16   Hoy Register, MD  hydrALAZINE (APRESOLINE)  50 MG tablet TAKE 1 TABLET BY MOUTH THREE TIMES A DAY 12/15/22   Nahser, Deloris Ping, MD  isosorbide mononitrate (IMDUR) 30 MG 24 hr tablet Take 1 tablet (30 mg total) by mouth daily. 04/22/23   Nahser, Deloris Ping, MD  potassium chloride (KLOR-CON) 10 MEQ tablet TAKE 1 TABLET BY MOUTH EVERY DAY 01/27/23   Nafziger, Kandee Keen, NP  predniSONE (DELTASONE) 20 MG tablet Take 2 tablets (40 mg total) by mouth daily with breakfast. 06/24/23   Worthy Rancher B, FNP  sacubitril-valsartan (ENTRESTO) 97-103 MG TAKE 1 TABLET BY MOUTH TWICE A DAY 06/25/23   Nahser, Deloris Ping, MD  sildenafil (VIAGRA) 100 MG tablet Take 1 tablet (100 mg total) by mouth daily as needed for erectile dysfunction. 01/26/23   Nelwyn Salisbury, MD  spironolactone (ALDACTONE) 25 MG tablet TAKE 1 TABLET BY MOUTH EVERY DAY 05/15/23   Nahser, Deloris Ping, MD  TRUEPLUS LANCETS 30G MISC 1 each by Does not apply route every morning. 12/11/14   Hoy Register, MD      Allergies    Patient has no known allergies.    Review of Systems   Review of Systems  Respiratory:  Positive for shortness of breath.     Physical Exam Updated Vital Signs BP 123/87 (BP Location: Right Arm)   Pulse 81   Temp 98.3 F (36.8 C)   Resp 18   Ht 6' (1.829  m)   Wt (!) 138.2 kg   SpO2 92%   BMI 41.32 kg/m  Physical Exam Vitals and nursing note reviewed.  Constitutional:      Appearance: He is well-developed.  HENT:     Head: Normocephalic.  Neck:     Vascular: No carotid bruit.  Cardiovascular:     Rate and Rhythm: Normal rate and regular rhythm.     Heart sounds: No murmur heard. Pulmonary:     Effort: Pulmonary effort is normal.     Breath sounds: Normal breath sounds. No wheezing, rhonchi or rales.  Abdominal:     Palpations: Abdomen is soft.  Musculoskeletal:        General: Normal range of motion.     Cervical back: Normal range of motion and neck supple.     Right lower leg: No tenderness. No edema.     Left lower leg: No tenderness. No edema.  Skin:     General: Skin is warm and dry.  Neurological:     General: No focal deficit present.     Mental Status: He is alert and oriented to person, place, and time.     ED Results / Procedures / Treatments   Labs (all labs ordered are listed, but only abnormal results are displayed) Labs Reviewed  COMPREHENSIVE METABOLIC PANEL - Abnormal; Notable for the following components:      Result Value   Glucose, Bld 65 (*)    Calcium 8.6 (*)    All other components within normal limits  RESP PANEL BY RT-PCR (RSV, FLU A&B, COVID)  [id number]  CBC WITH DIFFERENTIAL/PLATELET  D-DIMER, QUANTITATIVE    EKG None  Radiology DG Chest 2 View Result Date: 08/07/2023 CLINICAL DATA:  Shortness of breath EXAM: CHEST - 2 VIEW COMPARISON:  X-ray 12/06/2014 FINDINGS: No consolidation, pneumothorax or effusion. No edema. Normal cardiopericardial silhouette. IMPRESSION: No acute cardiopulmonary disease. Electronically Signed   By: Karen Kays M.D.   On: 08/07/2023 15:59    Procedures Procedures    Medications Ordered in ED Medications  albuterol (VENTOLIN HFA) 108 (90 Base) MCG/ACT inhaler 2 puff (has no administration in time range)  sodium chloride 0.9 % bolus 500 mL (500 mLs Intravenous New Bag/Given 08/07/23 1808)    ED Course/ Medical Decision Making/ A&P                                 Medical Decision Making Patient with episode dizziness today. Was seen by his PCP, found to be low oxygen and elevated HR, per wife, and sent for evaluation of PE. He is feeling better in terms of dizziness. No SOB or chest pain. No recent illness. No h/o clots  Labs: No WBC, normal hgb. No electrolyte derangement, normal renal function. D-dimer negative at <0.27. CXR negative, no edema, infiltrates, consolidations as interpreted by radiology  Patient's VS on arrival to ED WNL - no tachycardia or hypoxia. Neg d-dimer. No evidence pulmonary edema or infectious process. Patient is feeling better.  During ED encounter,  O2 saturation drops to 90%. Patient continues to deny SOB or chest pain. Discussed negative work up and offered shared decision making on whether to obtain CTA r/o PE study. Patient and spouse decline PE study for now. Will discharge to home with strict return precautions.  O2 saturation on my recheck - 93%, normal heart rate.  Amount and/or Complexity of Data Reviewed Labs: ordered. Radiology: ordered.  Risk Prescription drug management.           Final Clinical Impression(s) / ED Diagnoses Final diagnoses:  Dizziness    Rx / DC Orders ED Discharge Orders     None         Danne Harbor 08/07/23 1847    Jacalyn Lefevre, MD 08/07/23 2352

## 2023-08-07 NOTE — Discharge Instructions (Signed)
As we decided together, a CTA was not done on this visit. Labs, EKG, chest xray all negative. While reassuring, this does not explain the dizziness you experienced earlier. Please have a low threshold for returning to the ED with any new or worsening symptoms.

## 2023-08-07 NOTE — ED Triage Notes (Signed)
Pt sent from PCP for CTA to R/o PE.   States had some dizziness this am and was seen and noticed some EKG changes and some Shortness of breath and low SPO2 in office  Slight left side back pain   NAD distress noted

## 2023-08-23 ENCOUNTER — Other Ambulatory Visit: Payer: Self-pay | Admitting: Cardiovascular Disease

## 2023-08-25 ENCOUNTER — Other Ambulatory Visit: Payer: Self-pay | Admitting: Cardiovascular Disease

## 2023-08-28 ENCOUNTER — Other Ambulatory Visit (HOSPITAL_COMMUNITY): Payer: Self-pay

## 2023-08-28 ENCOUNTER — Telehealth: Payer: Self-pay | Admitting: Pharmacy Technician

## 2023-08-28 NOTE — Telephone Encounter (Signed)
 Pharmacy Patient Advocate Encounter   Received notification from Patient Advice Request messages that prior authorization for Hydralazine is required/requested.   Insurance verification completed.   The patient is insured through [facility] .   Per test claim: The current 08/28/23 day co-pay is, $23.03- 3 months.  No PA needed at this time. This test claim was processed through [facility]- copay amounts may vary at other pharmacies due to pharmacy/plan contracts, or as the patient moves through the different stages of their insurance plan.     Must use NDC- [id number]

## 2023-09-03 ENCOUNTER — Institutional Professional Consult (permissible substitution): Payer: 59 | Admitting: Neurology

## 2023-09-07 ENCOUNTER — Institutional Professional Consult (permissible substitution): Payer: 59 | Admitting: Neurology

## 2023-09-21 ENCOUNTER — Ambulatory Visit: Admitting: Neurology

## 2023-09-21 ENCOUNTER — Encounter: Payer: Self-pay | Admitting: Neurology

## 2023-09-21 VITALS — BP 138/82 | HR 80

## 2023-09-21 DIAGNOSIS — R0683 Snoring: Secondary | ICD-10-CM | POA: Diagnosis not present

## 2023-09-21 DIAGNOSIS — Z6841 Body Mass Index (BMI) 40.0 and over, adult: Secondary | ICD-10-CM

## 2023-09-21 DIAGNOSIS — I420 Dilated cardiomyopathy: Secondary | ICD-10-CM

## 2023-09-21 DIAGNOSIS — G478 Other sleep disorders: Secondary | ICD-10-CM

## 2023-09-21 DIAGNOSIS — I495 Sick sinus syndrome: Secondary | ICD-10-CM

## 2023-09-21 DIAGNOSIS — E66813 Obesity, class 3: Secondary | ICD-10-CM

## 2023-09-21 DIAGNOSIS — G4733 Obstructive sleep apnea (adult) (pediatric): Secondary | ICD-10-CM | POA: Diagnosis not present

## 2023-09-21 NOTE — Patient Instructions (Signed)
 48 y.o. year old male  here with:   Cardiomyopathy and brady tachycardia, diastolic heart failure.  OSA dx in 2015, CPAP therapy  was discontinued  by patient after 2 years.   ONO at home 93% average,      1) History of sleep apnea, severe OSA in 2015 and  SPLIT study confirmed ( Dr Vassie Loll) underlying conditions have not improved, he has similar weight, air way anatomy but is older now.   CHF.  Obesity.    He reports dry mouth in AM, snoring, no witnessed apneas recently. He has woken up gasping, likely a manifestation of apnea and his ESS score is 10.    2) I will repeat a sleep study, but this time as a HST with [facility].  (  He sleeps well in hotels and wouldn't mind in lab SPLIT study )     Weight loss may by assisted by Ozempic, he lost 15 pounds in 6 months.      I plan to follow up through our NP within 3-5 months.

## 2023-09-21 NOTE — Progress Notes (Signed)
 SLEEP MEDICINE CLINIC    Provider:  Melvyn Novas, MD  Primary Care Physician:  Shirline Frees, NP [address] [place] [address]     Referring Provider: Patrecia Pour, Md [address] [place],  [place] [address]          Chief Complaint according to patient   Patient presents with:     New Patient (Initial Visit) arriving only 3 minutes before appointment with MD            HISTORY OF PRESENT ILLNESS:  Vernon Reed. Plain is a 48 y.o. 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  Chief concern according to patient :  " I was tested a decade ago, put on CPAP and then lost an adapter,  d/c  the CPAP since  8 years ago, and kept the same weight. My sleep is some nights restless, I have sometimes sinus problems, wake with a dry mouth."         The patient had the first sleep study ion 03-01-2024,  with a result of an AHI ( Apnea Hypopnea index)  of 32.9/h and was split, titrated to 10 cm water, under an [employer] FFM in medium, SIMPLUS model.  REM sleep rebounded , high sleep efficiency,  he didn't have Facial hair at the time - unlike now.  ,   Sleep relevant medical history: Nocturia 0-1, NO ENT surgeries such as  Tonsillectomy,  no dental procedures, kept wisdom teeth, No  cervical spine surgery, no TBI.  DM2 , on ozempic. HBa1c 6.0.     Family medical /sleep history: father  undiagnosed but likely having OSA, HTN, Obesity.     Social history:  Patient is working as  Paramedic job, Animator based,  with client contact  and lives in a household with room mate , no pets. Never used to work in shifts( night/ rotating,) Tobacco use; none .  ETOH use ; socially, , Caffeine intake in form of Coffee( /) Soda( 1 bottle every other day, mountain dew) , Tea ( when eating out ) no energy drinks Exercise : no routine.   Band - plays every music style.         Sleep habits are as follows: The patient's dinner time is between  5-9 PM. The patient goes to bed at 12 PM and continues to sleep for 4-6 hours, wakes for one bathroom breaks, the first time at 2-3 AM.   The preferred sleep position is supine , with the support of one  pillow.  Dreams are reportedly rare.    The patient wakes up spontaneously before any alarm. 6.30-7   AM is the usual rise time. He reports not feeling refreshed or restored in AM, with symptoms such as dry mouth,  no morning headaches, and reports residual fatigue. Naps are taken infrequently, I like naps, but have no opportunity.  lasting from 20 to 30 minutes and are no more refreshing .    Review of Systems: Out of a complete 14 system review, the patient complains of only the following symptoms, and all other reviewed systems are negative.:  Fatigue, sleepiness , snoring, fragmented sleep   How likely are you to doze in the following situations: 0 = not likely, 1 = slight chance, 2 = moderate chance, 3 = high chance   Sitting  and Reading? Watching Television? Sitting inactive in a public place (theater or meeting)? As a passenger in a car for an hour without a break? Lying down in the afternoon when circumstances permit? Sitting and talking to someone? Sitting quietly after lunch without alcohol? In a car, while stopped for a few minutes in traffic?   Total = 10/ 24 points   FSS endorsed at 27/ 63 points.   Social History   Socioeconomic History   Marital status: Divorced    Spouse name: Not on file   Number of children: 3  children : 26.  20 and 11 .    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       Social Drivers of Corporate investment banker Strain: Low Risk  (03/31/2023)   Overall Financial Resource Strain (CARDIA)    Difficulty of Paying Living Expenses: Not very hard  Food Insecurity: No Food Insecurity (03/31/2023)   Hunger Vital Sign    Worried About Running Out of Food in the Last Year: Never true    Ran Out of Food in the Last Year: Never true  Transportation Needs: No Transportation Needs (03/31/2023)   PRAPARE - Administrator, Civil Service (Medical): No    Lack of Transportation (Non-Medical): No  Physical Activity: Insufficiently Active (03/31/2023)   Exercise Vital Sign    Days of Exercise per Week: 1 day    Minutes of Exercise per Session: 10 min  Stress: No Stress Concern Present (03/31/2023)   [employer] of Occupational Health - Occupational Stress Questionnaire    Feeling of Stress : Not at all  Social Connections: Moderately Integrated (03/31/2023)   Social Connection and Isolation Panel [NHANES]    Frequency of Communication with Friends and Family: More than three times a week    Frequency of Social Gatherings with Friends and Family: Twice a week    Attends Religious Services: More than 4 times per year    Active Member of Clubs or Organizations: Yes    Attends Engineer, structural: More than 4 times per year    Marital Status: Divorced    Family History  Problem Relation Age of Onset   Hypertension Mother    Angioedema Mother    Hypertension Father    Sleep apnea Father     Past Medical History:  Diagnosis Date   AKI (acute kidney injury) (HCC) 07/27/2013   Cardiomyopathy (HCC) 07/25/2013   CHF (congestive heart failure) (HCC)    Chicken pox    Chronic systolic heart failure (HCC) 01/04/2014   GERD (gastroesophageal reflux disease)    Headache     "maybe 5 times/yr; nothing regular" (12/06/2014)   Heart block 07/25/2013   Hyperlipemia    Hypertension    Hypokalemia 07/26/2013   Hypoxia 12/06/2014   Obesity, morbid (HCC) 12/27/2013   OSA (obstructive sleep apnea)    Sleep apnea    SOB (shortness  of breath) 07/23/2013   Tachy-brady syndrome (HCC) 07/26/2013   Tachycardia 07/23/2013   Type II diabetes mellitus (HCC)     Past Surgical History:  Procedure Laterality Date   ABDOMINAL SURGERY  ~ 1987   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   APPENDECTOMY  ~ 1989   CARDIAC CATHETERIZATION  07/2013   LEFT AND RIGHT HEART CATHETERIZATION WITH CORONARY ANGIOGRAM N/A 07/26/2013   Procedure: LEFT AND RIGHT HEART CATHETERIZATION WITH CORONARY ANGIOGRAM;  Surgeon: Marykay Lex, MD;  Location: [facility] CATH LAB;  Service: Cardiovascular;  Laterality: N/A;     Current Outpatient Medications on File Prior to Visit  Medication Sig Dispense Refill   aspirin 81 MG EC tablet Take 1 tablet (81 mg total) by mouth daily. 30 tablet 6   atorvastatin (LIPITOR) 10 MG tablet Take 1 tablet (10 mg total) by mouth daily. 90 tablet 1   Blood Glucose Monitoring Suppl (TRUE METRIX METER) W/DEVICE KIT 1 each by Does not apply route once as needed. 1 kit 0   carvedilol (COREG) 12.5 MG tablet Take 1.5 tablets (18.75 mg total) by mouth 2 (two) times daily with a meal. 270 tablet 2   cyclobenzaprine (FLEXERIL) 10 MG tablet Take 1 tablet (10 mg total) by mouth 3 (three) times daily as needed for muscle spasms. 30 tablet 0   furosemide (LASIX) 40 MG tablet TAKE 1 TABLET BY MOUTH EVERY DAY AS NEEDED 90 tablet 0   glucose blood (TRUE METRIX BLOOD GLUCOSE TEST) test strip Used 3 times daily before meals. 100 each 12   hydrALAZINE (APRESOLINE) 50 MG tablet TAKE 1 TABLET BY MOUTH THREE TIMES A DAY 270 tablet 3   isosorbide mononitrate (IMDUR) 30 MG 24 hr tablet Take 1 tablet (30 mg total) by mouth daily. 90 tablet 1   potassium chloride (KLOR-CON) 10 MEQ tablet TAKE 1 TABLET BY MOUTH  EVERY DAY 90 tablet 1   sacubitril-valsartan (ENTRESTO) 97-103 MG TAKE 1 TABLET BY MOUTH TWICE A DAY 90 tablet 0   sildenafil (VIAGRA) 100 MG tablet Take 1 tablet (100 mg total) by mouth daily as needed for erectile dysfunction. 10 tablet 0   spironolactone (ALDACTONE) 25 MG tablet TAKE 1 TABLET BY MOUTH EVERY DAY 90 tablet 1   TRUEPLUS LANCETS 30G MISC 1 each by Does not apply route every morning. 50 each 11   No current facility-administered medications on file prior to visit.    No Known Allergies   DIAGNOSTIC DATA (LABS, IMAGING, TESTING) - I reviewed patient records, labs, notes, testing and imaging myself where available.  Lab Results  Component Value Date   WBC 9.6 08/07/2023   HGB 13.4 08/07/2023   HCT 41.7 08/07/2023   MCV 93.3 08/07/2023   PLT 277 08/07/2023      Component Value Date/Time   NA 138 08/07/2023 1457   NA 142 01/03/2022 1422   K 3.8 08/07/2023 1457   CL 100 08/07/2023 1457   CO2 29 08/07/2023 1457   GLUCOSE 65 (L) 08/07/2023 1457   BUN 16 08/07/2023 1457   BUN 13 01/03/2022 1422   CREATININE 1.18 08/07/2023 1457   CREATININE 1.16 02/02/2020 0836   CALCIUM 8.6 (L) 08/07/2023 1457   PROT 7.3 08/07/2023 1457   ALBUMIN 3.9 08/07/2023 1457   AST 23 08/07/2023 1457   ALT 36 08/07/2023 1457   ALKPHOS 55 08/07/2023 1457   BILITOT 0.9 08/07/2023 1457   GFRNONAA >60 08/07/2023 1457   GFRNONAA 76 02/02/2020 0836   GFRAA  88 02/02/2020 0836   Lab Results  Component Value Date   CHOL 166 04/01/2023   HDL 29.40 (L) 04/01/2023   LDLCALC 116 (H) 04/01/2023   TRIG 102.0 04/01/2023   CHOLHDL 6 04/01/2023   Lab Results  Component Value Date   HGBA1C 6.0 04/01/2023   No results found for: "VITAMINB12" Lab Results  Component Value Date   TSH 2.56 04/01/2023    PHYSICAL EXAM:  Today's Vitals   09/21/23 1009  BP: 138/82  Pulse: 80   There is no height or weight on file to calculate BMI.   Wt Readings from Last 3 Encounters:  08/07/23 (!) 304 lb  10.8 oz (138.2 kg)  07/14/23 (!) 304 lb 9.6 oz (138.2 kg)  04/01/23 (!) 306 lb (138.8 kg)     Ht Readings from Last 3 Encounters:  08/07/23 6' (1.829 m)  07/14/23 5' 11.5" (1.816 m)  04/01/23 5' 11.25" (1.81 m)      General: The patient is awake, alert and appears not in acute distress. The patient is groomed. Facial hair is noted.  Head: Normocephalic, atraumatic. Neck is supple.  Mallampati 4 ( 3 plus ) macroglassia, scalloped tongue. ,  neck circumference:18 inches . Nasal airflow not fully patent.   Retrognathia is  seen.  Dental status:  crowded.  Cardiovascular:  Regular rate and cardiac rhythm by pulse,  without distended neck veins. Respiratory: Lungs are clear to auscultation.  Skin:  Without evidence of ankle edema, or rash. Trunk: The patient's posture is relaxed.    NEUROLOGIC EXAM: The patient is awake and alert, oriented to place and time.   Memory subjective described as intact.  Attention span & concentration ability appears normal.  Speech is fluent,  without  dysarthria, dysphonia or aphasia.  Mood and affect are appropriate.   Cranial nerves: no loss of smell or taste reported  Pupils are equal and briskly reactive to light. Funduscopic exam deferred.  Extraocular movements in vertical and horizontal planes were intact and without nystagmus. No Diplopia. Visual fields by finger perimetry are intact. Hearing was intact to soft voice and finger rubbing.  Facial sensation intact to fine touch. Facial motor strength is symmetric and tongue and uvula move midline.  Neck ROM : rotation, tilt and flexion extension were normal for age and shoulder shrug was symmetrical.    Motor exam:  Symmetric bulk, tone and ROM.   Normal tone without cog-wheeling, symmetric grip strength . Right grip is weaker, reports arthritic pain.    Sensory:  Fine touch and vibration were normal.  Proprioception tested in the upper extremities was normal.   Coordination: Rapid alternating  movements in the fingers/hands were of normal speed.  The Finger-to-nose maneuver was intact without evidence of ataxia, dysmetria or tremor.   Gait and station: Patient could rise unassisted from a seated position, walked without assistive device.  Stance is of  wider base and the patient turned with 3 steps.  Toe and heel walk were deferred.  Deep tendon reflexes: in the  upper and lower extremities are symmetric and intact.      ASSESSMENT AND PLAN 48 y.o. year old male  here with:  Cardiomyopathy and brady tachycardia, diastolic heart failure.  OSA dx in 2015, CPAP therapy  was discontinued  by patient after 2 years.   ONO at home 93% average,     1) History of sleep apnea, severe OSA in 2015 and  SPLIT study confirmed ( Dr Vassie Loll) underlying conditions have not  improved, he has similar weight, air way anatomy but is older now.   CHF.  Obesity.    He reports dry mouth in AM, snoring, no witnessed apneas recently. He has woken up gasping, likely a manifestation of apnea and his ESS score is 10.   2) I will repeat a sleep study, but this time as a HST with [facility].  (  He sleeps well in hotels and wouldn't mind in lab SPLIT study )   Weight loss may by assisted by Ozempic, he lost 15 pounds in 6 months.    I plan to follow up through our NP within 3-5 months.   I would like to thank Shirline Frees, NP and [place], Lovell Sheehan, Md [address] [place],  [place] [address] for allowing me to meet with and to take care of this pleasant patient.     After spending a total time of  45  minutes face to face and additional time for physical and neurologic examination, review of laboratory studies,  personal review of imaging studies, reports and results of other testing and review of referral information / records as far as provided in visit,   Electronically signed by: Melvyn Novas, MD 09/21/2023 10:11 AM  [facility] and [facility] Board  certified by The [employer] of Sleep Medicine and Diplomate of the [employer] of Sleep Medicine. Board certified In Neurology through the ABPN, Fellow of the [employer] of Neurology.

## 2023-09-25 ENCOUNTER — Encounter: Payer: Self-pay | Admitting: Adult Health

## 2023-09-29 ENCOUNTER — Ambulatory Visit: Payer: BC Managed Care – PPO | Admitting: Adult Health

## 2023-09-29 ENCOUNTER — Ambulatory Visit: Admitting: Neurology

## 2023-09-29 VITALS — BP 100/82 | HR 89 | Temp 98.1°F | Ht 72.0 in | Wt 306.0 lb

## 2023-09-29 DIAGNOSIS — Z1211 Encounter for screening for malignant neoplasm of colon: Secondary | ICD-10-CM

## 2023-09-29 DIAGNOSIS — G4733 Obstructive sleep apnea (adult) (pediatric): Secondary | ICD-10-CM

## 2023-09-29 DIAGNOSIS — Z7985 Long-term (current) use of injectable non-insulin antidiabetic drugs: Secondary | ICD-10-CM

## 2023-09-29 DIAGNOSIS — I10 Essential (primary) hypertension: Secondary | ICD-10-CM

## 2023-09-29 DIAGNOSIS — R0683 Snoring: Secondary | ICD-10-CM

## 2023-09-29 DIAGNOSIS — Z113 Encounter for screening for infections with a predominantly sexual mode of transmission: Secondary | ICD-10-CM

## 2023-09-29 DIAGNOSIS — G478 Other sleep disorders: Secondary | ICD-10-CM

## 2023-09-29 DIAGNOSIS — E66813 Obesity, class 3: Secondary | ICD-10-CM

## 2023-09-29 LAB — POCT GLYCOSYLATED HEMOGLOBIN (HGB A1C): Hemoglobin A1C: 5.6 % (ref 4.0–5.6)

## 2023-09-29 MED ORDER — SEMAGLUTIDE (2 MG/DOSE) 8 MG/3ML ~~LOC~~ SOPN: 2.0000 mg | PEN_INJECTOR | SUBCUTANEOUS | 1 refills | Status: AC

## 2023-09-29 NOTE — Progress Notes (Signed)
 Subjective:    Patient ID: Deyvi Bonanno, male    DOB: 1976-04-17, 48 y.o.   MRN: [id number]  HPI 48 year old male who  has a past medical history of AKI (acute kidney injury) (HCC) (07/27/2013), Cardiomyopathy (HCC) (07/25/2013), CHF (congestive heart failure) (HCC), Chicken pox, Chronic systolic heart failure (HCC) (4/0/9811), GERD (gastroesophageal reflux disease), Headache, Heart block (07/25/2013), Hyperlipemia, Hypertension, Hypokalemia (07/26/2013), Hypoxia (12/06/2014), Obesity, morbid (HCC) (12/27/2013), OSA (obstructive sleep apnea), Sleep apnea, SOB (shortness of breath) (07/23/2013), Tachy-brady syndrome (HCC) (07/26/2013), Tachycardia (07/23/2013), and Type II diabetes mellitus (HCC).  He presents to the office today for follow up regarding DM/HTN/Obesity  Diabetes mellitus type 2-he was maintained on Ozempic 2 mg  and was tolerating this medication well.  He checks his blood sugar every other day and reports readings in the 90-low 100's.  Lab Results  Component Value Date   HGBA1C 5.6 09/29/2023   HGBA1C 6.0 04/01/2023   HGBA1C 6.2 03/27/2022   HTN -managed with Coreg 12.5 mg - 1.5 tablets BID, Lasix 40 mg daily, hydralazine 50 mg 3 times daily ( he has not taken Hydralazine in " a long time",  isosorbide 30 mg daily, metoprolol 100 mg twice daily, spirloactone 25 mg daily and Entresto 97-103 mg twice daily BP Readings from Last 3 Encounters:  09/29/23 100/82  09/21/23 138/82  08/07/23 123/87   Obesity - he has not been exercising but has been eating better.  Wt Readings from Last 3 Encounters:  09/29/23 (!) 306 lb (138.8 kg)  08/07/23 (!) 304 lb 10.8 oz (138.2 kg)  07/14/23 (!) 304 lb 9.6 oz (138.2 kg)    STD screening - he denies symptoms or concerns. She just wants to make sure.   Review of Systems See HPI   Past Medical History:  Diagnosis Date   AKI (acute kidney injury) (HCC) 07/27/2013   Cardiomyopathy (HCC) 07/25/2013   CHF (congestive heart failure) (HCC)     Chicken pox    Chronic systolic heart failure (HCC) 01/04/2014   GERD (gastroesophageal reflux disease)    Headache    "maybe 5 times/yr; nothing regular" (12/06/2014)   Heart block 07/25/2013   Hyperlipemia    Hypertension    Hypokalemia 07/26/2013   Hypoxia 12/06/2014   Obesity, morbid (HCC) 12/27/2013   OSA (obstructive sleep apnea)    Sleep apnea    SOB (shortness of breath) 07/23/2013   Tachy-brady syndrome (HCC) 07/26/2013   Tachycardia 07/23/2013   Type II diabetes mellitus (HCC)     Social History   Socioeconomic History   Marital status: Divorced    Spouse name: Not on file   Number of children: 3   Years of education: Not on file   Highest education level: Bachelor's degree (e.g., BA, AB, BS)  Occupational History   Occupation: Teaching laboratory technician: [employer]  Tobacco Use   Smoking status: Never   Smokeless tobacco: Never  Vaping Use   Vaping status: Never Used  Substance and Sexual Activity   Alcohol use: No    Comment: rare   Drug use: No   Sexual activity: Yes  Other Topics Concern   Not on file  Social History Narrative   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       Social Drivers of Corporate investment banker Strain: Low Risk  (03/31/2023)   Overall Financial Resource Strain (CARDIA)    Difficulty of Paying Living Expenses: Not very hard  Food Insecurity: No Food Insecurity (03/31/2023)   Hunger Vital Sign    Worried About Running Out of Food in the Last Year: Never true    Ran Out of Food in the Last Year: Never true  Transportation Needs: No Transportation Needs (03/31/2023)   PRAPARE - Administrator, Civil Service (Medical): No    Lack of Transportation (Non-Medical): No  Physical Activity: Insufficiently Active (03/31/2023)   Exercise Vital Sign    Days of Exercise per Week: 1 day    Minutes of Exercise per Session: 10 min  Stress: No Stress Concern Present (03/31/2023)   [employer] of Occupational Health  - Occupational Stress Questionnaire    Feeling of Stress : Not at all  Social Connections: Moderately Integrated (03/31/2023)   Social Connection and Isolation Panel [NHANES]    Frequency of Communication with Friends and Family: More than three times a week    Frequency of Social Gatherings with Friends and Family: Twice a week    Attends Religious Services: More than 4 times per year    Active Member of [employer] or Organizations: Yes    Attends Engineer, structural: More than 4 times per year    Marital Status: Divorced  Intimate Partner Violence: Unknown (10/07/2021)   Received from [employer]   HITS    Physically Hurt: Not on file    Insult or Talk Down To: Not on file    Threaten Physical Harm: Not on file    Scream or Curse: Not on file    Past Surgical History:  Procedure Laterality Date   ABDOMINAL SURGERY  ~ 1987   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   APPENDECTOMY  ~ 1989   CARDIAC CATHETERIZATION  07/2013   LEFT AND RIGHT HEART CATHETERIZATION WITH CORONARY ANGIOGRAM N/A 07/26/2013   Procedure: LEFT AND RIGHT HEART CATHETERIZATION WITH CORONARY ANGIOGRAM;  Surgeon: Marykay Lex, MD;  Location: [facility] CATH LAB;  Service: Cardiovascular;  Laterality: N/A;    Family History  Problem Relation Age of Onset   Hypertension Mother    Angioedema Mother    Hypertension Father    Sleep apnea Father     No Known Allergies  Current Outpatient Medications on File Prior to Visit  Medication Sig Dispense Refill   aspirin 81 MG EC tablet Take 1 tablet (81 mg total) by mouth daily. 30 tablet 6   atorvastatin (LIPITOR) 10 MG tablet Take 1 tablet (10 mg total) by mouth daily. 90 tablet 1   Blood Glucose Monitoring Suppl (TRUE METRIX METER) W/DEVICE KIT 1 each by Does not apply route once as needed. 1 kit 0   carvedilol (COREG) 12.5 MG tablet Take 1.5 tablets (18.75 mg total) by mouth 2 (two) times daily with a meal. 270 tablet 2   furosemide (LASIX) 40 MG  tablet TAKE 1 TABLET BY MOUTH EVERY DAY AS NEEDED 90 tablet 0   glucose blood (TRUE METRIX BLOOD GLUCOSE TEST) test strip Used 3 times daily before meals. 100 each 12   hydrALAZINE (APRESOLINE) 50 MG tablet TAKE 1 TABLET BY MOUTH THREE TIMES A DAY 270 tablet 3   isosorbide mononitrate (IMDUR) 30 MG 24 hr tablet Take 1 tablet (30 mg total) by mouth daily. 90 tablet 1   potassium chloride (KLOR-CON) 10 MEQ tablet TAKE 1 TABLET BY MOUTH EVERY DAY 90 tablet 1   sacubitril-valsartan (ENTRESTO) 97-103 MG TAKE 1 TABLET BY MOUTH TWICE A DAY 90 tablet 0  spironolactone (ALDACTONE) 25 MG tablet TAKE 1 TABLET BY MOUTH EVERY DAY 90 tablet 1   TRUEPLUS LANCETS 30G MISC 1 each by Does not apply route every morning. 50 each 11   No current facility-administered medications on file prior to visit.    BP 100/82   Pulse 89   Temp 98.1 F (36.7 C) (Oral)   Ht 6' (1.829 m)   Wt (!) 306 lb (138.8 kg)   SpO2 97%   BMI 41.50 kg/m       Objective:   Physical Exam Vitals and nursing note reviewed.  Constitutional:      Appearance: Normal appearance. He is obese.  Cardiovascular:     Rate and Rhythm: Normal rate and regular rhythm.     Pulses: Normal pulses.     Heart sounds: Normal heart sounds.  Pulmonary:     Effort: Pulmonary effort is normal.     Breath sounds: Normal breath sounds.  Skin:    General: Skin is warm and dry.  Neurological:     General: No focal deficit present.     Mental Status: He is alert and oriented to person, place, and time.  Psychiatric:        Mood and Affect: Mood normal.        Behavior: Behavior normal.        Thought Content: Thought content normal.        Judgment: Judgment normal.        Assessment & Plan:   1. Long-term current use of injectable noninsulin antidiabetic medication (Primary)  - POC HgB A1c- 5.6 - at goal.  - Semaglutide, 2 MG/DOSE, 8 MG/3ML SOPN; Inject 2 mg as directed once a week.  Dispense: 9 mL; Refill: 1  2. Essential  hypertension - Will d/c Hydralazine from his list. BP stable without it.   3. Colon cancer screening  - Ambulatory referral to Gastroenterology  4. Obesity, morbid (HCC) - Needs to start exercising.  - Semaglutide, 2 MG/DOSE, 8 MG/3ML SOPN; Inject 2 mg as directed once a week.  Dispense: 9 mL; Refill: 1  5. Screening for STD (sexually transmitted disease)  - Urine cytology ancillary only; Future - RPR; Future - HIV Antibody (routine testing w rflx); Future   Shirline Frees, NP

## 2023-09-29 NOTE — Patient Instructions (Signed)
 Health Maintenance Due  Topic Date Due   Pneumococcal Vaccine 12-49 Years old (2 of 2 - PCV) 08/01/2016   Colonoscopy  Never done   OPHTHALMOLOGY EXAM  01/16/2021   INFLUENZA VACCINE  02/05/2023   COVID-19 Vaccine (4 - 2024-25 season) 03/08/2023       04/01/2023   10:32 AM 09/05/2021    8:09 AM 06/07/2021    7:57 AM  Depression screen PHQ 2/9  Decreased Interest 0 0 0  Down, Depressed, Hopeless 0 0 0  PHQ - 2 Score 0 0 0

## 2023-09-30 ENCOUNTER — Encounter: Payer: Self-pay | Admitting: Adult Health

## 2023-09-30 LAB — HIV ANTIBODY (ROUTINE TESTING W REFLEX): HIV 1&2 Ab, 4th Generation: NONREACTIVE

## 2023-09-30 LAB — RPR: RPR Ser Ql: NONREACTIVE

## 2023-09-30 NOTE — Progress Notes (Signed)
 [facility] Vernon Reed 48 year old male 1976/02/14   HOME SLEEP TEST REPORT ( by Watch PAT)   STUDY DATE: 09-29-2023   ORDERING CLINICIAN: Melvyn Novas, MD  REFERRING CLINICIAN: Shackleford, Lovell Sheehan, Md [address] [place],  [place] [address]    CLINICAL INFORMATION/HISTORY: Vernon Reed. Vernon Reed is a 48 y.o. 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  Chief concern according to patient :  " I was tested a decade ago, put on CPAP and then lost an adapter,  d/c  the CPAP since  8 years ago, and kept the same weight. My sleep is some nights restless, I have sometimes sinus problems, wake with a dry mouth."    The patient had the first sleep study ion 03-02-2019,  with a result of an AHI ( Apnea Hypopnea index)  of 32.9/h and was split, titrated to 10 cm water, under an [employer] FFM in medium, SIMPLUS model.  REM sleep rebounded , high sleep efficiency,  he didn't have Facial hair at the time - unlike now.     Epworth sleepiness score: 10/ 24 points.  FSS endorsed at 27/ 63 points.    BMI: 40 kg/m   Neck Circumference: 18", functional macroglossia.    FINDINGS:   Sleep Summary:   Total Recording Time (hours, min):   7 hours 40 minutes  Total Sleep Time (hours, min):   7 hours 1 minute              Percent REM (%) 33.3%:       This WatchPAT device measured the sleep latency at 23 minutes, the REM sleep latency at only 25 minutes, and the wakefulness after sleep onset minutes at 15 minutes.                                   Respiratory Indices:   Calculated pAHI (per AASM or CMS guideline):.   30/h                      REM pAHI:    Per AASM Guideline 60/h                                           NREM pAHI: 23.4/h                             Positional AHI: The patient slept 250 minutes on his left side with an AHI of  21/h followed by 128.5  minutes on his right with an AHI of 38.4/h and only 78 minutes in supine with an AHI of 40.5/h Snoring: Mean volume of snoring was 40 dB and snoring was present for about 18% of the total sleep time.                                              Oxygen Saturation Statistics:   Oxygen Saturation (%)  Mean:    The mean oxygen saturation during sleep was 92% was between a minimum of 83 and a maximum of 97%                            O2 Saturation (minutes) <89%: 3.1 minutes          Pulse Rate Statistics:   Pulse Mean (bpm):   83 bpm              Pulse Range:    Between a minimum heart rate of 60 and a maximum of 103 bpm.  Please note that this home sleep test device cannot give information about cardiac rhythm.             IMPRESSION:  This HST confirms the presence of severe and all obstructive sleep apnea which was much higher in rem sleep than in non-REM REM sleep.  There were REM sleep dependent hypoxic events noted during which the pulse rate also dropped.  We had a brief opportunity to capture REM sleep in supine, associated with the most severe oxygen desaturation pulse rates decline but no significant snoring was noted.   RECOMMENDATION: Given Vernon Reed all anatomy and his body mass index as well as the severity of this apnea I will opt for positive airway pressure therapy.  I do not expect the REM dependent apnea to respond to a dental device or an inspire device.  To qualify for inspire device also is dependent on a body mass index and the best results with that technique are seen in patients with a body mass index of 30 or under.  I will order an autotitration device by [employer] providing continuous pressure between settings of 6 and 18 cm water with 3 cm EPR, heated humidification will be provided and the patient can choose a interface of his choice. In the past valid CPAP was used the patient used a fullface mask and medium Fisher [employer] Paykel Simplus model.   That was before he had facial hair and I do not think his snoring is significant enough to not try a nasal interface instead he may opt for a nasal pillow for a nasal cradle.     NTERPRETING PHYSICIAN:   Melvyn Novas, MD  [facility] Sleep Board certified by The [employer] of Sleep Medicine and Diplomate of the [employer] of Sleep Medicine. Board certified In Neurology through the ABPN, Fellow of the [employer] of Neu[employer].

## 2023-10-04 ENCOUNTER — Encounter: Payer: Self-pay | Admitting: Neurology

## 2023-10-04 DIAGNOSIS — R0683 Snoring: Secondary | ICD-10-CM | POA: Insufficient documentation

## 2023-10-04 DIAGNOSIS — G478 Other sleep disorders: Secondary | ICD-10-CM | POA: Insufficient documentation

## 2023-10-04 NOTE — Procedures (Signed)
 [facility] Vernon Reed 48 year old male 1975/09/17   HOME SLEEP TEST REPORT ( by Watch PAT)   STUDY DATE: 09-29-2023   ORDERING CLINICIAN: Melvyn Novas, MD  REFERRING CLINICIAN: Shackleford, Lovell Sheehan, Md [address] [place],  [place] [address]    CLINICAL INFORMATION/HISTORY: Vernon Reed. 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  Chief concern according to patient :  " I was tested a decade ago, put on CPAP and then lost an adapter,  d/c  the CPAP since  8 years ago, and kept the same weight. My sleep is some nights restless, I have sometimes sinus problems, wake with a dry mouth."    The patient had the first sleep study ion 03-02-2019,  with a result of an AHI ( Apnea Hypopnea index)  of 32.9/h and was split, titrated to 10 cm water, under an [employer] FFM in medium, SIMPLUS model.  REM sleep rebounded , high sleep efficiency,  he didn't have Facial hair at the time - unlike now.     Epworth sleepiness score: 10/ 24 points.  FSS endorsed at 27/ 63 points.    BMI: 40 kg/m   Neck Circumference: 18", functional macroglossia.    FINDINGS:   Sleep Summary:   Total Recording Time (hours, min):   7 hours 40 minutes  Total Sleep Time (hours, min):   7 hours 1 minute              Percent REM (%) 33.3%:       This WatchPAT device measured the sleep latency at 23 minutes, the REM sleep latency at only 25 minutes, and the wakefulness after sleep onset minutes at 15 minutes.                                   Respiratory Indices:   Calculated pAHI (per AASM or CMS guideline):.   30/h                      REM pAHI:    Per AASM Guideline 60/h                                           NREM pAHI: 23.4/h                             Positional AHI: The patient slept 250 minutes on his left side with an AHI of 21/h  followed by 128.5  minutes on his right with an AHI of 38.4/h and only 78 minutes in supine with an AHI of 40.5/h Snoring: Mean volume of snoring was 40 dB and snoring was present for about 18% of the total sleep time.                                              Oxygen Saturation Statistics:   Oxygen Saturation (%) Mean:    The  mean oxygen saturation during sleep was 92% was between a minimum of 83 and a maximum of 97%                            O2 Saturation (minutes) <89%: 3.1 minutes          Pulse Rate Statistics:   Pulse Mean (bpm):   83 bpm              Pulse Range:    Between a minimum heart rate of 60 and a maximum of 103 bpm.  Please note that this home sleep test device cannot give information about cardiac rhythm.             IMPRESSION:  This HST confirms the presence of severe and all obstructive sleep apnea which was much higher in rem sleep than in non-REM REM sleep.  There were REM sleep dependent hypoxic events noted during which the pulse rate also dropped.  We had a brief opportunity to capture REM sleep in supine, associated with the most severe oxygen desaturation pulse rates decline but no significant snoring was noted.   RECOMMENDATION: Given [name] all anatomy and his body mass index as well as the severity of this apnea I will opt for positive airway pressure therapy.  I do not expect the REM dependent apnea to respond to a dental device or an inspire device.  To qualify for inspire device also is dependent on a body mass index and the best results with that technique are seen in patients with a body mass index of 30 or under.  I will order an autotitration device by [employer] providing continuous pressure between settings of 6 and 18 cm water with 3 cm EPR, heated humidification will be provided and the patient can choose a interface of his choice. In the past valid CPAP was used the patient used a fullface mask ,medium sized Fisher and [employer]kel Simplus model.    That was before he had facial hair and I do not think his snoring is significant enough to not try a nasal interface instead he may opt for a nasal pillow for a nasal cradle.     NTERPRETING PHYSICIAN:   Melvyn Novas, MD  [facility] Sleep Board certified by The [employer] of Sleep Medicine and Diplomate of the [employer] Sleep Medicine. Board certified In Neurology through the ABPN, Fellow of the [employer] Neu[employer].

## 2023-10-05 ENCOUNTER — Encounter: Payer: Self-pay | Admitting: Gastroenterology

## 2023-10-05 ENCOUNTER — Other Ambulatory Visit: Payer: Self-pay | Admitting: Adult Health

## 2023-10-05 NOTE — Telephone Encounter (Signed)
 Bobbye Morton, CMA  Ellis Parents, Wyvonnia Dusky, [place]; Angus Seller, Wray Kearns, Elease Hashimoto New orders have been placed for the above pt, DOB: 1975/10/08 Thanks

## 2023-10-28 ENCOUNTER — Ambulatory Visit (AMBULATORY_SURGERY_CENTER)

## 2023-10-28 VITALS — Ht 72.0 in | Wt 306.0 lb

## 2023-10-28 DIAGNOSIS — Z1211 Encounter for screening for malignant neoplasm of colon: Secondary | ICD-10-CM

## 2023-10-28 MED ORDER — NA SULFATE-K SULFATE-MG SULF 17.5-3.13-1.6 GM/177ML PO SOLN: 1.0000 | Freq: Once | ORAL | 0 refills | Status: AC

## 2023-10-28 NOTE — Progress Notes (Signed)
 Pre visit completed via phone call; Patient verified name, DOB, and address; No egg or soy allergy known to patient  No issues known to pt with past sedation with any surgeries or procedures; Patient denies ever being told they had issues or difficulty with intubation;  No FH of Malignant Hyperthermia; Pt is not on diet pills; Pt is not on home 02;  Pt is not on blood thinners;  Pt denies issues with constipation;  No A fib or A flutter; Have any cardiac testing pending--NO Insurance verified during PV appt--- [employer] SHP Pt can ambulate without assistance;  Pt denies use of chewing tobacco; Discussed diabetic/weight loss medication holds; Discussed NSAID holds; Checked BMI to be less than 50; Pt instructed to use Singlecare.com or GoodRx for a price reduction on prep;  Patient's chart reviewed by Rogena Class CNRA prior to previsit and patient appropriate for the [facility]; Pre visit completed and red dot placed by patient's name on their procedure day (on provider's schedule);   Instructions sent to patient via MyChart per his request;

## 2023-11-02 ENCOUNTER — Encounter: Payer: Self-pay | Admitting: Gastroenterology

## 2023-11-08 ENCOUNTER — Encounter: Payer: Self-pay | Admitting: Gastroenterology

## 2023-11-11 ENCOUNTER — Ambulatory Visit: Admitting: Gastroenterology

## 2023-11-11 ENCOUNTER — Encounter: Payer: Self-pay | Admitting: Gastroenterology

## 2023-11-11 VITALS — BP 121/84 | HR 79 | Temp 97.9°F | Resp 25 | Ht 72.0 in | Wt 306.0 lb

## 2023-11-11 DIAGNOSIS — K641 Second degree hemorrhoids: Secondary | ICD-10-CM | POA: Diagnosis not present

## 2023-11-11 DIAGNOSIS — K635 Polyp of colon: Secondary | ICD-10-CM | POA: Diagnosis not present

## 2023-11-11 DIAGNOSIS — Z1211 Encounter for screening for malignant neoplasm of colon: Secondary | ICD-10-CM | POA: Diagnosis present

## 2023-11-11 DIAGNOSIS — K5730 Diverticulosis of large intestine without perforation or abscess without bleeding: Secondary | ICD-10-CM | POA: Diagnosis not present

## 2023-11-11 DIAGNOSIS — D120 Benign neoplasm of cecum: Secondary | ICD-10-CM | POA: Diagnosis not present

## 2023-11-11 MED ORDER — SODIUM CHLORIDE 0.9 % IV SOLN: 500.0000 mL | Freq: Once | INTRAVENOUS | Status: AC

## 2023-11-11 NOTE — Progress Notes (Signed)
 Report given to PACU, vss

## 2023-11-11 NOTE — Op Note (Signed)
 [facility] Patient Name: Vernon Reed Procedure Date: 11/11/2023 11:13 AM MRN: [id number] Endoscopist: Yong Henle , MD, [id number] Age: 48 Referring MD:  Date of Birth: 20-Nov-1975 Gender: Male Account #: [id number] Procedure:                Colonoscopy Indications:              Screening for colorectal malignant neoplasm Medicines:                Monitored Anesthesia Care Procedure:                Pre-Anesthesia Assessment:                           - Prior to the procedure, a History and Physical                            was performed, and patient medications and                            allergies were reviewed. The patient's tolerance of                            previous anesthesia was also reviewed. The risks                            and benefits of the procedure and the sedation                            options and risks were discussed with the patient.                            All questions were answered, and informed consent                            was obtained. Prior Anticoagulants: The patient has                            taken no anticoagulant or antiplatelet agents. ASA                            Grade Assessment: II - A patient with mild systemic                            disease. After reviewing the risks and benefits,                            the patient was deemed in satisfactory condition to                            undergo the procedure.                           After obtaining informed consent, the colonoscope  was passed under direct vision. Throughout the                            procedure, the patient's blood pressure, pulse, and                            oxygen  saturations were monitored continuously. The                            CF [id number] #[id number] was introduced through the anus                            and advanced to the 3 cm into the ileum. The                            colonoscopy  was performed without difficulty. The                            patient tolerated the procedure. The quality of the                            bowel preparation was adequate. The terminal ileum,                            ileocecal valve, appendiceal orifice, and rectum                            were photographed. Scope In: 11:24:34 AM Scope Out: 11:37:16 AM Scope Withdrawal Time: 0 hours 9 minutes 39 seconds  Total Procedure Duration: 0 hours 12 minutes 42 seconds  Findings:                 The digital rectal exam findings include                            hemorrhoids. Pertinent negatives include no                            palpable rectal lesions.                           The terminal ileum and ileocecal valve appeared                            normal.                           An 8 mm polyp was found in the cecum. The polyp was                            sessile. The polyp was removed with a cold snare.                            Resection and retrieval were complete.  Multiple small-mouthed diverticula were found in                            the entire colon.                           Normal mucosa was found in the entire colon                            otherwise.                           Non-bleeding non-thrombosed internal hemorrhoids                            were found during retroflexion, during perianal                            exam and during digital exam. The hemorrhoids were                            Grade II (internal hemorrhoids that prolapse but                            reduce spontaneously). Complications:            No immediate complications. Estimated Blood Loss:     Estimated blood loss was minimal. Impression:               - Hemorrhoids found on digital rectal exam.                           - The examined portion of the ileum was normal.                           - One 8 mm polyp in the cecum, removed with a cold                             snare. Resected and retrieved.                           - Diverticulosis in the entire examined colon.                           - Normal mucosa in the entire examined colon                            otherwise.                           - Non-bleeding non-thrombosed internal hemorrhoids. Recommendation:           - The patient will be observed post-procedure,                            until all discharge criteria are met.                           -  Discharge patient to home.                           - Patient has a contact number available for                            emergencies. The signs and symptoms of potential                            delayed complications were discussed with the                            patient. Return to normal activities tomorrow.                            Written discharge instructions were provided to the                            patient.                           - High fiber diet.                           - Use FiberCon 1-2 tablets PO daily.                           - Continue present medications.                           - Await pathology results.                           - Repeat colonoscopy in 5-10 years for surveillance                            based on pathology results and findings of                            adenomatous tissue.                           - The findings and recommendations were discussed                            with the patient.                           - The findings and recommendations were discussed                            with the designated responsible adult. Yong Henle, MD 11/11/2023 11:42:53 AM

## 2023-11-11 NOTE — Patient Instructions (Signed)
 YOU HAD AN ENDOSCOPIC PROCEDURE TODAY AT THE [facility]:   Refer to the procedure report that was given to you for any specific questions about what was found during the examination.  If the procedure report does not answer your questions, please call your gastroenterologist to clarify.  If you requested that your care partner not be given the details of your procedure findings, then the procedure report has been included in a sealed envelope for you to review at your convenience later.  YOU SHOULD EXPECT: Some feelings of bloating in the abdomen. Passage of more gas than usual.  Walking can help get rid of the air that was put into your GI tract during the procedure and reduce the bloating. If you had a lower endoscopy (such as a colonoscopy or flexible sigmoidoscopy) you may notice spotting of blood in your stool or on the toilet paper. If you underwent a bowel prep for your procedure, you may not have a normal bowel movement for a few days.  Please Note:  You might notice some irritation and congestion in your nose or some drainage.  This is from the oxygen  used during your procedure.  There is no need for concern and it should clear up in a day or so.  SYMPTOMS TO REPORT IMMEDIATELY:  Following lower endoscopy (colonoscopy or flexible sigmoidoscopy):  Excessive amounts of blood in the stool  Significant tenderness or worsening of abdominal pains  Swelling of the abdomen that is new, acute  Fever of 100F or higher   For urgent or emergent issues, a gastroenterologist can be reached at any hour by calling [phone]. Do not use MyChart messaging for urgent concerns.    DIET:  We do recommend a small meal at first, but then you may proceed to your regular diet.  Drink plenty of fluids but you should avoid alcoholic beverages for 24 hours. Follow a High Fiber Diet.  MEDICATIONS: Continue present medications. Use FiberCon 1-2 tablets by mouth daily.  FOLLOW UP: Await  pathology results. Repeat colonoscopy in 5-10 years for surveillance based on pathology result and finding of adenomatous tissue.  Please see handouts given to you by your recovery nurse: High Fiber Diet, Polyps, Diverticulosis, Hemorrhoids.  ACTIVITY:  You should plan to take it easy for the rest of today and you should NOT DRIVE or use heavy machinery until tomorrow (because of the sedation medicines used during the test).    FOLLOW UP: Our staff will call the number listed on your records the next business day following your procedure.  We will call around 7:15- 8:00 am to check on you and address any questions or concerns that you may have regarding the information given to you following your procedure. If we do not reach you, we will leave a message.     If any biopsies were taken you will be contacted by phone or by letter within the next 1-3 weeks.  Please call us  at [phone] if you have not heard about the biopsies in 3 weeks.    SIGNATURES/CONFIDENTIALITY: You and/or your care partner have signed paperwork which will be entered into your electronic medical record.  These signatures attest to the fact that that the information above on your After Visit Summary has been reviewed and is understood.  Full responsibility of the confidentiality of this discharge information lies with you and/or your care-partner.

## 2023-11-11 NOTE — Progress Notes (Signed)
 Called to room to assist during endoscopic procedure.  Patient ID and intended procedure confirmed with present staff. Received instructions for my participation in the procedure from the performing physician.

## 2023-11-11 NOTE — Progress Notes (Signed)
 GASTROENTEROLOGY PROCEDURE H&P NOTE   Primary Care Physician: Alto Atta, NP  HPI: York Harcum is a 48 y.o. male who presents for Colonoscopy for screening.  Past Medical History:  Diagnosis Date   AKI (acute kidney injury) (HCC) 07/27/2013   Cardiomyopathy (HCC) 07/25/2013   CHF (congestive heart failure) (HCC)    Chicken pox    Chronic systolic heart failure (HCC) 01/04/2014   GERD (gastroesophageal reflux disease)    Headache    "maybe 5 times/yr; nothing regular" (12/06/2014)   Heart block 07/25/2013   Hyperlipemia    Hypertension    Hypokalemia 07/26/2013   Hypoxia 12/06/2014   Obesity, morbid (HCC) 12/27/2013   10/28/23 BMI 41.5   OSA (obstructive sleep apnea)    uses CPAP   SOB (shortness of breath) 07/23/2013   Tachy-brady syndrome (HCC) 07/26/2013   Tachycardia 07/23/2013   Type II diabetes mellitus (HCC)    Past Surgical History:  Procedure Laterality Date   ABDOMINAL SURGERY  ~ 1987   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   APPENDECTOMY  ~ 1989   CARDIAC CATHETERIZATION  07/2013   LEFT AND RIGHT HEART CATHETERIZATION WITH CORONARY ANGIOGRAM N/A 07/26/2013   Procedure: LEFT AND RIGHT HEART CATHETERIZATION WITH CORONARY ANGIOGRAM;  Surgeon: Arleen Lacer, MD;  Location: [facility] CATH LAB;  Service: Cardiovascular;  Laterality: N/A;   Current Outpatient Medications  Medication Sig Dispense Refill   aspirin  81 MG EC tablet Take 1 tablet (81 mg total) by mouth daily. 30 tablet 6   atorvastatin  (LIPITOR) 10 MG tablet TAKE 1 TABLET BY MOUTH EVERY DAY 90 tablet 1   carvedilol  (COREG ) 12.5 MG tablet Take 1.5 tablets (18.75 mg total) by mouth 2 (two) times daily with a meal. 270 tablet 2   furosemide  (LASIX ) 40 MG tablet TAKE 1 TABLET BY MOUTH EVERY DAY AS NEEDED 90 tablet 0   isosorbide  mononitrate (IMDUR ) 30 MG 24 hr tablet Take 1 tablet (30 mg total) by mouth daily. 90 tablet 1   potassium chloride  (KLOR-CON ) 10 MEQ tablet TAKE 1 TABLET BY MOUTH EVERY DAY 90  tablet 1   sacubitril -valsartan  (ENTRESTO ) 97-103 MG TAKE 1 TABLET BY MOUTH TWICE A DAY 90 tablet 0   spironolactone  (ALDACTONE ) 25 MG tablet TAKE 1 TABLET BY MOUTH EVERY DAY 90 tablet 1   Blood Glucose Monitoring Suppl (TRUE METRIX METER) W/DEVICE KIT 1 each by Does not apply route once as needed. 1 kit 0   glucose blood (TRUE METRIX BLOOD GLUCOSE TEST) test strip Used 3 times daily before meals. 100 each 12   Semaglutide , 2 MG/DOSE, 8 MG/3ML SOPN Inject 2 mg as directed once a week. 9 mL 1   TRUEPLUS LANCETS 30G MISC 1 each by Does not apply route every morning. 50 each 11   ZYRTEC-D ALLERGY & CONGESTION 5-120 MG tablet Take 1 tablet by mouth 2 (two) times daily as needed.     Current Facility-Administered Medications  Medication Dose Route Frequency Provider Last Rate Last Admin   0.9 %  sodium chloride  infusion  500 mL Intravenous Once Mansouraty, Aaden Buckman Jr., MD        Current Outpatient Medications:    aspirin  81 MG EC tablet, Take 1 tablet (81 mg total) by mouth daily., Disp: 30 tablet, Rfl: 6   atorvastatin  (LIPITOR) 10 MG tablet, TAKE 1 TABLET BY MOUTH EVERY DAY, Disp: 90 tablet, Rfl: 1   carvedilol  (COREG ) 12.5 MG tablet, Take 1.5 tablets (18.75 mg total) by mouth 2 (two)  times daily with a meal., Disp: 270 tablet, Rfl: 2   furosemide  (LASIX ) 40 MG tablet, TAKE 1 TABLET BY MOUTH EVERY DAY AS NEEDED, Disp: 90 tablet, Rfl: 0   isosorbide  mononitrate (IMDUR ) 30 MG 24 hr tablet, Take 1 tablet (30 mg total) by mouth daily., Disp: 90 tablet, Rfl: 1   potassium chloride  (KLOR-CON ) 10 MEQ tablet, TAKE 1 TABLET BY MOUTH EVERY DAY, Disp: 90 tablet, Rfl: 1   sacubitril -valsartan  (ENTRESTO ) 97-103 MG, TAKE 1 TABLET BY MOUTH TWICE A DAY, Disp: 90 tablet, Rfl: 0   spironolactone  (ALDACTONE ) 25 MG tablet, TAKE 1 TABLET BY MOUTH EVERY DAY, Disp: 90 tablet, Rfl: 1   Blood Glucose Monitoring Suppl (TRUE METRIX METER) W/DEVICE KIT, 1 each by Does not apply route once as needed., Disp: 1 kit, Rfl: 0    glucose blood (TRUE METRIX BLOOD GLUCOSE TEST) test strip, Used 3 times daily before meals., Disp: 100 each, Rfl: 12   Semaglutide , 2 MG/DOSE, 8 MG/3ML SOPN, Inject 2 mg as directed once a week., Disp: 9 mL, Rfl: 1   TRUEPLUS LANCETS 30G MISC, 1 each by Does not apply route every morning., Disp: 50 each, Rfl: 11   ZYRTEC-D ALLERGY & CONGESTION 5-120 MG tablet, Take 1 tablet by mouth 2 (two) times daily as needed., Disp: , Rfl:   Current Facility-Administered Medications:    0.9 %  sodium chloride  infusion, 500 mL, Intravenous, Once, Mansouraty, Albino Alu., MD No Known Allergies Family History  Problem Relation Age of Onset   Hypertension Mother    Angioedema Mother    Hypertension Father    Sleep apnea Father    Colon polyps Neg Hx    Colon cancer Neg Hx    Esophageal cancer Neg Hx    Rectal cancer Neg Hx    Stomach cancer Neg Hx    Social History   Socioeconomic History   Marital status: Divorced    Spouse name: Not on file   Number of children: 3   Years of education: Not on file   Highest education level: Bachelor's degree (e.g., BA, AB, BS)  Occupational History   Occupation: Teaching laboratory technician: [employer]  Tobacco Use   Smoking status: Never   Smokeless tobacco: Never  Vaping Use   Vaping status: Never Used  Substance and Sexual Activity   Alcohol use: Yes    Alcohol/week: 1.0 standard drink of alcohol    Types: 1 Standard drinks or equivalent per week    Comment: rare   Drug use: No   Sexual activity: Yes  Other Topics Concern   Not on file  Social History Narrative   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       Social Drivers of Corporate investment banker Strain: Low Risk  (03/31/2023)   Overall Financial Resource Strain (CARDIA)    Difficulty of Paying Living Expenses: Not very hard  Food Insecurity: No Food Insecurity (03/31/2023)   Hunger Vital Sign    Worried About Running Out of Food in the Last Year: Never true    Ran  Out of Food in the Last Year: Never true  Transportation Needs: No Transportation Needs (03/31/2023)   PRAPARE - Administrator, Civil Service (Medical): No    Lack of Transportation (Non-Medical): No  Physical Activity: Insufficiently Active (03/31/2023)   Exercise Vital Sign    Days of Exercise per Week: 1 day    Minutes of Exercise per  Session: 10 min  Stress: No Stress Concern Present (03/31/2023)   [employer] of Occupational Health - Occupational Stress Questionnaire    Feeling of Stress : Not at all  Social Connections: Moderately Integrated (03/31/2023)   Social Connection and Isolation Panel [NHANES]    Frequency of Communication with Friends and Family: More than three times a week    Frequency of Social Gatherings with Friends and Family: Twice a week    Attends Religious Services: More than 4 times per year    Active Member of [employer] or Organizations: Yes    Attends Engineer, structural: More than 4 times per year    Marital Status: Divorced  Intimate Partner Violence: Unknown (10/07/2021)   Received from [employer], [facility]   HITS    Physically Hurt: Not on file    Insult or Talk Down To: Not on file    Threaten Physical Harm: Not on file    Scream or Curse: Not on file    Physical Exam: Today's Vitals   11/11/23 1025  BP: (!) 142/96  Pulse: 86  Temp: 97.9 F (36.6 C)  SpO2: 94%  Weight: (!) 306 lb (138.8 kg)  Height: 6' (1.829 m)   Body mass index is 41.5 kg/m. GEN: NAD EYE: Sclerae anicteric ENT: MMM CV: Non-tachycardic GI: Soft, NT/ND NEURO:  Alert & Oriented x 3  Lab Results: No results for input(s): "WBC", "HGB", "HCT", "PLT" in the last 72 hours. BMET No results for input(s): "NA", "K", "CL", "CO2", "GLUCOSE", "BUN", "CREATININE", "CALCIUM " in the last 72 hours. LFT No results for input(s): "PROT", "ALBUMIN ", "AST", "ALT", "ALKPHOS", "BILITOT", "BILIDIR", "IBILI" in the last 72 hours. PT/INR No results for  input(s): "LABPROT", "INR" in the last 72 hours.   Impression / Plan: This is a 48 y.o.male who presents for Colonoscopy for screening.  The risks and benefits of endoscopic evaluation/treatment were discussed with the patient and/or family; these include but are not limited to the risk of perforation, infection, bleeding, missed lesions, lack of diagnosis, severe illness requiring hospitalization, as well as anesthesia and sedation related illnesses.  The patient's history has been reviewed, patient examined, no change in status, and deemed stable for procedure.  The patient and/or family is agreeable to proceed.    Yong Henle, MD [facility] Advanced [facility]oscopy Office # [phone]

## 2023-11-11 NOTE — Progress Notes (Signed)
 Pt's states no medical or surgical changes since previsit or office visit.

## 2023-11-12 ENCOUNTER — Telehealth: Payer: Self-pay

## 2023-11-12 NOTE — Telephone Encounter (Signed)
 No answer after follow up call. Voice message left.

## 2023-11-13 LAB — SURGICAL PATHOLOGY

## 2023-11-14 ENCOUNTER — Encounter: Payer: Self-pay | Admitting: Gastroenterology

## 2023-11-17 ENCOUNTER — Other Ambulatory Visit: Payer: Self-pay | Admitting: Cardiovascular Disease

## 2023-11-17 ENCOUNTER — Other Ambulatory Visit: Payer: Self-pay | Admitting: Adult Health

## 2023-11-17 DIAGNOSIS — I10 Essential (primary) hypertension: Secondary | ICD-10-CM

## 2023-11-17 DIAGNOSIS — I5022 Chronic systolic (congestive) heart failure: Secondary | ICD-10-CM

## 2023-12-13 ENCOUNTER — Ambulatory Visit: Admission: EM | Admit: 2023-12-13 | Discharge: 2023-12-13 | Disposition: A | Discharge status: Home / Self Care

## 2023-12-13 DIAGNOSIS — E1169 Type 2 diabetes mellitus with other specified complication: Secondary | ICD-10-CM

## 2023-12-13 DIAGNOSIS — M436 Torticollis: Secondary | ICD-10-CM | POA: Diagnosis not present

## 2023-12-13 DIAGNOSIS — M549 Dorsalgia, unspecified: Secondary | ICD-10-CM | POA: Diagnosis not present

## 2023-12-13 MED ORDER — NAPROXEN 500 MG PO TABS: 500.0000 mg | ORAL_TABLET | Freq: Two times a day (BID) | ORAL | 0 refills | Status: DC

## 2023-12-13 MED ORDER — TIZANIDINE HCL 4 MG PO TABS: 4.0000 mg | ORAL_TABLET | Freq: Four times a day (QID) | ORAL | 0 refills | Status: AC | PRN
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
## 2024-01-15 ENCOUNTER — Other Ambulatory Visit: Payer: Self-pay | Admitting: Medical Genetics

## 2024-01-15 DIAGNOSIS — Z006 Encounter for examination for normal comparison and control in clinical research program: Secondary | ICD-10-CM

## 2024-01-30 ENCOUNTER — Other Ambulatory Visit: Payer: Self-pay | Admitting: Adult Health

## 2024-02-01 LAB — GENECONNECT MOLECULAR SCREEN: Genetic Analysis Overall Interpretation: NEGATIVE

## 2024-02-08 ENCOUNTER — Other Ambulatory Visit: Payer: Self-pay | Admitting: Adult Health

## 2024-02-08 DIAGNOSIS — I509 Heart failure, unspecified: Secondary | ICD-10-CM

## 2024-02-17 ENCOUNTER — Telehealth: Payer: Self-pay | Admitting: Cardiovascular Disease

## 2024-02-17 DIAGNOSIS — I509 Heart failure, unspecified: Secondary | ICD-10-CM

## 2024-02-17 NOTE — Telephone Encounter (Signed)
 *  STAT* If patient is at the pharmacy, call can be transferred to refill team.   1. Which medications need to be refilled? (please list name of each medication and dose if known) spironolactone (ALDACTONE) 25 MG tablet  2. Which pharmacy/location (including street and city if local pharmacy) is medication to be sent to? [id number] - [place],  - [address]  3. Do they need a 30 day or 90 day supply? 90

## 2024-02-18 MED ORDER — SPIRONOLACTONE 25 MG PO TABS: 25.0000 mg | ORAL_TABLET | Freq: Every day | ORAL | 1 refills | Status: AC

## 2024-03-23 ENCOUNTER — Telehealth: Payer: Self-pay | Admitting: Cardiovascular Disease

## 2024-03-23 MED ORDER — CARVEDILOL 12.5 MG PO TABS: 18.7500 mg | ORAL_TABLET | Freq: Two times a day (BID) | ORAL | 1 refills | Status: AC

## 2024-03-23 NOTE — Telephone Encounter (Signed)
*  STAT* If patient is at the pharmacy, call can be transferred to refill team.   1. Which medications need to be refilled? (please list name of each medication and dose if known) carvedilol  (COREG ) 12.5 MG tablet    2. Would you like to learn more about the convenience, safety, & potential cost savings by using the [facility]? No    3. Are you open to using the [facility] (Type [facility]. ). No   4. Which pharmacy/location (including street and city if local pharmacy) is medication to be sent to? [id number] - [place], [place] - [address]     5. Do they need a 30 day or 90 day supply? 90 day

## 2024-03-23 NOTE — Telephone Encounter (Signed)
 Pt's medication was sent to pt's pharmacy as requested. Confirmation received.

## 2024-03-30 ENCOUNTER — Other Ambulatory Visit: Payer: Self-pay

## 2024-03-30 ENCOUNTER — Telehealth: Payer: Self-pay | Admitting: Adult Health

## 2024-03-30 DIAGNOSIS — E1169 Type 2 diabetes mellitus with other specified complication: Secondary | ICD-10-CM

## 2024-03-30 MED ORDER — OZEMPIC (2 MG/DOSE) 8 MG/3ML ~~LOC~~ SOPN: 2.0000 mL | PEN_INJECTOR | SUBCUTANEOUS | 0 refills | Status: DC

## 2024-03-30 NOTE — Telephone Encounter (Signed)
 Copied from CRM #[id number]. Topic: Clinical - Medication Refill >> Mar 30, 2024  3:03 PM Lauren C wrote: Medication: OZEMPIC  (2 MG/DOSE) 8MG /3ML  Not on med list, but was filled by Nafziger within the last year and we have prior auth.   Has the patient contacted their pharmacy? No (Agent: If no, request that the patient contact the pharmacy for the refill. If patient does not wish to contact the pharmacy document the reason why and proceed with request.) (Agent: If yes, when and what did the pharmacy advise?)  This is the patient's preferred pharmacy:  [place] [facility] [address] [facility] - [place], [place] - [address] [place] [address] Phone: [phone] Fax: [phone]  Is this the correct pharmacy for this prescription? Yes If no, delete pharmacy and type the correct one.   Has the prescription been filled recently? Yes, prior auth received 04/10/23 approved until 04/09/26.   Is the patient out of the medication? Yes  Has the patient been seen for an appointment in the last year OR does the patient have an upcoming appointment? Yes  Can we respond through MyChart? Yes  Agent: Please be advised that Rx refills may take up to 3 business days. We ask that you follow-up with your pharmacy.

## 2024-03-30 NOTE — Telephone Encounter (Signed)
 Rx sent in to pharmacy. Pt notified of update.

## 2024-03-30 NOTE — Telephone Encounter (Signed)
 Pt is due for a visit. Pt is scheduled for 04/05/24 for CPE. Ok to send in Health and safety inspector

## 2024-03-30 NOTE — Telephone Encounter (Signed)
 Requesting Ozempic , not on profile

## 2024-03-31 ENCOUNTER — Telehealth: Payer: Self-pay | Admitting: *Deleted

## 2024-03-31 NOTE — Telephone Encounter (Signed)
 Copied from CRM [id number]. Topic: Clinical - Medication Question >> Mar 31, 2024 11:04 AM Alfonso HERO wrote: Reason for CRM: Pharmacy called needing clarification on medication instructions for Semaglutide , 2 MG/DOSE, (OZEMPIC , 2 MG/DOSE,) 8 MG/3ML SOPN. Please contact Pam @ [phone].

## 2024-03-31 NOTE — Telephone Encounter (Signed)
 Called Pam no answer. The medication sig states to inject into the skin weekly. Not sure what else is needed.

## 2024-04-01 ENCOUNTER — Telehealth: Payer: Self-pay | Admitting: Cardiovascular Disease

## 2024-04-01 MED ORDER — SACUBITRIL-VALSARTAN 97-103 MG PO TABS: 1.0000 | ORAL_TABLET | Freq: Two times a day (BID) | ORAL | 1 refills | Status: DC

## 2024-04-01 NOTE — Telephone Encounter (Signed)
*  STAT* If patient is at the pharmacy, call can be transferred to refill team.   1. Which medications need to be refilled? (please list name of each medication and dose if known)  sacubitril -valsartan  (ENTRESTO ) 97-103 MG   2. Which pharmacy/location (including street and city if local pharmacy) is medication to be sent to? [id number] - [place], [place] - [address]  3. Do they need a 30 day or 90 day supply?  90 day supply

## 2024-04-01 NOTE — Telephone Encounter (Signed)
 Called again no answer

## 2024-04-01 NOTE — Telephone Encounter (Signed)
 Spoke with pt. Pt was notified that Entresto  97/103 was sent to his pharmacy.

## 2024-04-01 NOTE — Telephone Encounter (Signed)
 Called Vernon Reed again no answer. 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 No further action needed.

## 2024-04-05 ENCOUNTER — Other Ambulatory Visit (HOSPITAL_COMMUNITY)
Admission: RE | Admit: 2024-04-05 | Discharge: 2024-04-05 | Disposition: A | Discharge status: Home / Self Care | Source: Ambulatory Visit | Attending: Adult Health | Admitting: Adult Health

## 2024-04-05 ENCOUNTER — Ambulatory Visit: Payer: Self-pay | Admitting: Adult Health

## 2024-04-05 ENCOUNTER — Ambulatory Visit (INDEPENDENT_AMBULATORY_CARE_PROVIDER_SITE_OTHER): Admitting: Adult Health

## 2024-04-05 ENCOUNTER — Encounter: Payer: Self-pay | Admitting: Adult Health

## 2024-04-05 VITALS — BP 120/80 | HR 92 | Temp 98.7°F | Wt 308.0 lb

## 2024-04-05 DIAGNOSIS — Z113 Encounter for screening for infections with a predominantly sexual mode of transmission: Secondary | ICD-10-CM | POA: Diagnosis present

## 2024-04-05 DIAGNOSIS — I10 Essential (primary) hypertension: Secondary | ICD-10-CM

## 2024-04-05 DIAGNOSIS — I5022 Chronic systolic (congestive) heart failure: Secondary | ICD-10-CM

## 2024-04-05 DIAGNOSIS — E119 Type 2 diabetes mellitus without complications: Secondary | ICD-10-CM

## 2024-04-05 DIAGNOSIS — Z7985 Long-term (current) use of injectable non-insulin antidiabetic drugs: Secondary | ICD-10-CM

## 2024-04-05 DIAGNOSIS — Z0000 Encounter for general adult medical examination without abnormal findings: Secondary | ICD-10-CM

## 2024-04-05 DIAGNOSIS — Z6841 Body Mass Index (BMI) 40.0 and over, adult: Secondary | ICD-10-CM | POA: Diagnosis not present

## 2024-04-05 LAB — CBC
HCT: 40.9 % (ref 39.0–52.0)
Hemoglobin: 13.5 g/dL (ref 13.0–17.0)
MCHC: 32.9 g/dL (ref 30.0–36.0)
MCV: 91.5 fl (ref 78.0–100.0)
Platelets: 240 K/uL (ref 150.0–400.0)
RBC: 4.47 Mil/uL (ref 4.22–5.81)
RDW: 14.2 % (ref 11.5–15.5)
WBC: 7.4 K/uL (ref 4.0–10.5)

## 2024-04-05 LAB — LIPID PANEL
Cholesterol: 107 mg/dL (ref 0–200)
HDL: 27.1 mg/dL — ABNORMAL LOW (ref >39.00)
LDL Cholesterol: 63 mg/dL (ref 0–99)
NonHDL: 79.58
Total CHOL/HDL Ratio: 4
Triglycerides: 82 mg/dL (ref 0.0–149.0)
VLDL: 16.4 mg/dL (ref 0.0–40.0)

## 2024-04-05 LAB — COMPREHENSIVE METABOLIC PANEL WITH GFR
ALT: 33 U/L (ref 0–53)
AST: 18 U/L (ref 0–37)
Albumin: 4 g/dL (ref 3.5–5.2)
Alkaline Phosphatase: 48 U/L (ref 39–117)
BUN: 17 mg/dL (ref 6–23)
CO2: 29 meq/L (ref 19–32)
Calcium: 8.8 mg/dL (ref 8.4–10.5)
Chloride: 103 meq/L (ref 96–112)
Creatinine, Ser: 1.12 mg/dL (ref 0.40–1.50)
GFR: 77.23 mL/min (ref >60.00)
Glucose, Bld: 109 mg/dL — ABNORMAL HIGH (ref 70–99)
Potassium: 3.7 meq/L (ref 3.5–5.1)
Sodium: 139 meq/L (ref 135–145)
Total Bilirubin: 0.8 mg/dL (ref 0.2–1.2)
Total Protein: 7 g/dL (ref 6.0–8.3)

## 2024-04-05 LAB — MICROALBUMIN / CREATININE URINE RATIO
Creatinine,U: 120.4 mg/dL
Microalb Creat Ratio: UNDETERMINED mg/g (ref 0.0–30.0)
Microalb, Ur: <0.7 mg/dL

## 2024-04-05 LAB — HEMOGLOBIN A1C: Hgb A1c MFr Bld: 6.1 % (ref 4.6–6.5)

## 2024-04-05 LAB — TSH: TSH: 2.11 u[IU]/mL (ref 0.35–5.50)

## 2024-04-05 NOTE — Patient Instructions (Signed)
 It was great seeing you today   We will follow up with you regarding your lab work   Please let me know if you need anything

## 2024-04-05 NOTE — Progress Notes (Signed)
 Subjective:    Patient ID: Sayeed Weatherall, male    DOB: 02-14-1976, 48 y.o.   MRN: [id number]  HPI Patient presents for yearly preventative medicine examination. He is a pleasant 48 year old male who  has a past medical history of AKI (acute kidney injury) (07/27/2013), Cardiomyopathy (HCC) (07/25/2013), CHF (congestive heart failure) (HCC), Chicken pox, Chronic systolic heart failure (HCC) (92/98/7984), GERD (gastroesophageal reflux disease), Headache, Heart block (07/25/2013), Hyperlipemia, Hypertension, Hypokalemia (07/26/2013), Hypoxia (12/06/2014), Obesity, morbid (HCC) (12/27/2013), OSA (obstructive sleep apnea), SOB (shortness of breath) (07/23/2013), Tachy-brady syndrome (HCC) (07/26/2013), Tachycardia (07/23/2013), and Type II diabetes mellitus (HCC).  Diabetes mellitus type 2-he is maintained on Ozempic  2 mg  and was tolerating this medication well.  He checks his blood sugar every other day and reports readings in the 90-low 100's.  Lab Results  Component Value Date   HGBA1C 5.6 09/29/2023   HGBA1C 6.0 04/01/2023   HGBA1C 6.2 03/27/2022   HTN -managed with Coreg  12.5 mg - 1.5 tablets BID, Lasix  40 mg daily, hydralazine  50 mg 3 times daily ( he has not taken Hydralazine  in  a long time,  isosorbide  30 mg daily, metoprolol  100 mg twice daily, spirloactone 25 mg daily and Entresto  97-103 mg twice daily BP Readings from Last 3 Encounters:  04/05/24 120/80  12/13/23 (!) 138/92  11/11/23 121/84   Obesity - he has not been exercising but has been eating better.  Wt Readings from Last 3 Encounters:  04/05/24 (!) 308 lb (139.7 kg)  12/13/23 (!) 301 lb (136.5 kg)  11/11/23 (!) 306 lb (138.8 kg)   History of CHF -managed by the heart failure clinic.  The EF is now normal.  He has moderate left ventricular hypertrophy.  He has grade 1 diastolic dysfunctions.  This is managed by cardiology with Coreg  12.5 mg - 1.5 tablets BID, Lasix  40 mg daily, hydralazine  50 mg 3 times daily,  isosorbide  30 mg daily, metoprolol  100 mg twice daily, spirloactone 25 mg daily and Entresto  97-103 mg twice daily  STD testing - he denies symptoms.   Hyperlipidemia - managed with Lipitor 10 mg daily. He denies myalgia or fatigue Lab Results  Component Value Date   CHOL 166 04/01/2023   HDL 29.40 (L) 04/01/2023   LDLCALC 116 (H) 04/01/2023   TRIG 102.0 04/01/2023   CHOLHDL 6 04/01/2023      All immunizations and health maintenance protocols were reviewed with the patient and needed orders were placed.  Appropriate screening laboratory values were ordered for the patient including screening of hyperlipidemia, renal function and hepatic function.  Medication reconciliation,  past medical history, social history, problem list and allergies were reviewed in detail with the patient  Goals were established with regard to weight loss, exercise, and  diet in compliance with medications. He reports that his diet has been horrible and he is not exercising.   Review of Systems  Constitutional: Negative.   HENT: Negative.    Eyes: Negative.   Respiratory: Negative.    Cardiovascular: Negative.   Gastrointestinal: Negative.   Endocrine: Negative.   Genitourinary: Negative.   Musculoskeletal: Negative.   Skin: Negative.   Allergic/Immunologic: Negative.   Neurological: Negative.   Hematological: Negative.   Psychiatric/Behavioral: Negative.    All other systems reviewed and are negative.  Past Medical History:  Diagnosis Date   AKI (acute kidney injury) 07/27/2013   Cardiomyopathy (HCC) 07/25/2013   CHF (congestive heart failure) (HCC)    Chicken pox    Chronic  systolic heart failure (HCC) 01/04/2014   GERD (gastroesophageal reflux disease)    Headache    maybe 5 times/yr; nothing regular (12/06/2014)   Heart block 07/25/2013   Hyperlipemia    Hypertension    Hypokalemia 07/26/2013   Hypoxia 12/06/2014   Obesity, morbid (HCC) 12/27/2013   10/28/23 BMI 41.5   OSA  (obstructive sleep apnea)    uses CPAP   SOB (shortness of breath) 07/23/2013   Tachy-brady syndrome (HCC) 07/26/2013   Tachycardia 07/23/2013   Type II diabetes mellitus (HCC)     Social History   Socioeconomic History   Marital status: Divorced    Spouse name: Not on file   Number of children: 3   Years of education: Not on file   Highest education level: Bachelor's degree (e.g., BA, AB, BS)  Occupational History   Occupation: Teaching laboratory technician: [employer]  Tobacco Use   Smoking status: Never   Smokeless tobacco: Never  Vaping Use   Vaping status: Never Used  Substance and Sexual Activity   Alcohol use: Yes    Alcohol/week: 1.0 standard drink of alcohol    Types: 1 Standard drinks or equivalent per week    Comment: rare   Drug use: No   Sexual activity: Yes  Other Topics Concern   Not on file  Social History Narrative   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       Social Drivers of Corporate investment banker Strain: Low Risk  (04/01/2024)   Overall Financial Resource Strain (CARDIA)    Difficulty of Paying Living Expenses: Not very hard  Food Insecurity: No Food Insecurity (04/01/2024)   Hunger Vital Sign    Worried About Running Out of Food in the Last Year: Never true    Ran Out of Food in the Last Year: Never true  Transportation Needs: No Transportation Needs (04/01/2024)   PRAPARE - Administrator, Civil Service (Medical): No    Lack of Transportation (Non-Medical): No  Physical Activity: Insufficiently Active (04/01/2024)   Exercise Vital Sign    Days of Exercise per Week: 1 day    Minutes of Exercise per Session: 20 min  Stress: No Stress Concern Present (04/01/2024)   [employer] of Occupational Health - Occupational Stress Questionnaire    Feeling of Stress: Not at all  Social Connections: Moderately Integrated (04/01/2024)   Social Connection and Isolation Panel    Frequency of Communication with Friends  and Family: More than three times a week    Frequency of Social Gatherings with Friends and Family: More than three times a week    Attends Religious Services: More than 4 times per year    Active Member of [employer] or Organizations: Yes    Attends Banker Meetings: More than 4 times per year    Marital Status: Divorced  Intimate Partner Violence: Unknown (10/07/2021)   Received from [facility]   HITS    Physically Hurt: Not on file    Insult or Talk Down To: Not on file    Threaten Physical Harm: Not on file    Scream or Curse: Not on file    Past Surgical History:  Procedure Laterality Date   ABDOMINAL SURGERY  ~ 1987   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   APPENDECTOMY  ~ 1989   CARDIAC CATHETERIZATION  07/2013   LEFT AND RIGHT HEART CATHETERIZATION WITH CORONARY ANGIOGRAM N/A 07/26/2013  Procedure: LEFT AND RIGHT HEART CATHETERIZATION WITH CORONARY ANGIOGRAM;  Surgeon: Alm LELON Clay, MD;  Location: [facility] CATH LAB;  Service: Cardiovascular;  Laterality: N/A;    Family History  Problem Relation Age of Onset   Hypertension Mother    Angioedema Mother    Hypertension Father    Sleep apnea Father    Colon polyps Neg Hx    Colon cancer Neg Hx    Esophageal cancer Neg Hx    Rectal cancer Neg Hx    Stomach cancer Neg Hx     No Known Allergies  Current Outpatient Medications on File Prior to Visit  Medication Sig Dispense Refill   aspirin  81 MG EC tablet Take 1 tablet (81 mg total) by mouth daily. 30 tablet 6   atorvastatin  (LIPITOR) 10 MG tablet TAKE 1 TABLET BY MOUTH EVERY DAY 90 tablet 1   Blood Glucose Monitoring Suppl (TRUE METRIX METER) W/DEVICE KIT 1 each by Does not apply route once as needed. 1 kit 0   carvedilol  (COREG ) 12.5 MG tablet Take 1.5 tablets (18.75 mg total) by mouth 2 (two) times daily with a meal. 270 tablet 1   furosemide  (LASIX ) 40 MG tablet TAKE 1 TABLET BY MOUTH EVERY DAY AS NEEDED 90 tablet 2   glucose blood (TRUE METRIX BLOOD GLUCOSE  TEST) test strip Used 3 times daily before meals. 100 each 12   isosorbide  mononitrate (IMDUR ) 30 MG 24 hr tablet TAKE 1 TABLET BY MOUTH EVERY DAY 90 tablet 2   potassium chloride  (KLOR-CON ) 10 MEQ tablet TAKE 1 TABLET BY MOUTH EVERY DAY 90 tablet 1   sacubitril -valsartan  (ENTRESTO ) 97-103 MG Take 1 tablet by mouth 2 (two) times daily. 90 tablet 1   Semaglutide , 2 MG/DOSE, (OZEMPIC , 2 MG/DOSE,) 8 MG/3ML SOPN Inject 2 mLs into the skin once a week. 3 mL 0   spironolactone  (ALDACTONE ) 25 MG tablet Take 1 tablet (25 mg total) by mouth daily. 90 tablet 1   tiZANidine  (ZANAFLEX ) 4 MG tablet Take 1 tablet (4 mg total) by mouth every 6 (six) hours as needed for muscle spasms. 30 tablet 0   TRUEPLUS LANCETS 30G MISC 1 each by Does not apply route every morning. 50 each 11   ZYRTEC-D ALLERGY & CONGESTION 5-120 MG tablet Take 1 tablet by mouth 2 (two) times daily as needed.     Current Facility-Administered Medications on File Prior to Visit  Medication Dose Route Frequency Provider Last Rate Last Admin   0.9 %  sodium chloride  infusion  500 mL Intravenous Once Mansouraty, Gabriel Jr., MD        BP 120/80   Pulse 92   Temp 98.7 F (37.1 C) (Oral)   Wt (!) 308 lb (139.7 kg)   SpO2 96%   BMI 41.77 kg/m       Objective:   Physical Exam Vitals and nursing note reviewed.  Constitutional:      General: He is not in acute distress.    Appearance: Normal appearance. He is not ill-appearing.  HENT:     Head: Normocephalic and atraumatic.     Right Ear: Tympanic membrane, ear canal and external ear normal. There is no impacted cerumen.     Left Ear: Tympanic membrane, ear canal and external ear normal. There is no impacted cerumen.     Nose: Nose normal. No congestion or rhinorrhea.     Mouth/Throat:     Mouth: Mucous membranes are moist.     Pharynx: Oropharynx is clear.  Eyes:     Extraocular Movements: Extraocular movements intact.     Conjunctiva/sclera: Conjunctivae normal.     Pupils:  Pupils are equal, round, and reactive to light.  Neck:     Vascular: No carotid bruit.  Cardiovascular:     Rate and Rhythm: Normal rate and regular rhythm.     Pulses: Normal pulses.     Heart sounds: No murmur heard.    No friction rub. No gallop.  Pulmonary:     Effort: Pulmonary effort is normal.     Breath sounds: Normal breath sounds.  Abdominal:     General: Abdomen is flat. Bowel sounds are normal. There is no distension.     Palpations: Abdomen is soft. There is no mass.     Tenderness: There is no abdominal tenderness. There is no guarding or rebound.     Hernia: No hernia is present.  Musculoskeletal:        General: Normal range of motion.     Cervical back: Normal range of motion and neck supple.  Lymphadenopathy:     Cervical: No cervical adenopathy.  Skin:    General: Skin is warm and dry.     Capillary Refill: Capillary refill takes less than 2 seconds.  Neurological:     General: No focal deficit present.     Mental Status: He is alert and oriented to person, place, and time.  Psychiatric:        Mood and Affect: Mood normal.        Behavior: Behavior normal.        Thought Content: Thought content normal.        Judgment: Judgment normal.       Assessment & Plan:  1. Routine general medical examination at a health care facility (Primary) Today patient counseled on age appropriate routine health concerns for screening and prevention, each reviewed and up to date or declined. Immunizations reviewed and up to date or declined. Labs ordered and reviewed. Risk factors for depression reviewed and negative. Hearing function and visual acuity are intact. ADLs screened and addressed as needed. Functional ability and level of safety reviewed and appropriate. Education, counseling and referrals performed based on assessed risks today. Patient provided with a copy of personalized plan for preventive services. - Work on weight loss through diet and exercise - Follow up in  one year or sooner if needed   2. Long-term current use of injectable noninsulin antidiabetic medication - Continue with Ozempic   - Follow up in 6 months likely  - Lipid panel; Future - TSH; Future - CBC; Future - Comprehensive metabolic panel with GFR; Future - Hemoglobin A1c; Future - Microalbumin/Creatinine Ratio, Urine; Future  3. Essential hypertension - Well controlled. No change in medication  - Lipid panel; Future - TSH; Future - CBC; Future - Comprehensive metabolic panel with GFR; Future  4. Obesity, morbid (HCC) - This is his biggest issue right now. He needs to start eating healthy and exercising.  - He can check into [facility]  with his insurance.  - Lipid panel; Future - TSH; Future - CBC; Future - Comprehensive metabolic panel with GFR; Future  5. Chronic systolic heart failure (HCC) - Euvolemic today. Continue with Cardiology plan of care - Lipid panel; Future - TSH; Future - CBC; Future - Comprehensive metabolic panel with GFR; Future  6. Screening for STD (sexually transmitted disease)  - HIV Antibody (routine testing w rflx); Future - RPR; Future - Urine cytology ancillary only  [facility]  Fallen Crisostomo, NP

## 2024-04-06 LAB — HIV ANTIBODY (ROUTINE TESTING W REFLEX)
HIV 1&2 Ab, 4th Generation: NONREACTIVE
HIV FINAL INTERPRETATION: NEGATIVE

## 2024-04-06 LAB — URINE CYTOLOGY ANCILLARY ONLY
Chlamydia: NEGATIVE
Comment: NEGATIVE
Comment: NEGATIVE
Comment: NORMAL
Neisseria Gonorrhea: NEGATIVE
Trichomonas: NEGATIVE

## 2024-04-06 LAB — RPR: RPR Ser Ql: NONREACTIVE

## 2024-05-02 ENCOUNTER — Other Ambulatory Visit: Payer: Self-pay | Admitting: Family

## 2024-05-02 MED ORDER — METRONIDAZOLE 500 MG PO TABS: 500.0000 mg | ORAL_TABLET | Freq: Two times a day (BID) | ORAL | 0 refills | Status: AC

## 2024-05-03 ENCOUNTER — Other Ambulatory Visit: Payer: Self-pay | Admitting: Adult Health

## 2024-05-03 DIAGNOSIS — E1169 Type 2 diabetes mellitus with other specified complication: Secondary | ICD-10-CM

## 2024-05-03 NOTE — Telephone Encounter (Signed)
 Copied from CRM [id number]. Topic: Clinical - Medication Refill >> May 03, 2024  4:38 PM Shanda MATSU wrote: Medication: Semaglutide , 2 MG/DOSE, (OZEMPIC , 2 MG/DOSE,) 8 MG/3ML SOPN  Has the patient contacted their pharmacy? Yes (Agent: If no, request that the patient contact the pharmacy for the refill. If patient does not wish to contact the pharmacy document the reason why and proceed with request.) (Agent: If yes, when and what did the pharmacy advise?)  This is the patient's preferred pharmacy:  [place] [address] [facility] [place], [place] - [address] [place] [address] Phone: [phone] Fax: [phone]  Is this the correct pharmacy for this prescription? Yes If no, delete pharmacy and type the correct one.   Has the prescription been filled recently? No  Is the patient out of the medication? Yes  Has the patient been seen for an appointment in the last year OR does the patient have an upcoming appointment? Yes  Can we respond through MyChart? Yes  Agent: Please be advised that Rx refills may take up to 3 business days. We ask that you follow-up with your pharmacy.

## 2024-05-04 MED ORDER — OZEMPIC (2 MG/DOSE) 8 MG/3ML ~~LOC~~ SOPN
2.0000 mL | PEN_INJECTOR | SUBCUTANEOUS | 0 refills | Status: DC
Start: 2024-05-04 — End: 2024-05-24

## 2024-05-21 ENCOUNTER — Other Ambulatory Visit: Payer: Self-pay | Admitting: Adult Health

## 2024-05-21 DIAGNOSIS — I10 Essential (primary) hypertension: Secondary | ICD-10-CM

## 2024-05-24 ENCOUNTER — Other Ambulatory Visit: Payer: Self-pay | Admitting: Adult Health

## 2024-05-24 DIAGNOSIS — E1169 Type 2 diabetes mellitus with other specified complication: Secondary | ICD-10-CM

## 2024-05-24 NOTE — Telephone Encounter (Unsigned)
 Copied from CRM [id number]. Topic: Clinical - Medication Refill >> May 24, 2024  1:55 PM Roselie C wrote: Medication: Semaglutide , 2 MG/DOSE, (OZEMPIC , 2 MG/DOSE,) 8 MG/3ML SOPN Patient requests refills for several months put on it  Has the patient contacted their pharmacy? No (Agent: If no, request that the patient contact the pharmacy for the refill. If patient does not wish to contact the pharmacy document the reason why and proceed with request.) (Agent: If yes, when and what did the pharmacy advise?    [place] [address] [facility] [place], [place] - [address] [place] [address] Phone: [phone] Fax: [phone]  Is this the correct pharmacy for this prescription? Yes If no, delete pharmacy and type the correct one.   Has the prescription been filled recently? Yes  Is the patient out of the medication? Yes  Has the patient been seen for an appointment in the last year OR does the patient have an upcoming appointment? Yes  Can we respond through MyChart? Yes  Agent: Please be advised that Rx refills may take up to 3 business days. We ask that you follow-up with your pharmacy.

## 2024-05-25 MED ORDER — OZEMPIC (2 MG/DOSE) 8 MG/3ML ~~LOC~~ SOPN: PEN_INJECTOR | SUBCUTANEOUS | 1 refills | Status: AC

## 2024-06-26 ENCOUNTER — Other Ambulatory Visit: Payer: Self-pay | Admitting: Cardiovascular Disease

## 2024-07-13 ENCOUNTER — Encounter: Payer: Self-pay | Admitting: Cardiovascular Disease

## 2024-07-18 ENCOUNTER — Encounter: Payer: Self-pay | Admitting: Nurse Practitioner

## 2024-07-18 ENCOUNTER — Ambulatory Visit: Attending: Nurse Practitioner | Admitting: Nurse Practitioner

## 2024-07-18 VITALS — BP 124/86 | HR 88 | Ht 72.0 in | Wt 306.0 lb

## 2024-07-18 DIAGNOSIS — I10 Essential (primary) hypertension: Secondary | ICD-10-CM

## 2024-07-18 DIAGNOSIS — I5020 Unspecified systolic (congestive) heart failure: Secondary | ICD-10-CM | POA: Diagnosis not present

## 2024-07-18 DIAGNOSIS — E782 Mixed hyperlipidemia: Secondary | ICD-10-CM

## 2024-07-18 DIAGNOSIS — E1169 Type 2 diabetes mellitus with other specified complication: Secondary | ICD-10-CM | POA: Diagnosis not present

## 2024-07-18 DIAGNOSIS — G4733 Obstructive sleep apnea (adult) (pediatric): Secondary | ICD-10-CM

## 2024-07-18 NOTE — Patient Instructions (Signed)
 Medication Instructions:  Your physician recommends that you continue on your current medications as directed. Please refer to the Current Medication list given to you today.  *If you need a refill on your cardiac medications before your next appointment, please call your pharmacy*  Lab Work: NONE ordered at this time of appointment   Testing/Procedures: NONE ordered at this time of appointment   Follow-Up: At [facility], you and your health needs are our priority.  As part of our continuing mission to provide you with exceptional heart care, our providers are all part of one team.  This team includes your primary Cardiologist (physician) and Advanced Practice Providers or APPs (Physician Assistants and Nurse Practitioners) who all work together to provide you with the care you need, when you need it.  Your next appointment:   1 year(s)  Provider:   Dr. Francyne  We recommend signing up for the patient portal called MyChart.  Sign up information is provided on this After Visit Summary.  MyChart is used to connect with patients for Virtual Visits (Telemedicine).  Patients are able to view lab/test results, encounter notes, upcoming appointments, etc.  Non-urgent messages can be sent to your provider as well.   To learn more about what you can do with MyChart, go to [email or web address].

## 2024-07-18 NOTE — Progress Notes (Signed)
 "  Office Visit    Patient Name: Vernon Reed Date of Encounter: 07/18/2024  Primary Care Provider:  Merna Huxley, NP Primary Cardiologist:  Jerel Balding, MD  Chief Complaint    49 year old male with a history of heart failure with improved EF, moderate LVH, hypertension, hyperlipidemia, type 2 diabetes, OSA and GERD who presents for follow-up related to heart failure.  Past Medical History    Past Medical History:  Diagnosis Date   AKI (acute kidney injury) 07/27/2013   Cardiomyopathy (HCC) 07/25/2013   CHF (congestive heart failure) (HCC)    Chicken pox    Chronic systolic heart failure (HCC) 01/04/2014   GERD (gastroesophageal reflux disease)    Headache    maybe 5 times/yr; nothing regular (12/06/2014)   Heart block 07/25/2013   Hyperlipemia    Hypertension    Hypokalemia 07/26/2013   Hypoxia 12/06/2014   Obesity, morbid (HCC) 12/27/2013   10/28/23 BMI 41.5   OSA (obstructive sleep apnea)    uses CPAP   SOB (shortness of breath) 07/23/2013   Tachy-brady syndrome (HCC) 07/26/2013   Tachycardia 07/23/2013   Type II diabetes mellitus (HCC)    Past Surgical History:  Procedure Laterality Date   ABDOMINAL SURGERY  ~ 1987   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   APPENDECTOMY  ~ 1989   CARDIAC CATHETERIZATION  07/2013   LEFT AND RIGHT HEART CATHETERIZATION WITH CORONARY ANGIOGRAM N/A 07/26/2013   Procedure: LEFT AND RIGHT HEART CATHETERIZATION WITH CORONARY ANGIOGRAM;  Surgeon: Alm LELON Clay, MD;  Location: [facility] CATH LAB;  Service: Cardiovascular;  Laterality: N/A;    Allergies  Allergies[1]   Labs/Other Studies Reviewed    The following studies were reviewed today:  Cardiac Studies & Procedures   ______________________________________________________________________________________________     ECHOCARDIOGRAM  ECHOCARDIOGRAM COMPLETE 03/30/2019  Narrative ECHOCARDIOGRAM REPORT    Patient Name:   Vernon Reed Date of Exam: 03/30/2019 Medical  Rec #:  [id number]       Height:       72.0 in Accession #:    [id number]      Weight:       312.2 lb Date of Birth:  [birth year]       BSA:          2.57 m Patient Age:    43 years        BP:           138/86 mmHg Patient Gender: M               HR:           74 bpm. Exam Location:  Outpatient  Procedure: 2D Echo  Indications:    Congestive Heart Failure 428.0  History:        Patient has prior history of Echocardiogram examinations, most recent 10/07/2017. CHF Risk Factors:Hypertension, Diabetes and Dyslipidemia.  Sonographer:    Augustin Seals RDCS (AE) Referring Phys: [phone] AMY D CLEGG  IMPRESSIONS   1. Left ventricular ejection fraction, by visual estimation, is 55 to 60%. The left ventricle has normal function. There is moderately increased left ventricular hypertrophy. 2. Left ventricular diastolic Doppler parameters are consistent with impaired relaxation pattern of LV diastolic filling. 3. Global right ventricle has normal systolic function.The right ventricular size is normal. No increase in right ventricular wall thickness. 4. Left atrial size was normal. 5. Right atrial size was normal. 6. The mitral valve is normal in structure. No evidence of mitral valve regurgitation. 7. The tricuspid valve  is normal in structure. Tricuspid valve regurgitation is trivial. 8. The aortic valve is normal in structure. Aortic valve regurgitation is trivial by color flow Doppler. Structurally normal aortic valve, with no evidence of sclerosis or stenosis. 9. The pulmonic valve was normal in structure. Pulmonic valve regurgitation is trivial by color flow Doppler. 10. The atrial septum is grossly normal.  FINDINGS Left Ventricle: Left ventricular ejection fraction, by visual estimation, is 55 to 60%. The left ventricle has normal function. There is moderately increased left ventricular hypertrophy. Asymmetric left ventricular hypertrophy. Spectral Doppler shows Left ventricular diastolic  Doppler parameters are consistent with impaired relaxation pattern of LV diastolic filling.  Right Ventricle: The right ventricular size is normal. No increase in right ventricular wall thickness. Global RV systolic function is has normal systolic function.  Left Atrium: Left atrial size was normal in size.  Right Atrium: Right atrial size was normal in size  Pericardium: There is no evidence of pericardial effusion.  Mitral Valve: The mitral valve is normal in structure. No evidence of mitral valve regurgitation.  Tricuspid Valve: The tricuspid valve is normal in structure. Tricuspid valve regurgitation is trivial by color flow Doppler.  Aortic Valve: The aortic valve is normal in structure. Aortic valve regurgitation is trivial by color flow Doppler. The aortic valve is structurally normal, with no evidence of sclerosis or stenosis.  Pulmonic Valve: The pulmonic valve was normal in structure. Pulmonic valve regurgitation is trivial by color flow Doppler.  Aorta: The aortic root and ascending aorta are structurally normal, with no evidence of dilitation.  IAS/Shunts: The atrial septum is grossly normal.    LEFT VENTRICLE PLAX 2D LVIDd:         4.60 cm       Diastology LVIDs:         3.50 cm       LV e' lateral:   7.40 cm/s LV PW:         1.30 cm       LV E/e' lateral: 5.7 LV IVS:        1.40 cm       LV e' medial:    4.24 cm/s LVOT diam:     2.20 cm       LV E/e' medial:  9.9 LV SV:         46 ml LV SV Index:   16.93 LVOT Area:     3.80 cm  LV Volumes (MOD) LV area d, A2C:    29.40 cm LV area d, A4C:    31.60 cm LV area s, A2C:    16.50 cm LV area s, A4C:    19.60 cm LV major d, A2C:   9.43 cm LV major d, A4C:   9.20 cm LV major s, A2C:   7.89 cm LV major s, A4C:   7.58 cm LV vol d, MOD A2C: 79.6 ml LV vol d, MOD A4C: 90.8 ml LV vol s, MOD A2C: 29.6 ml LV vol s, MOD A4C: 45.2 ml LV SV MOD A2C:     50.0 ml LV SV MOD A4C:     90.8 ml LV SV MOD BP:      48.4  ml  RIGHT VENTRICLE RV S prime:     12.50 cm/s TAPSE (M-mode): 1.7 cm  LEFT ATRIUM             Index       RIGHT ATRIUM           Index  LA diam:        3.90 cm 1.51 cm/m  RA Area:     17.60 cm LA Vol (A2C):   54.3 ml 21.09 ml/m RA Volume:   46.70 ml  18.14 ml/m LA Vol (A4C):   54.3 ml 21.09 ml/m LA Biplane Vol: 53.5 ml 20.78 ml/m AORTIC VALVE LVOT Vmax:   72.50 cm/s LVOT Vmean:  47.800 cm/s LVOT VTI:    0.106 m  AORTA Ao Root diam: 4.40 cm Ao Asc diam:  3.40 cm  MITRAL VALVE MV Area (PHT): 1.55 cm             SHUNTS MV PHT:        142.39 msec          Systemic VTI:  0.11 m MV Decel Time: 491 msec             Systemic Diam: 2.20 cm MV E velocity: 42.00 cm/s 103 cm/s MV A velocity: 66.00 cm/s 70.3 cm/s MV E/A ratio:  0.64       1.5   Aleene Passe MD Electronically signed by Aleene Passe MD Signature Date/Time: 03/30/2019/6:07:35 PM    Final          ______________________________________________________________________________________________     Recent Labs: 04/05/2024: ALT 33; BUN 17; Creatinine, Ser 1.12; Hemoglobin 13.5; Platelets 240.0; Potassium 3.7; Sodium 139; TSH 2.11  Recent Lipid Panel    Component Value Date/Time   CHOL 107 04/05/2024 0835   TRIG 82.0 04/05/2024 0835   HDL 27.10 (L) 04/05/2024 0835   CHOLHDL 4 04/05/2024 0835   VLDL 16.4 04/05/2024 0835   LDLCALC 63 04/05/2024 0835   LDLCALC 111 (H) 02/02/2020 0836    History of Present Illness    49 year old male with the above past medical history including heart failure with improved EF, moderate LVH, hypertension, hyperlipidemia, type 2 diabetes, OSA and GERD.  Previously followed by Dr. Passe.  He has a history of heart failure with reduced EF, prior EF 30 to 35% in 2015.  Most recent echocardiogram in 03/2019 showed EF improved to 55 to 60%, normal LV function, moderate LVH, diastolic dysfunction impaired relaxation), normal RV systolic function, no significant valvular  abnormalities.  He was last seen in the office on 07/14/2023 and was stable from a cardiac standpoint.  He presents today for follow-up.  Since his last visit he has done well from a cardiac standpoint.  He denies any chest pain, palpitations, dizziness, dyspnea, edema, PND, with apnea, weight gain.  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  [place] played at [facility] [employer] and [facility] for New Year's Eve.  Overall, he reports feeling well.  Home Medications    Current Outpatient Medications  Medication Sig Dispense Refill   aspirin  81 MG EC tablet Take 1 tablet (81 mg total) by mouth daily. 30 tablet 6   atorvastatin  (LIPITOR) 10 MG tablet TAKE 1 TABLET BY MOUTH EVERY DAY 90 tablet 1   Blood Glucose Monitoring Suppl (TRUE METRIX METER) W/DEVICE KIT 1 each by Does not apply route once as needed. 1 kit 0   carvedilol  (COREG ) 12.5 MG tablet Take 1.5 tablets (18.75 mg total) by mouth 2 (two) times daily with a meal. 270 tablet 1   furosemide  (LASIX ) 40 MG tablet TAKE 1 TABLET BY MOUTH EVERY DAY AS NEEDED 90 tablet 2   glucose blood (TRUE METRIX BLOOD GLUCOSE TEST) test strip Used 3 times daily before meals. 100 each 12   isosorbide   mononitrate (IMDUR ) 30 MG 24 hr tablet TAKE 1 TABLET BY MOUTH EVERY DAY 90 tablet 2   potassium chloride  (KLOR-CON ) 10 MEQ tablet TAKE 1 TABLET BY MOUTH EVERY DAY 90 tablet 1   sacubitril -valsartan  (ENTRESTO ) 97-103 MG TAKE 1 TABLET BY MOUTH TWICE A DAY 180 tablet 0   Semaglutide , 2 MG/DOSE, (OZEMPIC , 2 MG/DOSE,) 8 MG/3ML SOPN Inject 2 mg subcutaneously once a week. 9 mL 1   spironolactone  (ALDACTONE ) 25 MG tablet Take 1 tablet (25 mg total) by mouth daily. 90 tablet 1   tiZANidine  (ZANAFLEX ) 4 MG tablet Take 1 tablet (4 mg total) by mouth every 6 (six) hours as needed for muscle spasms. 30 tablet 0   TRUEPLUS LANCETS 30G MISC 1 each by Does not apply route every morning. 50 each 11   ZYRTEC-D ALLERGY & CONGESTION 5-120 MG tablet Take 1 tablet by mouth 2 (two)  times daily as needed.     Current Facility-Administered Medications  Medication Dose Route Frequency Provider Last Rate Last Admin   0.9 %  sodium chloride  infusion  500 mL Intravenous Once Mansouraty, Aloha Raddle., MD         Review of Systems    He denies chest pain, palpitations, dyspnea, pnd, orthopnea, n, v, dizziness, syncope, edema, weight gain, or early satiety. All other systems reviewed and are otherwise negative except as noted above.   Physical Exam    VS:  BP 124/86 (Cuff Size: Large)   Pulse 88   Ht 6' (1.829 m)   Wt (!) 306 lb (138.8 kg)   SpO2 97%   BMI 41.50 kg/m  GEN: Well nourished, well developed, in no acute distress. HEENT: normal. Neck: Supple, no JVD, carotid bruits, or masses. Cardiac: RRR, no murmurs, rubs, or gallops. No clubbing, cyanosis, edema.  Radials/DP/PT 2+ and equal bilaterally.  Respiratory:  Respirations regular and unlabored, clear to auscultation bilaterally. GI: Soft, nontender, nondistended, BS + x 4. MS: no deformity or atrophy. Skin: warm and dry, no rash. Neuro:  Strength and sensation are intact. Psych: Normal affect.  Accessory Clinical Findings    ECG personally reviewed by me today - EKG Interpretation Date/Time:  Monday July 18 2024 13:58:58 EST Ventricular Rate:  88 PR Interval:  214 QRS Duration:  118 QT Interval:  388 QTC Calculation: 469 R Axis:   54  Text Interpretation: Sinus rhythm with 1st degree A-V block Incomplete left bundle branch block ST changes similar to prior , likely early repolarization When compared with ECG of 07-Aug-2023 14:56, No significant change was found Confirmed by Daneen Perkins ([id number]) on 07/18/2024 2:04:54 PM  - no acute changes.   Lab Results  Component Value Date   WBC 7.4 04/05/2024   HGB 13.5 04/05/2024   HCT 40.9 04/05/2024   MCV 91.5 04/05/2024   PLT 240.0 04/05/2024   Lab Results  Component Value Date   CREATININE 1.12 04/05/2024   BUN 17 04/05/2024   NA 139 04/05/2024    K 3.7 04/05/2024   CL 103 04/05/2024   CO2 29 04/05/2024   Lab Results  Component Value Date   ALT 33 04/05/2024   AST 18 04/05/2024   ALKPHOS 48 04/05/2024   BILITOT 0.8 04/05/2024   Lab Results  Component Value Date   CHOL 107 04/05/2024   HDL 27.10 (L) 04/05/2024   LDLCALC 63 04/05/2024   TRIG 82.0 04/05/2024   CHOLHDL 4 04/05/2024    Lab Results  Component Value Date   HGBA1C 6.1 04/05/2024  Assessment & Plan   1. Heart failure with improved EF: He has a history of heart failure with reduced EF, prior EF 30 to 35% in 2015.  Most recent echocardiogram in 03/2019 showed EF improved to 55 to 60%, normal LV function, moderate LVH, diastolic dysfunction impaired relaxation), normal RV systolic function, no significant valvular abnormalities. Stable with no anginal symptoms. No indication for ischemic evaluation. Euvolemic and well compensated on exam.  Continue carvedilol , Imdur , Entresto , spironolactone , Lasix  as needed.  2. Hypertension: BP well controlled. Continue current antihypertensive regimen.   3. Hyperlipidemia: LDL was 63 in 03/2024.  Continue Lipitor.   4. Type 2 diabetes: A1c was 6.1 in 03/2024.  Monitored and managed per PCP.  Encouraged ongoing lifestyle modifications with diet and exercise.  5. OSA: Adherent to CPAP.   6. Disposition: Follow-up in 1 year.  Patient states Dr. Alveta recommended he establish with Dr. Francyne upon his retirement.      Damien JAYSON Braver, NP 07/18/2024, 2:48 PM       [1] No Known Allergies  "

## 2024-07-26 ENCOUNTER — Other Ambulatory Visit: Payer: Self-pay | Admitting: Family

## 2024-07-26 MED ORDER — MELOXICAM 15 MG PO TABS: 15.0000 mg | ORAL_TABLET | Freq: Every day | ORAL | 0 refills | Status: AC

## 2024-09-23 ENCOUNTER — Ambulatory Visit: Admitting: Adult Health
